# Patient Record
Sex: Male | Born: 1970
Health system: Southern US, Community
[De-identification: ages and names within clinical notes are randomized; demographics above are authoritative.]

## PROBLEM LIST (undated history)

## (undated) DIAGNOSIS — I10 Essential (primary) hypertension: Secondary | ICD-10-CM

## (undated) DIAGNOSIS — M25511 Pain in right shoulder: Secondary | ICD-10-CM

## (undated) DIAGNOSIS — E785 Hyperlipidemia, unspecified: Secondary | ICD-10-CM

## (undated) DIAGNOSIS — S134XXA Sprain of ligaments of cervical spine, initial encounter: Secondary | ICD-10-CM

## (undated) DIAGNOSIS — E669 Obesity, unspecified: Secondary | ICD-10-CM

## (undated) DIAGNOSIS — M25512 Pain in left shoulder: Secondary | ICD-10-CM

## (undated) DIAGNOSIS — E119 Type 2 diabetes mellitus without complications: Secondary | ICD-10-CM

## (undated) DIAGNOSIS — M549 Dorsalgia, unspecified: Secondary | ICD-10-CM

## (undated) DIAGNOSIS — M79671 Pain in right foot: Secondary | ICD-10-CM

## (undated) HISTORY — DX: Essential (primary) hypertension: I10

## (undated) HISTORY — DX: Type 2 diabetes mellitus without complications: E11.9

## (undated) HISTORY — DX: Pain in right foot: M79.671

## (undated) HISTORY — PX: OTHER SURGICAL HISTORY: SHX169

## (undated) HISTORY — DX: Dorsalgia, unspecified: M54.9

## (undated) HISTORY — DX: Hyperlipidemia, unspecified: E78.5

## (undated) HISTORY — DX: Obesity, unspecified: E66.9

## (undated) HISTORY — DX: Pain in left shoulder: M25.512

## (undated) HISTORY — PX: MOUTH SURGERY: SHX715

## (undated) HISTORY — DX: Sprain of ligaments of cervical spine, initial encounter: S13.4XXA

## (undated) HISTORY — DX: Pain in right shoulder: M25.511

---

## 2009-02-16 ENCOUNTER — Ambulatory Visit (HOSPITAL_COMMUNITY): Admission: RE | Admit: 2009-02-16 | Discharge: 2009-02-16 | Payer: Self-pay | Admitting: Family Medicine

## 2009-02-16 ENCOUNTER — Ambulatory Visit: Payer: Self-pay | Admitting: Family Medicine

## 2009-02-16 ENCOUNTER — Encounter: Payer: Self-pay | Admitting: Orthopedic Surgery

## 2009-02-16 DIAGNOSIS — M79609 Pain in unspecified limb: Secondary | ICD-10-CM | POA: Insufficient documentation

## 2009-02-16 DIAGNOSIS — E669 Obesity, unspecified: Secondary | ICD-10-CM | POA: Insufficient documentation

## 2009-02-16 DIAGNOSIS — N529 Male erectile dysfunction, unspecified: Secondary | ICD-10-CM

## 2009-02-16 DIAGNOSIS — M25519 Pain in unspecified shoulder: Secondary | ICD-10-CM

## 2009-02-16 DIAGNOSIS — M25511 Pain in right shoulder: Secondary | ICD-10-CM | POA: Insufficient documentation

## 2009-02-16 HISTORY — DX: Male erectile dysfunction, unspecified: N52.9

## 2009-02-16 LAB — CONVERTED CEMR LAB: Glucose, Bld: 79 mg/dL

## 2009-02-16 IMAGING — CR DG FOOT COMPLETE 3+V*R*
3 series · 3 of 3 positions shown · non-contrast
Comparison: None

CLINICAL DATA: Right foot and bilateral shoulder pain

RIGHT FOOT COMPLETE - 3+ VIEW

[view not recorded (1 of 3)]
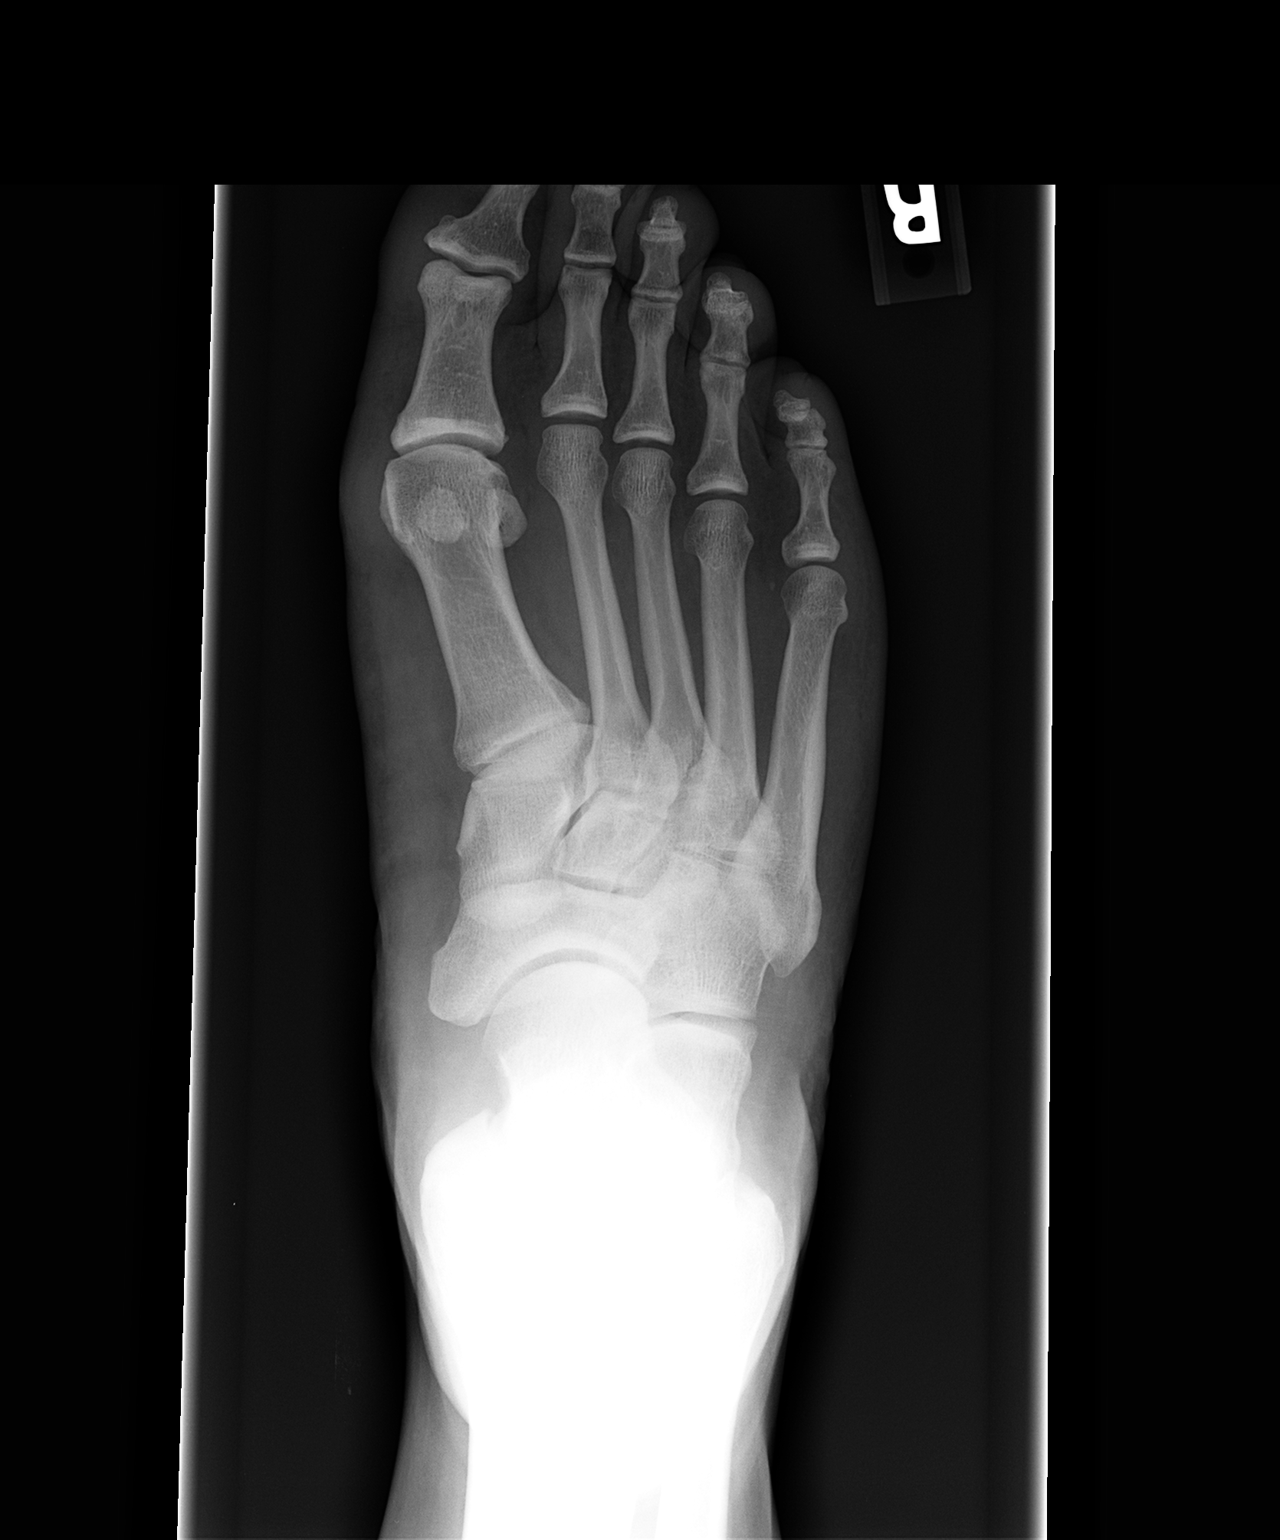

[view not recorded (2 of 3)]
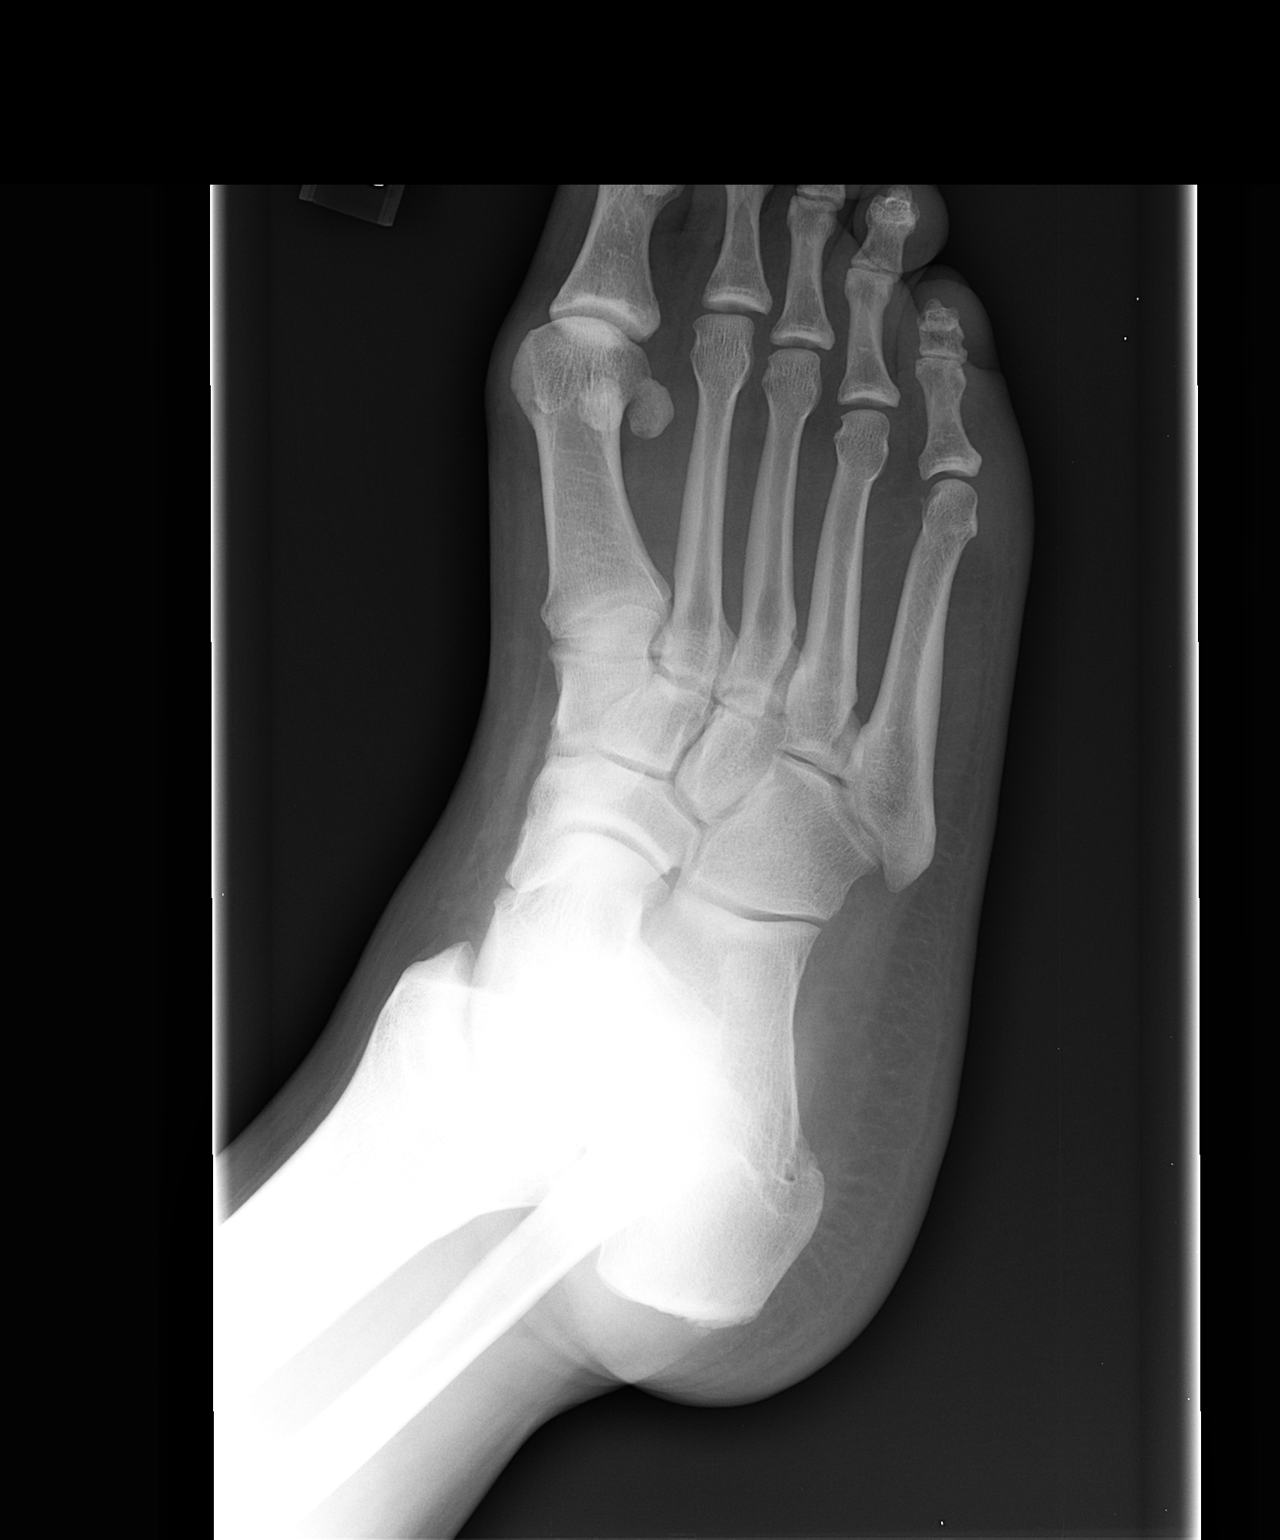

[view not recorded (3 of 3)]
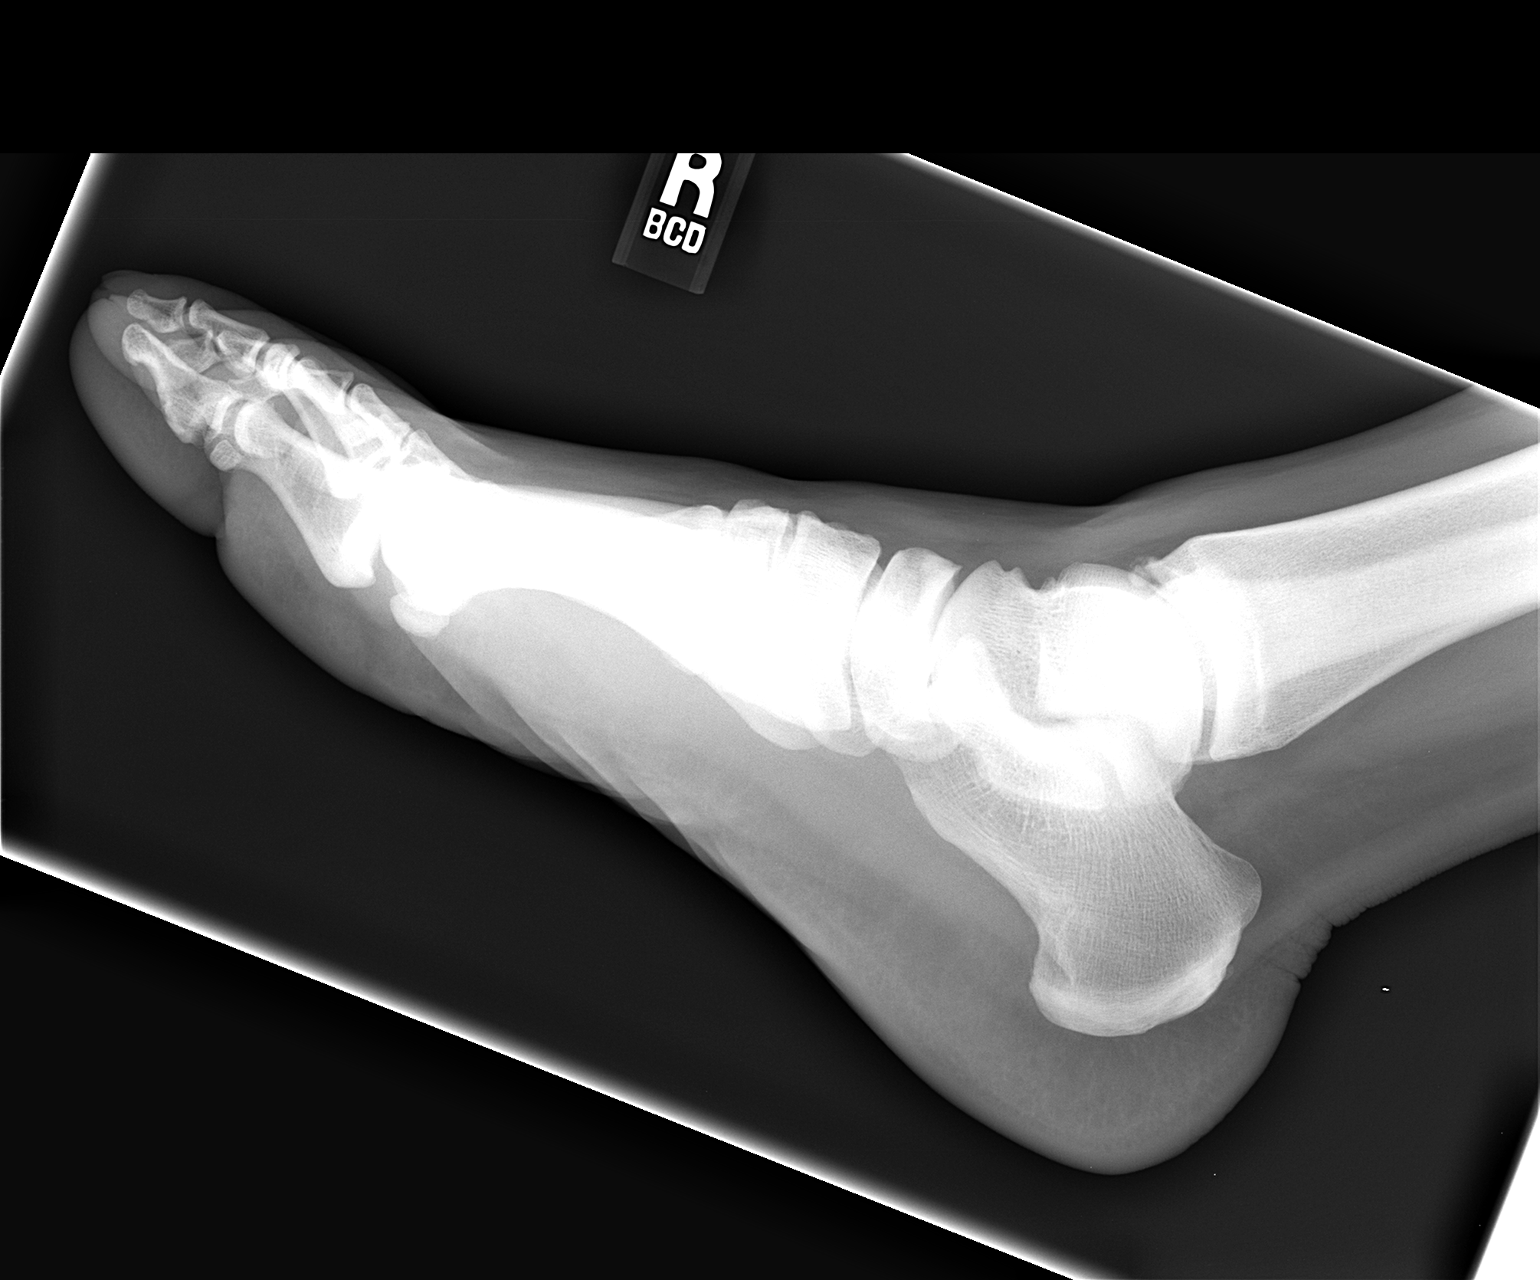

[3 of 3 positions shown; findings below may reference images not displayed]

FINDINGS: Bone mineralization normal.
Joint spaces preserved.
No acute fracture, dislocation, or bone destruction.
Minimal hallux valgus.
IMPRESSION: No acute abnormalities.

## 2009-02-16 IMAGING — CR DG SHOULDER 2+V BILAT
6 series · 6 of 6 positions shown · non-contrast
Comparison: None

CLINICAL DATA: Right foot and bilateral shoulder pain

BILATERAL SHOULDER - 2+ VIEW

[view not recorded (1 of 6)]
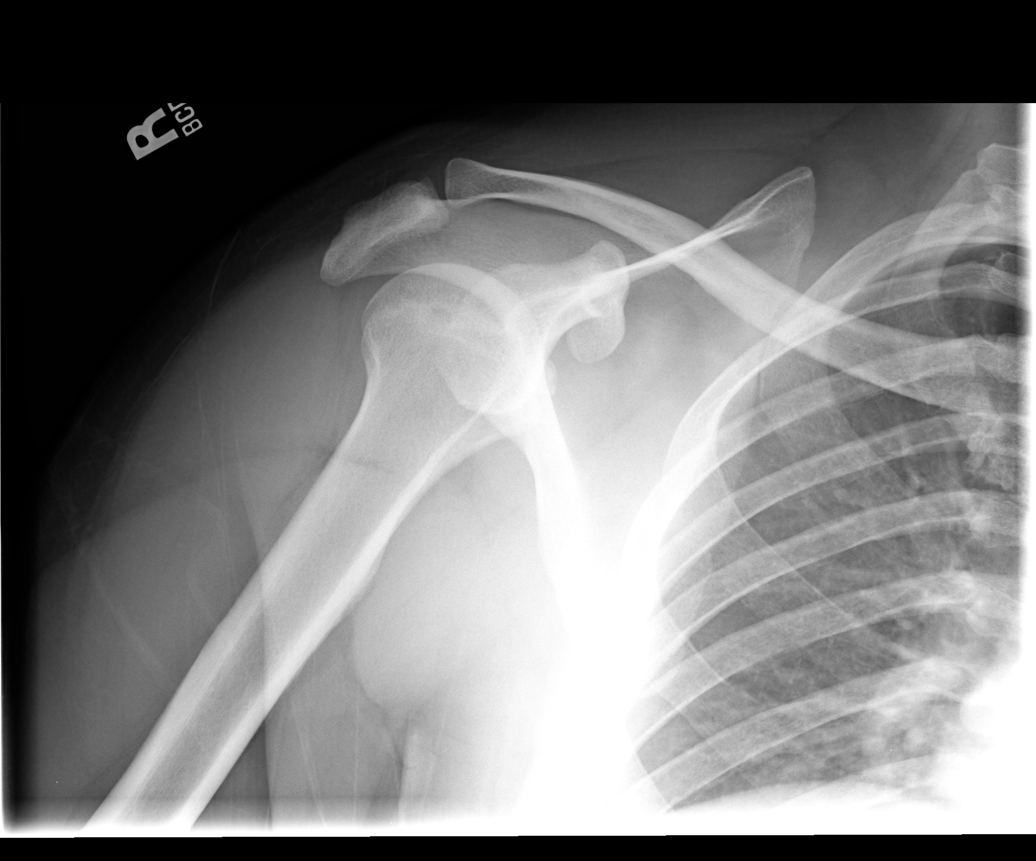

[view not recorded (2 of 6)]
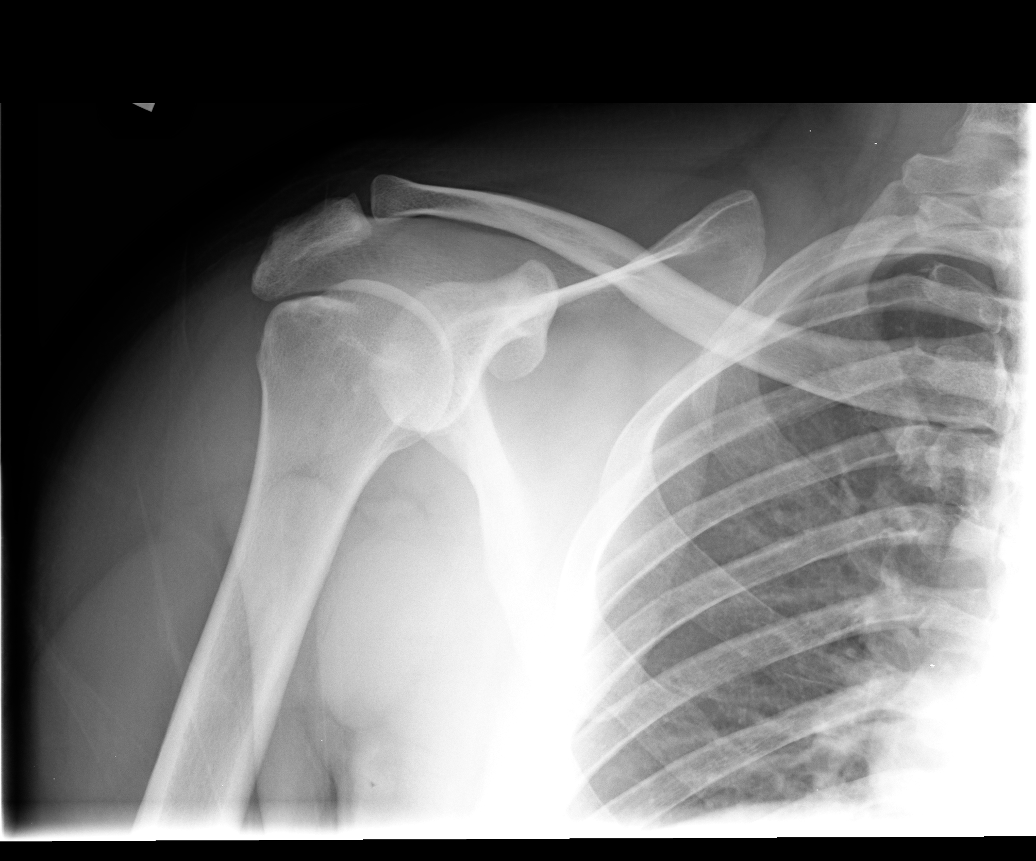

[view not recorded (3 of 6)]
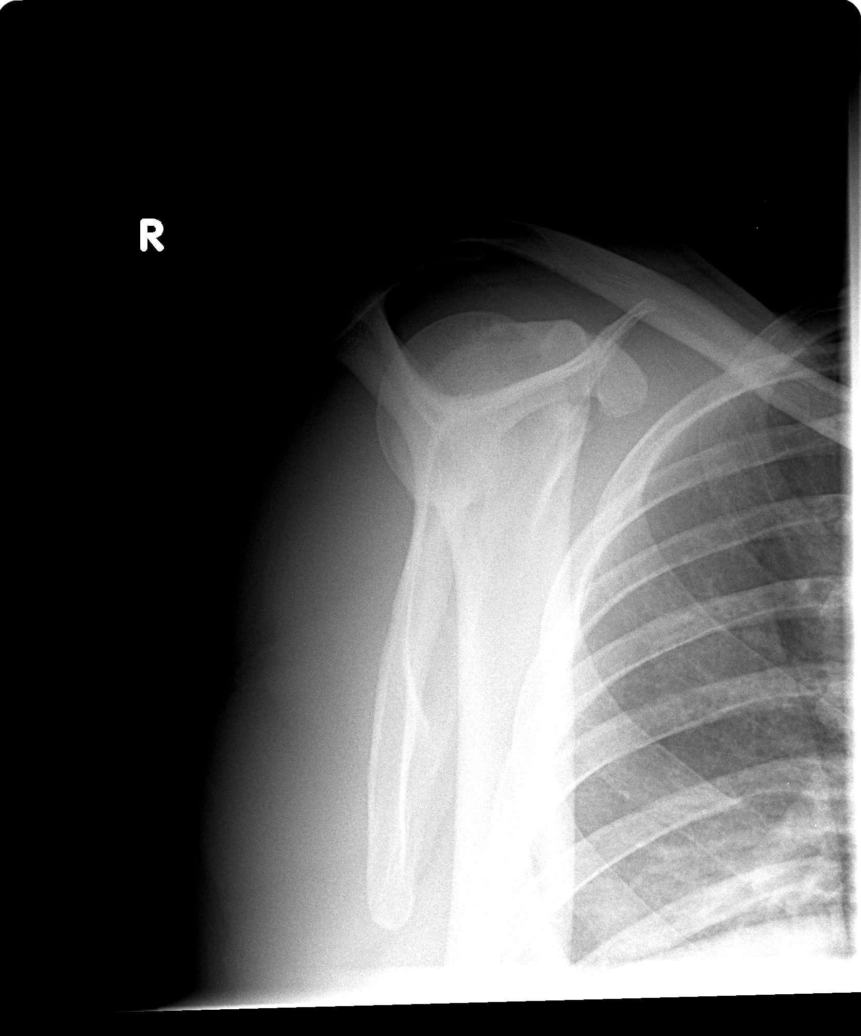

[view not recorded (4 of 6)]
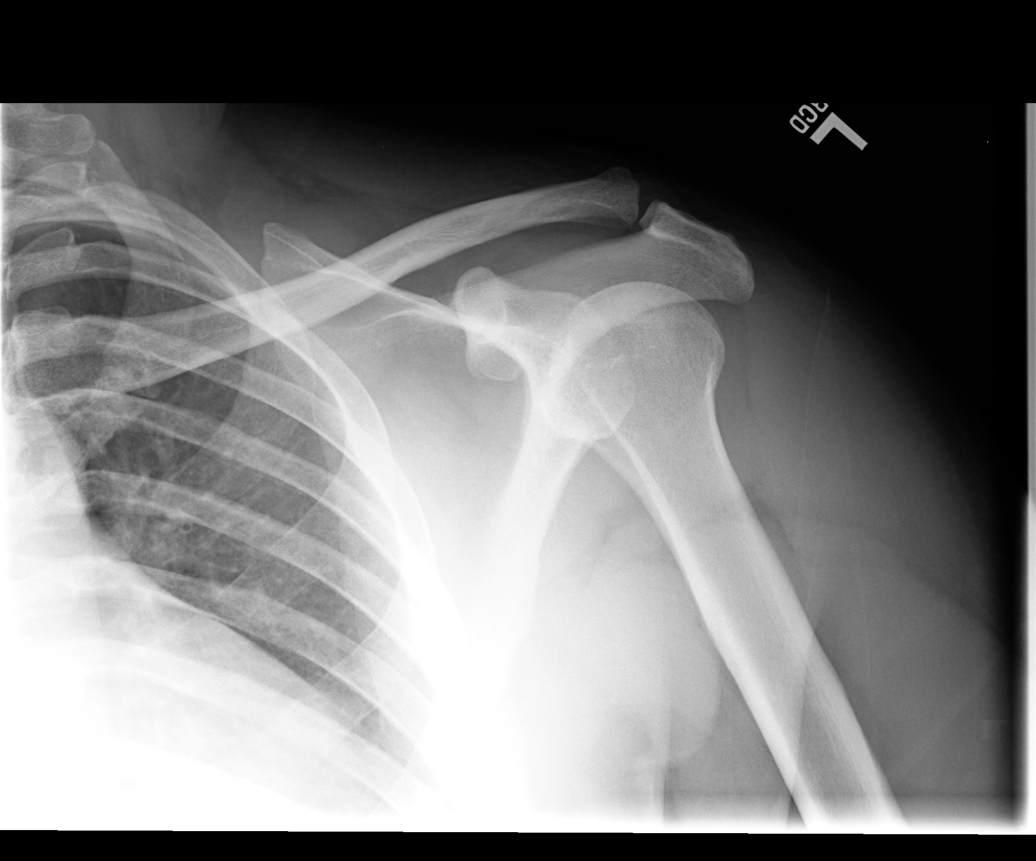

[view not recorded (5 of 6)]
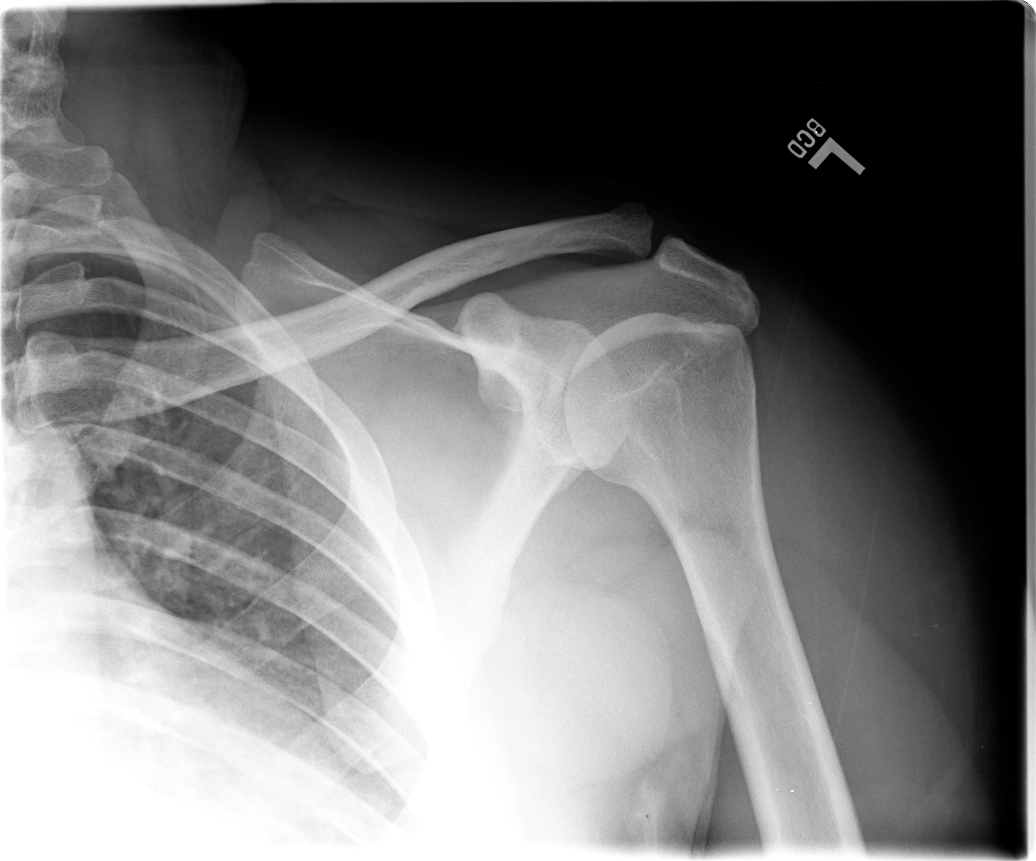

[view not recorded (6 of 6)]
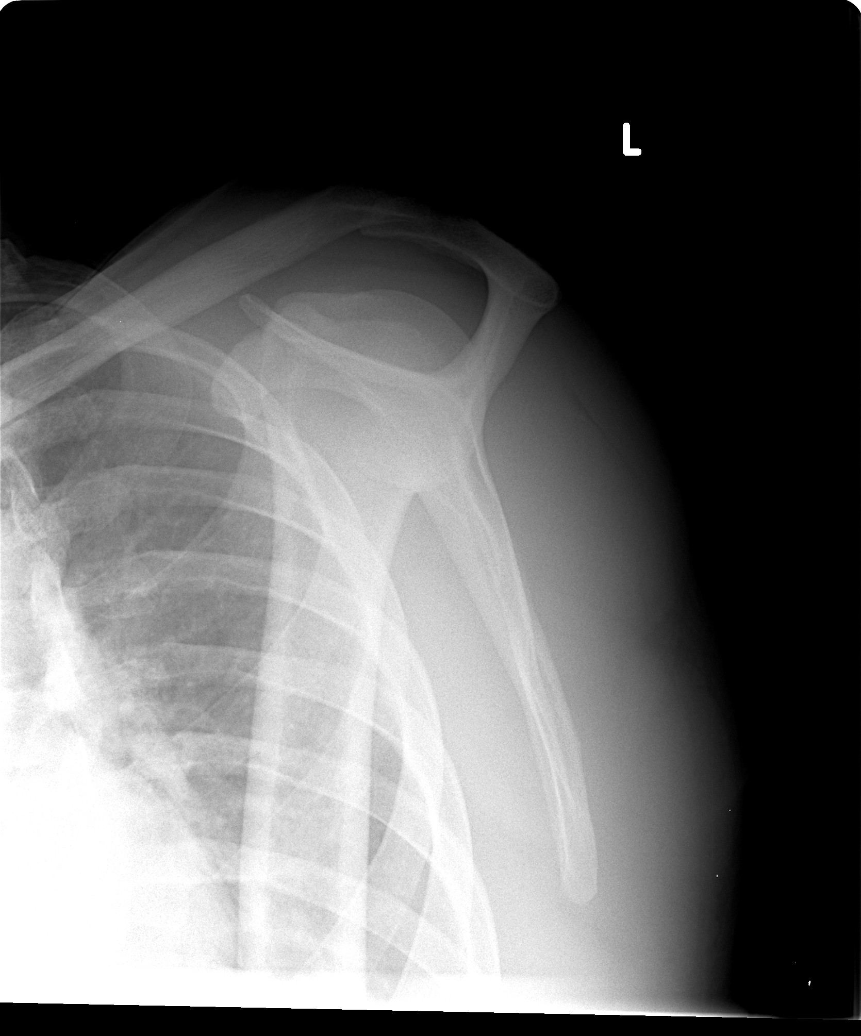

[6 of 6 positions shown; findings below may reference images not displayed]

FINDINGS: Right shoulder three views:
Lateral downsloping acromion.
Bone mineralization normal.
AC joint alignment normal.
No acute fracture, dislocation, or bone destruction.

Left shoulder three views:
Lateral downsloping of acromion.
Bone mineralization normal.
No acute fracture, dislocation, or bone destruction.
IMPRESSION: No acute bony abnormalities.

## 2009-02-18 ENCOUNTER — Encounter: Payer: Self-pay | Admitting: Family Medicine

## 2009-02-19 ENCOUNTER — Encounter: Payer: Self-pay | Admitting: Family Medicine

## 2009-02-23 ENCOUNTER — Encounter: Payer: Self-pay | Admitting: Family Medicine

## 2009-02-23 LAB — CONVERTED CEMR LAB
Basophils Absolute: 0 10*3/uL (ref 0.0–0.1)
Calcium: 9.8 mg/dL (ref 8.4–10.5)
Chloride: 103 meq/L (ref 96–112)
Cholesterol: 260 mg/dL — ABNORMAL HIGH (ref 0–200)
Creatinine, Ser: 1.03 mg/dL (ref 0.40–1.50)
Eosinophils Absolute: 0.1 10*3/uL (ref 0.0–0.7)
HDL: 39 mg/dL — ABNORMAL LOW (ref >39)
LDL Cholesterol: 187 mg/dL — ABNORMAL HIGH (ref 0–99)
Lymphs Abs: 1.5 10*3/uL (ref 0.7–4.0)
MCV: 82.2 fL (ref 78.0–100.0)
Neutrophils Relative %: 63 % (ref 43–77)
Platelets: 233 10*3/uL (ref 150–400)
RDW: 14.8 % (ref 11.5–15.5)
Triglycerides: 169 mg/dL — ABNORMAL HIGH (ref <150)
WBC: 6.4 10*3/uL (ref 4.0–10.5)

## 2009-03-02 ENCOUNTER — Telehealth: Payer: Self-pay | Admitting: Family Medicine

## 2009-03-02 LAB — CONVERTED CEMR LAB
ALT: 33 units/L (ref 0–53)
AST: 25 units/L (ref 0–37)
Alkaline Phosphatase: 43 units/L (ref 39–117)
Bilirubin, Direct: 0.1 mg/dL (ref 0.0–0.3)
Indirect Bilirubin: 0.4 mg/dL (ref 0.0–0.9)

## 2009-03-23 ENCOUNTER — Ambulatory Visit: Payer: Self-pay | Admitting: Family Medicine

## 2009-03-23 DIAGNOSIS — E785 Hyperlipidemia, unspecified: Secondary | ICD-10-CM

## 2009-03-23 DIAGNOSIS — E782 Mixed hyperlipidemia: Secondary | ICD-10-CM | POA: Insufficient documentation

## 2009-06-21 ENCOUNTER — Ambulatory Visit: Payer: Self-pay | Admitting: Family Medicine

## 2009-06-22 ENCOUNTER — Telehealth: Payer: Self-pay | Admitting: Family Medicine

## 2009-06-30 ENCOUNTER — Telehealth: Payer: Self-pay | Admitting: Family Medicine

## 2009-07-20 ENCOUNTER — Ambulatory Visit: Payer: Self-pay | Admitting: Orthopedic Surgery

## 2009-07-21 ENCOUNTER — Ambulatory Visit: Payer: Self-pay | Admitting: Family Medicine

## 2009-07-21 ENCOUNTER — Ambulatory Visit: Payer: Self-pay | Admitting: Orthopedic Surgery

## 2009-07-21 DIAGNOSIS — M25819 Other specified joint disorders, unspecified shoulder: Secondary | ICD-10-CM | POA: Insufficient documentation

## 2009-07-21 DIAGNOSIS — M7580 Other shoulder lesions, unspecified shoulder: Secondary | ICD-10-CM

## 2009-07-21 HISTORY — DX: Other specified joint disorders, unspecified shoulder: M25.819

## 2009-08-25 ENCOUNTER — Ambulatory Visit: Payer: Self-pay | Admitting: Family Medicine

## 2009-08-25 ENCOUNTER — Encounter: Payer: Self-pay | Admitting: Physician Assistant

## 2009-09-29 ENCOUNTER — Ambulatory Visit: Payer: Self-pay | Admitting: Family Medicine

## 2009-10-06 LAB — CONVERTED CEMR LAB
BUN: 12 mg/dL (ref 6–23)
CO2: 21 meq/L (ref 19–32)
Calcium: 9.6 mg/dL (ref 8.4–10.5)
Chloride: 105 meq/L (ref 96–112)
Cholesterol: 248 mg/dL — ABNORMAL HIGH (ref 0–200)
Creatinine, Ser: 1.01 mg/dL (ref 0.40–1.50)
Glucose, Bld: 91 mg/dL (ref 70–99)
Total Bilirubin: 0.6 mg/dL (ref 0.3–1.2)
Total CHOL/HDL Ratio: 6.7
Triglycerides: 94 mg/dL (ref <150)
VLDL: 19 mg/dL (ref 0–40)

## 2010-07-08 ENCOUNTER — Telehealth: Payer: Self-pay | Admitting: Family Medicine

## 2010-07-18 ENCOUNTER — Ambulatory Visit
Admission: RE | Admit: 2010-07-18 | Discharge: 2010-07-18 | Payer: Self-pay | Source: Home / Self Care | Attending: Family Medicine | Admitting: Family Medicine

## 2010-07-18 ENCOUNTER — Encounter: Payer: Self-pay | Admitting: Family Medicine

## 2010-07-18 DIAGNOSIS — E1165 Type 2 diabetes mellitus with hyperglycemia: Secondary | ICD-10-CM | POA: Insufficient documentation

## 2010-07-20 ENCOUNTER — Encounter: Payer: Self-pay | Admitting: Family Medicine

## 2010-07-27 LAB — CONVERTED CEMR LAB
BUN: 14 mg/dL (ref 6–23)
Chloride: 103 meq/L (ref 96–112)
Glucose, Bld: 122 mg/dL — ABNORMAL HIGH (ref 70–99)
Hgb A1c MFr Bld: 10.5 % — ABNORMAL HIGH (ref <5.7)
Potassium: 4.3 meq/L (ref 3.5–5.3)
Sodium: 137 meq/L (ref 135–145)
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
## 2010-08-09 NOTE — Assessment & Plan Note (Signed)
Summary: PHY   Allergies: No Known Drug Allergies   Complete Medication List: 1)  Lovastatin 40 Mg Tabs (Lovastatin) .... Take 1 tab by mouth at bedtime 2)  Vicodin 5-500 Mg Tabs (Hydrocodone-acetaminophen) .... Take 1 tablet by mouth two times a day as needed pt was not evaluated in the office on this day
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
## 2010-08-09 NOTE — Letter (Signed)
Summary: Out of Work  [facility]  [address]   [place], [place] [address]   Phone: [phone]  Fax: [phone]    August 25, 2009   Employee:  JORDI LACKO    To Whom It May Concern:   For Medical reasons, please excuse the above named employee from work for the following dates:  Start:   08/23/09  End:   08/27/09 may return to work without restrictions.  If you need additional information, please feel free to contact our office.         Sincerely,    Esperanza Sheets PA

## 2010-08-09 NOTE — Letter (Signed)
Summary: History form  History form   Imported By: Jacklynn Ganong 07/26/2009 11:40:01  _____________________________________________________________________  External Attachment:    Type:   Image     Comment:   External Document

## 2010-08-09 NOTE — Assessment & Plan Note (Signed)
Summary: BILAT SHOULDER PAIN LT>RT/?NEW XRAYS/REF M.SIMPSON/[employer]   Vital Signs:  Patient profile:   40 year old male Weight:      234 pounds  Visit Type:  Initial     Primary Provider:  simpson, margaret, md  CC:  left shoulder pain.  History of Present Illness: Dr. Syliva Overman has referred the patient to [place] with LEFT and RIGHT shoulder pain for 6 months with no injury.  The LEFT is worse than the RIGHT.  There is painful range of motion and pain at night.  There's a lot of stiffness and popping but no catching or locking he describes a severe pain with burning but no numbness.  The pain radiates from the shoulders into the deltoid.  Note neck pain at this time.  Review of systems is negative for weight loss chest pain shortness of breath nausea hematuria dizziness weakness polydipsia polyphagia anxiety eczema or vision seasonal ALLERGIES or lymph node disease.  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  He smokes once in a while K. she will use of alcohol drinks a lot of coffee.  He is currently enrolled to get his GED.    Allergies: No Known Drug Allergies  Physical Exam  Msk:  Vital signs are stable weight was 234 pulse is 80 respiratory rate was 16  He is awake alert and ointment x3 his appearance was of a large male muscular build.  His mood and affect are normal.  Normal gait and station.  He had normal pulses in his upper extremities with no lymphedema or lymph nodes.  Upper extremity sensation was normal reflexes were 2+ and equal at the elbows and wrist.  Shoulder examination revealed bilateral tenderness over the posterior subacromial space anterolateral acromion and deltoid.  Full range of motion passively and actively with painful range of motion in the arc of 120 up to 180.  A positive  impingement sign.  Shoulders were stable his rotator cuff was normal in strength     Impression & Recommendations:  Problem # 1:  IMPINGEMENT SYNDROME (ICD-726.2) Assessment New  bilateral shoulder injections first on the RIGHT Verbal consent obtained/The shoulder was injected with depomedrol 40mg /cc and sensorcaine .25% . There were no complications  Then on the LEFT  Verbal consent obtained/The shoulder was injected with depomedrol 40mg /cc and sensorcaine .25% . There were no complications   X-rays of the more symptomatic LEFT shoulder  Normal glenohumeral joint normal acromion  Impression normal shoulder  Orders: Est. Patient Level IV (91478) Joint Aspirate / Injection, Large (20610) Depo- Medrol 40mg  (J1030)  Patient Instructions: 1)  You have received an injection of cortisone today. You may experience increased pain at the injection site. Apply ice pack to the area for 20 minutes every 2 hours and take 2 xtra strength tylenol every 8 hours. This increased pain will usually resolve in 24 hours. The injection will take effect in 3-10 days.  2)  rest 3)  Limit activity to comfort and avoid activities that increase discomfort.  Apply moist heat and/or ice to shoulder as needed

## 2010-08-11 NOTE — Letter (Signed)
Summary: DOSE INCREASE  DOSE INCREASE   Imported By: Lind Guest 07/18/2010 16:37:26  _____________________________________________________________________  External Attachment:    Type:   Image     Comment:   External Document

## 2010-08-11 NOTE — Letter (Signed)
Summary: [facility] patient instructions  [facility] patient instructions   Imported By: Lind Guest 07/20/2010 09:11:35  _____________________________________________________________________  External Attachment:    Type:   Image     Comment:   External Document

## 2010-08-11 NOTE — Assessment & Plan Note (Signed)
Summary: f up from ed found out he has diab.   Vital Signs:  Patient profile:   40 year old male Height:      68 inches Weight:      243.25 pounds BMI:     37.12 O2 Sat:      97 % Pulse rate:   87 / minute Pulse rhythm:   regular Resp:     16 per minute BP sitting:   104 / 74  (left arm) Cuff size:   large  Vitals Entered By: Everitt Amber LPN (July 18, 2010 11:02 AM)  Nutrition Counseling: Patient's BMI is greater than 25 and therefore counseled on weight management options. CC: wentt o [facility] ER on 12/30 and found out he was a diabetic   Primary Care Provider:  simpson, margaret, md  CC:  wentt o [facility] ER on 12/30 and found out he was a diabetic.  History of Present Illness: Pt recently dx as new diabetic in the Ed, he had polyuria, polydypsia, blurred vision and dry mouth. he has been a known prediabetic for some time, but has not f/u as recommended.  Current Medications (verified): 1)  Metformin Hcl 500 Mg Tabs (Metformin Hcl) .... Take 1 Tablet By Mouth Two Times A Day  Allergies (verified): No Known Drug Allergies  Review of Systems      See HPI General:  Complains of fatigue; denies chills and fever. Eyes:  Complains of blurring. ENT:  Denies hoarseness, nasal congestion, postnasal drainage, and sinus pressure. CV:  Denies chest pain or discomfort, palpitations, and swelling of feet. Resp:  Denies cough and sputum productive. GI:  Denies abdominal pain, constipation, diarrhea, nausea, and vomiting. GU:  Complains of nocturia and urinary frequency; denies dysuria. MS:  Denies joint pain, low back pain, mid back pain, and stiffness. Neuro:  Denies difficulty with concentration, disturbances in coordination, headaches, and memory loss. Psych:  Denies anxiety and depression. Endo:  Complains of excessive thirst and excessive urination. Heme:  Denies abnormal bruising and bleeding.  Physical Exam  General:  Well-developed,obese,in no  acute distress; alert,appropriate and cooperative throughout examination HEENT: No facial asymmetry,  EOMI, No sinus tenderness, TM's Clear, oropharynx  pink and moist.   Chest: Clear to auscultation bilaterally.  CVS: S1, S2, No murmurs, No S3.   Abd: Soft, Nontender.  MS: Adequate ROM spine, hips, shoulders and knees.  Ext: No edema.   CNS: CN 2-12 intact, power tone and sensation normal throughout.   Skin: Intact, no visible lesions or rashes.  Psych: Good eye contact, normal affect.  Memory intact, not anxious or depressed appearing.    Impression & Recommendations:  Problem # 1:  DIABETES MELLITUS, TYPE II (ICD-250.00) Assessment Comment Only  His updated medication list for this problem includes:    Metformin Hcl 1000 Mg Tabs (Metformin hcl) .Marland Kitchen... Take 1 tablet by mouth two times a day    Glipizide 5 Mg Xr24h-tab (Glipizide) .Marland Kitchen... Take 1 tablet by mouth once a day new diabetic, needs HBA1C and educated in the office re testing and diet  Problem # 2:  HYPERLIPIDEMIA (ICD-272.4) Assessment: Comment Only  The following medications were removed from the medication list:    Crestor 20 Mg Tabs (Rosuvastatin calcium) .Marland Kitchen... Take 1 daily for cholesterol  Labs Reviewed: SGOT: 25 (09/29/2009)   SGPT: 27 (09/29/2009)   HDL:37 (09/29/2009), 39 (02/18/2009)  LDL:192 (09/29/2009), 187 (02/18/2009)  Chol:248 (09/29/2009), 260 (02/18/2009)  Trig:94 (09/29/2009), 169 (02/18/2009) Low fat dietdiscussed and  encouraged Needs rept labs  Complete Medication List: 1)  Metformin Hcl 1000 Mg Tabs (Metformin hcl) .... Take 1 tablet by mouth two times a day 2)  Glipizide 5 Mg Xr24h-tab (Glipizide) .... Take 1 tablet by mouth once a day  Other Orders: T-Basic Metabolic Panel [id number]) T- Hemoglobin A1C ([id number]) Glucose, (CBG) (95284)  Patient Instructions: 1)  F/U in 6 weeks 2)  Check your blood sugars regularly. If your readings are usually above 250 or below 70 you should contact  our office. 3)  It is important that you exercise regularly at least 20 minutes 5 times a week. If you develop chest pain, have severe difficulty breathing, or feel very tired , stop exercising immediately and seek medical attention. 4)  You need to lose weight. Consider a lower calorie diet and regular exercise.  Prescriptions: GLIPIZIDE 5 MG XR24H-TAB (GLIPIZIDE) Take 1 tablet by mouth once a day  #30 x 0   Entered and Authorized by:   Syliva Overman MD   Signed by:   Syliva Overman MD on 07/18/2010   Method used:   Electronically to        [facility] (retail)       [address]       [place], [place]  [address]       Ph: [phone]       Fax: [phone]   RxID:   [id number] METFORMIN HCL 1000 MG TABS (METFORMIN HCL) Take 1 tablet by mouth two times a day  #60 x 3   Entered and Authorized by:   Syliva Overman MD   Signed by:   Syliva Overman MD on 07/18/2010   Method used:   Printed then faxed to ...       [facility] (retail)       [address]       [place], [place]  [address]       Ph: [phone]       Fax: [phone]   RxID:   [id number]    Orders Added: 1)  Est. Patient Level III [76160] 2)  T-Basic Metabolic Panel [phone] 3)  T- Hemoglobin A1C [83036-23375] 4)  Glucose, (CBG) [82962]    Laboratory Results   Blood Tests   Date/Time Received: July 18, 2010 12:04 PM   Date/Time Reported: July 18, 2010 12:04 PM   Glucose (random): 186 mg/dL   (Normal Range: 85-462)

## 2010-08-11 NOTE — Progress Notes (Signed)
  Phone Note Call from Patient   Summary of Call: Patients wife called in and states patient has been using the bathroom a lot here lately.  She states his fingers feel knumb as well.  She also states heart problems, and diabeties run in his family.  I advised patient to take husband to urgent care or ED. Initial call taken by: Curtis Sites,  July 08, 2010 8:10 AM  Follow-up for Phone Call        noted and i agree Follow-up by: Adella Hare LPN,  July 08, 2010 2:21 PM

## 2010-09-01 ENCOUNTER — Encounter: Payer: Self-pay | Admitting: Family Medicine

## 2010-09-01 ENCOUNTER — Ambulatory Visit (INDEPENDENT_AMBULATORY_CARE_PROVIDER_SITE_OTHER): Payer: Self-pay | Admitting: Family Medicine

## 2010-09-01 DIAGNOSIS — E119 Type 2 diabetes mellitus without complications: Secondary | ICD-10-CM

## 2010-09-01 DIAGNOSIS — E669 Obesity, unspecified: Secondary | ICD-10-CM

## 2010-09-01 LAB — CONVERTED CEMR LAB: Glucose, Bld: 123 mg/dL

## 2010-09-15 NOTE — Assessment & Plan Note (Signed)
Summary: F UP 6 WEEKS   Vital Signs:  Patient profile:   40 year old male Height:      68 inches Weight:      240 pounds BMI:     36.62 O2 Sat:      98 % Pulse rate:   74 / minute Pulse rhythm:   regular Resp:     16 per minute BP sitting:   110 / 78  (left arm) Cuff size:   large  Vitals Entered By: Everitt Amber LPN (September 01, 2010 9:27 AM)  Nutrition Counseling: Patient's BMI is greater than 25 and therefore counseled on weight management options. CC: Follow up chronic problems   Primary Care Provider:  simpson, margaret, md  CC:  Follow up chronic problems.  History of Present Illness: Reports  that the is doing much bette. He is following a reduced carb diet,taking meds as prescribed , and his fasting blood suagars are being tesgted daily and are sel;dom over 120 Denies recent fever or chills. Denies sinus pressure, nasal congestion , ear pain or sore throat. Denies chest congestion, or cough productive of sputum. Denies chest pain, palpitations, PND, orthopnea or leg swelling. Denies abdominal pain, nausea, vomitting, diarrhea or constipation. 'Denies change in bowel movements or bloody stool. Denies dysuria , frequency, incontinence or hesitancy.  Denies headaches, vertigo, seizures. Denies depression, anxiety or insomnia. Denies  rash, lesions, or itch.     Current Medications (verified): 1)  Metformin Hcl 1000 Mg Tabs (Metformin Hcl) .... Take 1 Tablet By Mouth Two Times A Day 2)  Glipizide 5 Mg Xr24h-Tab (Glipizide) .... Take 1 Tablet By Mouth Once A Day  Allergies (verified): No Known Drug Allergies  Review of Systems      See HPI General:  Complains of fatigue. Eyes:  Complains of blurring; improved with improved blood sugar control. GU:  Complains of erectile dysfunction. MS:  Complains of joint pain and stiffness; intermittent shoulder pain. Endo:  Denies excessive hunger, excessive thirst, and excessive urination; tests daily fastings, states  they range from the 80's to 90's, out of glipizide since saturday, no sugar lows .  Physical Exam  General:  Well-developed,well-nourished,in no acute distress; alert,appropriate and cooperative throughout examination HEENT: No facial asymmetry,  EOMI, No sinus tenderness, TM's Clear, oropharynx  pink and moist.   Chest: Clear to auscultation bilaterally.  CVS: S1, S2, No murmurs, No S3.   Abd: Soft, Nontender.  MS: Adequate ROM spine, hips, shoulders and knees.  Ext: No edema.   CNS: CN 2-12 intact, power tone and sensation normal throughout.   Skin: Intact, no visible lesions or rashes.  Psych: Good eye contact, normal affect.  Memory intact, not anxious or depressed appearing.    Impression & Recommendations:  Problem # 1:  DIABETES MELLITUS, TYPE II (ICD-250.00) Assessment Improved  The following medications were removed from the medication list:    Metformin Hcl 1000 Mg Tabs (Metformin hcl) .Marland Kitchen... Take 1 tablet by mouth two times a day    Glipizide 5 Mg Xr24h-tab (Glipizide) .Marland Kitchen... Take 1 tablet by mouth once a day His updated medication list for this problem includes:    Metformin Hcl 1000 Mg Tabs (Metformin hcl) .Marland Kitchen... Take 1 tablet by mouth two times a day    Glipizide 5 Mg Xr24h-tab (Glipizide) .Marland Kitchen... Take 1 tablet by mouth once a day Patient advised to reduce carbs and sweets, commit to regular physical activity, take meds as prescribed, test blood sugars as directed, and attempt  to lose weight , to improve blood sugar control.  Orders: Glucose, (CBG) (82962) T- Hemoglobin A1C [phone]) T- Hemoglobin A1C (09811-91478)  Problem # 2:  HYPERLIPIDEMIA (ICD-272.4) Assessment: Comment Only  Labs Reviewed: SGOT: 25 (09/29/2009)   SGPT: 27 (09/29/2009)   HDL:37 (09/29/2009), 39 (02/18/2009)  LDL:192 (09/29/2009), 187 (02/18/2009)  Chol:248 (09/29/2009), 260 (02/18/2009)  Trig:94 (09/29/2009), 169 (02/18/2009) Low fat dietdiscussed and encouraged pt needs meds,but will  need to wait on a more recentpanel to prescribe  Problem # 3:  OBESITY, UNSPECIFIED (ICD-278.00) Assessment: Unchanged  Ht: 68 (09/01/2010)   Wt: 240 (09/01/2010)   BMI: 36.62 (09/01/2010) therapeutic lifestyle change discussed and encouraged  Complete Medication List: 1)  Metformin Hcl 1000 Mg Tabs (Metformin hcl) .... Take 1 tablet by mouth two times a day 2)  Glipizide 5 Mg Xr24h-tab (Glipizide) .... Take 1 tablet by mouth once a day  Other Orders: T-Basic Metabolic Panel [id number]) T-Basic Metabolic Panel [id number])  Patient Instructions: 1)  f/u early august 2)  your blood sugars seem to be doing well. 3)  continue the same meds. 4)  Pls commit daily physical activity for 30 minutes for your health 5)  hBA1C and chem 7 non fasting april 20 or after. 6)  hbaic and chem 7 non fasting , early August , just before follow up Prescriptions: GLIPIZIDE 5 MG XR24H-TAB (GLIPIZIDE) Take 1 tablet by mouth once a day  #30 x 6   Entered and Authorized by:   Syliva Overman MD   Signed by:   Syliva Overman MD on 09/01/2010   Method used:   Electronically to        [facility] [address] (retail)       [address]       [place], [place]  [address]       Ph: [phone]       Fax: [phone]   RxID:   [id number] METFORMIN HCL 1000 MG TABS (METFORMIN HCL) Take 1 tablet by mouth two times a day  #60 x 6   Entered and Authorized by:   Syliva Overman MD   Signed by:   Syliva Overman MD on 09/01/2010   Method used:   Electronically to        [facility] (retail)       [address]       [place], [place]  [address]       Ph: [phone]       Fax: [phone]   RxID:   [id number]    Orders Added: 1)  Est. Patient Level III [15176] 2)  Glucose, (CBG) [82962] 3)  T-Basic Metabolic Panel [[id number]] 4)  T- Hemoglobin A1C [83036-23375] 5)  T-Basic Metabolic Panel [[id number]] 6)  T- Hemoglobin A1C  [83036-23375]    Laboratory Results   Blood Tests     Glucose (random): 123 mg/dL   (Normal Range: 16-073)

## 2011-02-09 ENCOUNTER — Ambulatory Visit: Payer: Self-pay | Admitting: Family Medicine

## 2012-05-13 ENCOUNTER — Emergency Department (HOSPITAL_COMMUNITY): Payer: Self-pay

## 2012-05-13 ENCOUNTER — Emergency Department (HOSPITAL_COMMUNITY)
Admission: EM | Admit: 2012-05-13 | Discharge: 2012-05-13 | Disposition: A | Discharge status: Home / Self Care | Payer: Self-pay | Attending: Emergency Medicine | Admitting: Emergency Medicine

## 2012-05-13 ENCOUNTER — Encounter (HOSPITAL_COMMUNITY): Payer: Self-pay

## 2012-05-13 DIAGNOSIS — R079 Chest pain, unspecified: Secondary | ICD-10-CM | POA: Insufficient documentation

## 2012-05-13 DIAGNOSIS — F17200 Nicotine dependence, unspecified, uncomplicated: Secondary | ICD-10-CM | POA: Insufficient documentation

## 2012-05-13 DIAGNOSIS — S0003XA Contusion of scalp, initial encounter: Secondary | ICD-10-CM | POA: Insufficient documentation

## 2012-05-13 DIAGNOSIS — S1093XA Contusion of unspecified part of neck, initial encounter: Secondary | ICD-10-CM | POA: Insufficient documentation

## 2012-05-13 DIAGNOSIS — S0093XA Contusion of unspecified part of head, initial encounter: Secondary | ICD-10-CM

## 2012-05-13 DIAGNOSIS — Z87828 Personal history of other (healed) physical injury and trauma: Secondary | ICD-10-CM | POA: Insufficient documentation

## 2012-05-13 DIAGNOSIS — E119 Type 2 diabetes mellitus without complications: Secondary | ICD-10-CM | POA: Insufficient documentation

## 2012-05-13 DIAGNOSIS — Y9389 Activity, other specified: Secondary | ICD-10-CM | POA: Insufficient documentation

## 2012-05-13 DIAGNOSIS — Y929 Unspecified place or not applicable: Secondary | ICD-10-CM | POA: Insufficient documentation

## 2012-05-13 DIAGNOSIS — W208XXA Other cause of strike by thrown, projected or falling object, initial encounter: Secondary | ICD-10-CM | POA: Insufficient documentation

## 2012-05-13 LAB — TROPONIN I: Troponin I: <0.3 ng/mL (ref <0.30)

## 2012-05-13 LAB — COMPREHENSIVE METABOLIC PANEL
Alkaline Phosphatase: 62 U/L (ref 39–117)
BUN: 12 mg/dL (ref 6–23)
CO2: 24 mEq/L (ref 19–32)
Chloride: 97 mEq/L (ref 96–112)
Creatinine, Ser: 0.81 mg/dL (ref 0.50–1.35)
GFR calc Af Amer: >90 mL/min (ref >90)
GFR calc non Af Amer: >90 mL/min (ref >90)
Glucose, Bld: 366 mg/dL — ABNORMAL HIGH (ref 70–99)
Potassium: 4 mEq/L (ref 3.5–5.1)
Total Bilirubin: 0.4 mg/dL (ref 0.3–1.2)

## 2012-05-13 LAB — GLUCOSE, CAPILLARY: Glucose-Capillary: 275 mg/dL — ABNORMAL HIGH (ref 70–99)

## 2012-05-13 LAB — CBC WITH DIFFERENTIAL/PLATELET
Basophils Relative: 0 % (ref 0–1)
HCT: 41.6 % (ref 39.0–52.0)
Hemoglobin: 14.4 g/dL (ref 13.0–17.0)
Lymphs Abs: 2.8 10*3/uL (ref 0.7–4.0)
MCH: 27.7 pg (ref 26.0–34.0)
MCHC: 34.6 g/dL (ref 30.0–36.0)
Monocytes Absolute: 0.3 10*3/uL (ref 0.1–1.0)
Monocytes Relative: 6 % (ref 3–12)
Neutro Abs: 2.5 10*3/uL (ref 1.7–7.7)
Neutrophils Relative %: 43 % (ref 43–77)
RBC: 5.19 MIL/uL (ref 4.22–5.81)

## 2012-05-13 IMAGING — CR DG CHEST 2V
2 series · 2 of 2 positions shown · non-contrast
Comparison: [DATE]

CLINICAL DATA: Left-sided chest pain, shortness of breath.

CHEST - 2 VIEW

[view not recorded (1 of 2)]
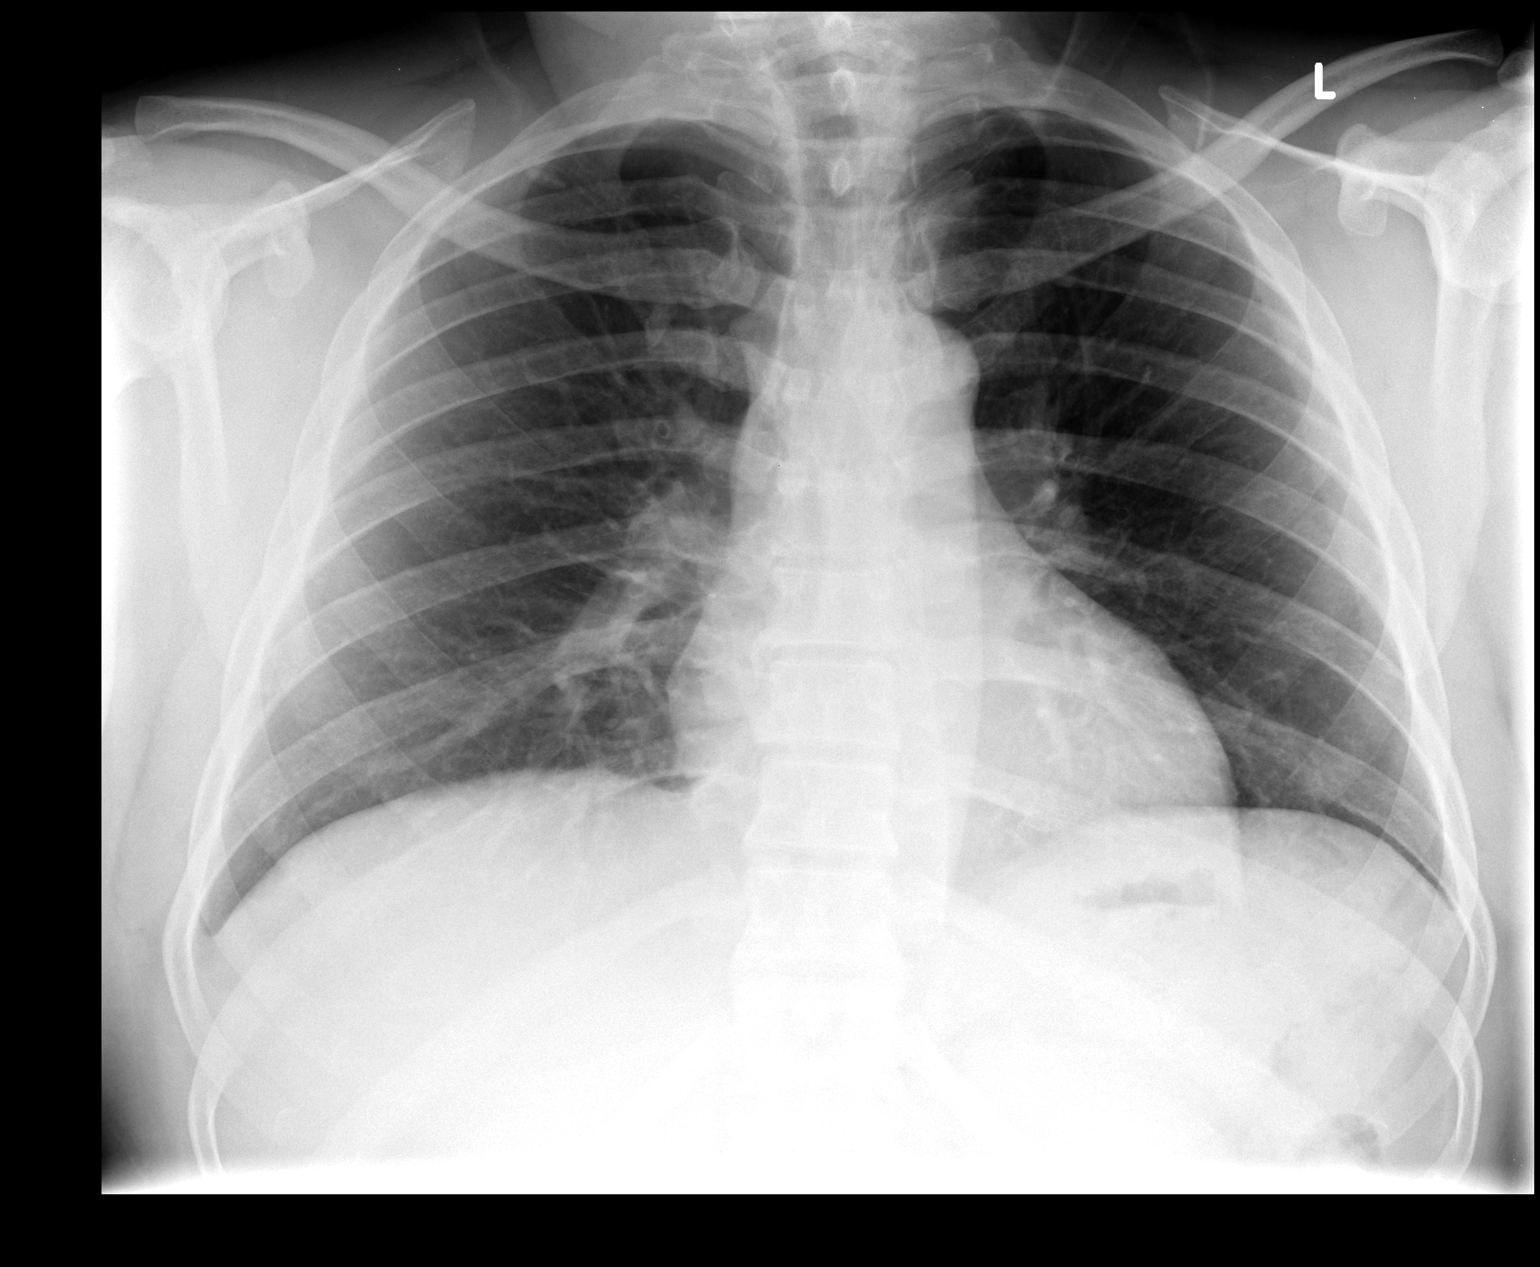

[view not recorded (2 of 2)]
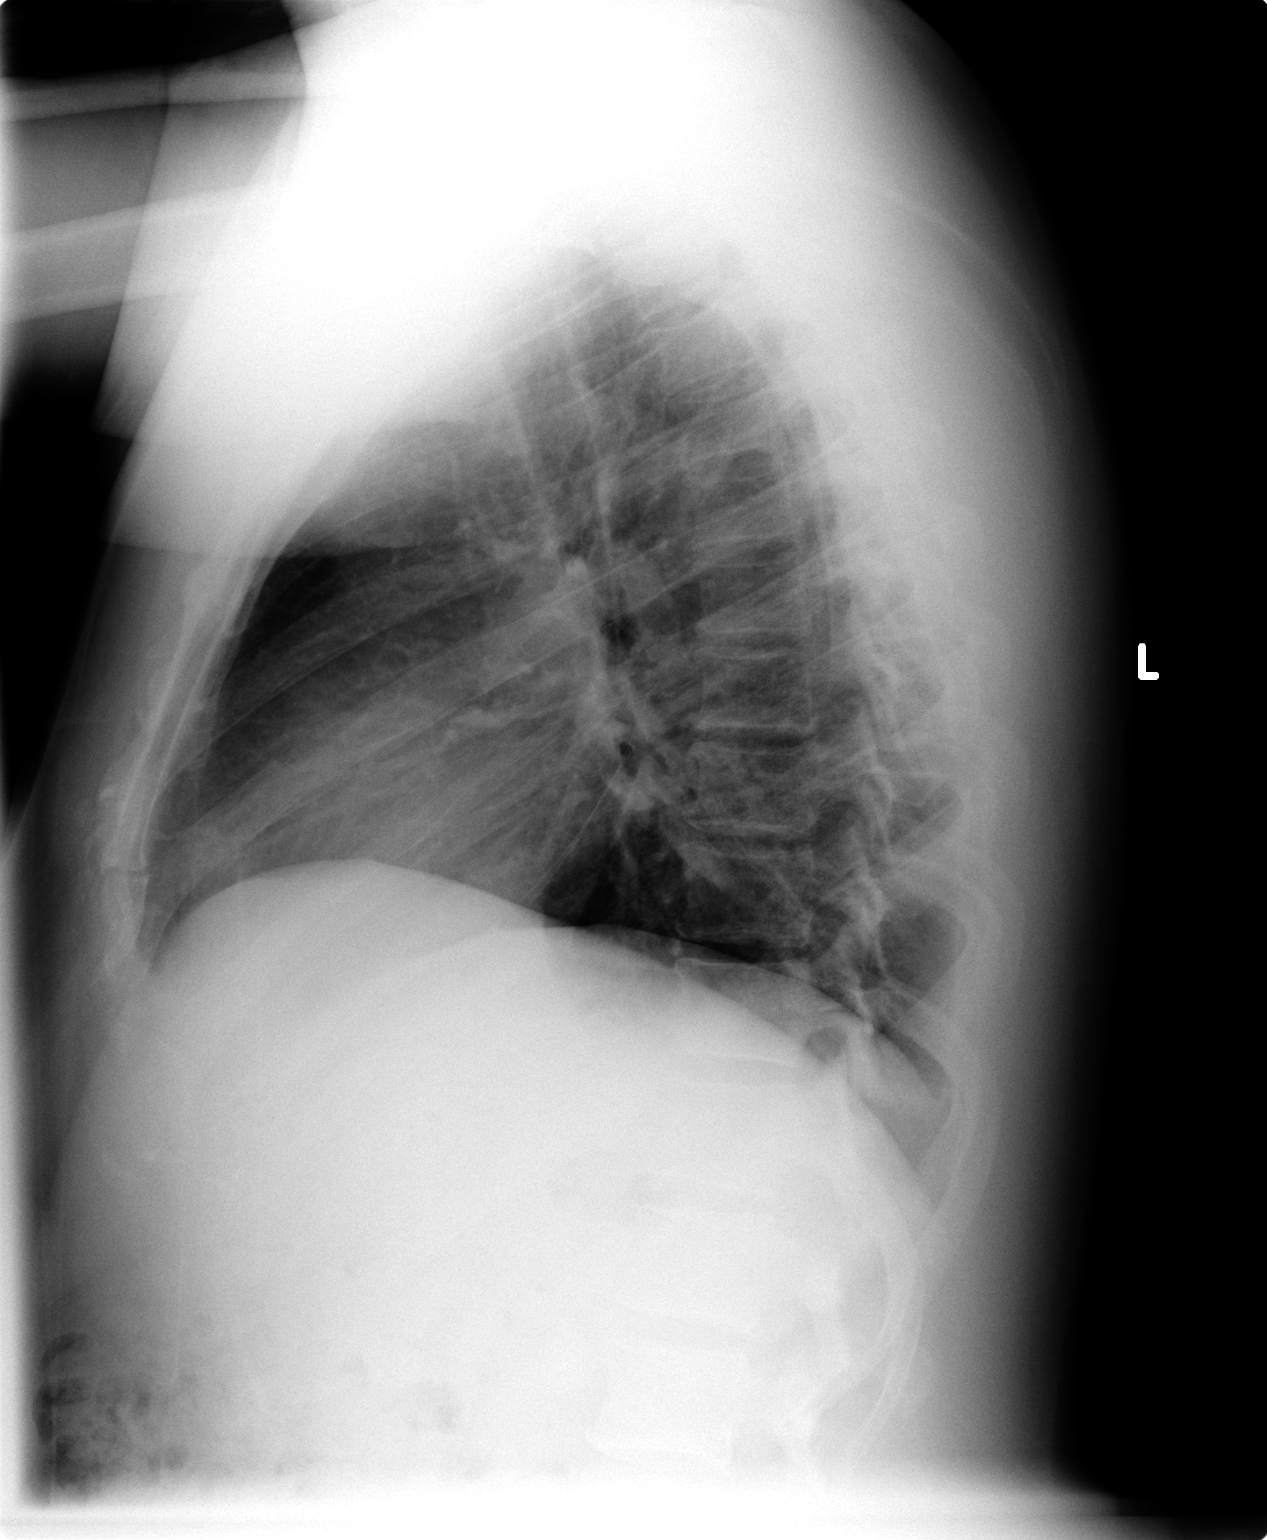

[2 of 2 positions shown; findings below may reference images not displayed]

FINDINGS: Lungs are clear. No pleural effusion or pneumothorax. The
cardiomediastinal contours are within normal limits. The visualized
bones and soft tissues are without significant appreciable
abnormality.
IMPRESSION: No radiographic evidence of acute cardiopulmonary process.
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
## 2012-05-13 MED ORDER — METFORMIN HCL 500 MG PO TABS: 500.0000 mg | ORAL_TABLET | Freq: Every day | ORAL | Status: DC

## 2012-05-13 MED ORDER — SODIUM CHLORIDE 0.9 % IV BOLUS (SEPSIS)
1000.0000 mL | Freq: Once | INTRAVENOUS | Status: AC
  Administered 2012-05-13: 1000 mL via INTRAVENOUS
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
## 2012-05-13 NOTE — ED Notes (Signed)
MD at bedside. 
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
## 2012-10-17 ENCOUNTER — Ambulatory Visit (INDEPENDENT_AMBULATORY_CARE_PROVIDER_SITE_OTHER): Payer: Self-pay | Admitting: Family Medicine

## 2012-10-17 VITALS — BP 110/70 | HR 73 | Resp 16 | Wt 203.1 lb

## 2012-10-17 DIAGNOSIS — E119 Type 2 diabetes mellitus without complications: Secondary | ICD-10-CM

## 2012-10-17 DIAGNOSIS — E785 Hyperlipidemia, unspecified: Secondary | ICD-10-CM

## 2012-10-17 LAB — HEMOGLOBIN A1C: Hgb A1c MFr Bld: 15.9 % — ABNORMAL HIGH (ref <5.7)

## 2012-10-17 LAB — BASIC METABOLIC PANEL
BUN: 11 mg/dL (ref 6–23)
CO2: 28 mEq/L (ref 19–32)
Chloride: 99 mEq/L (ref 96–112)
Creat: 0.98 mg/dL (ref 0.50–1.35)
Glucose, Bld: 272 mg/dL — ABNORMAL HIGH (ref 70–99)
Potassium: 4.3 mEq/L (ref 3.5–5.3)

## 2012-10-17 NOTE — Patient Instructions (Addendum)
F/u in 3 month.  You are an uncontrolled diabetic, fasting sugar was 295 today, it should be less than100  yOU WILL GET AN ACCUCHECK AVIVA METER    PLease get HBA1C AND CHEM 7  TODAY  YOU NEED MEDICATION AND I CAN ONLY START THIS AFTERI SEE YOUR LAB  PLEASE STOP SUGARY DRINKS,STOP CANDY, CAKE, ICE CREAM, COOKIES   EAT A LOT OF VEGETABLES, COLORED, PORTION CONTROL ON STARTCHY FOOD, AND MEAT  PLEASE GO TO DIABETIC ED CLASS, YOU WILL GET A SHEET WITH SCHEDULE, NO NEED TO CALL  It is important that you exercise regularly at least 30 minutes 7 times a week. If you develop chest pain, have severe difficulty breathing, or feel very tired, stop exercising immediately and seek medical attention   fOR SHOULDER PAIN TYLENOL 1 TABLET A T BEDTIME

## 2012-10-17 NOTE — Progress Notes (Signed)
  Subjective:    Patient ID: Travis Delgado, male    DOB: [birth year], 42 y.o.   MRN: [id number]  HPI Pt in for evaluation of weight loss and fatigue. He has been diagnosed with diabetes several years ago and is non compliant with medication, and is uninsured and unfortunately also unemployed. He c/o dry mouth and visual disturbance also C/o bilateral shoulder pain, which is chronic   Review of Systems See HPI Denies recent fever or chills. Denies sinus pressure, nasal congestion, ear pain or sore throat. Denies chest congestion, productive cough or wheezing. Denies chest pains, palpitations and leg swelling Denies abdominal pain, nausea, vomiting,diarrhea or constipation.   Dies have urinary frequency Denies headaches, seizures, numbness, or tingling. Denies depression, anxiety or insomnia. Denies skin break down or rash.        Objective:   Physical Exam  Patient alert and oriented and in no cardiopulmonary distress.Chronically ill appeariing wioth significant weight loss  HEENT: No facial asymmetry, EOMI, no sinus tenderness,  oropharynx pink and moist.  Neck supple no adenopathy.  Chest: Clear to auscultation bilaterally.  CVS: S1, S2 no murmurs, no S3.  ABD: Soft non tender. Bowel sounds normal.  Ext: No edema  MS: Adequate ROM spine, shoulders, hips and knees.         Assessment & Plan:

## 2012-10-19 ENCOUNTER — Encounter: Payer: Self-pay | Admitting: Family Medicine

## 2012-10-19 MED ORDER — METFORMIN HCL 1000 MG PO TABS: 1000.0000 mg | ORAL_TABLET | Freq: Two times a day (BID) | ORAL | Status: AC

## 2012-10-19 MED ORDER — GLIPIZIDE ER 10 MG PO TB24: ORAL_TABLET | ORAL | Status: DC

## 2012-10-19 NOTE — Assessment & Plan Note (Signed)
Uncontrolled, excessive weight loss, max dose glipizide and metformin as well as needs long acting insulin , will send for samples

## 2012-10-21 ENCOUNTER — Other Ambulatory Visit: Payer: Self-pay | Admitting: Family Medicine

## 2012-10-23 ENCOUNTER — Other Ambulatory Visit: Payer: Self-pay

## 2012-10-23 MED ORDER — DAPAGLIFLOZIN PROPANEDIOL 5 MG PO TABS: 1.0000 | ORAL_TABLET | Freq: Every day | ORAL | Status: DC

## 2012-10-24 ENCOUNTER — Telehealth: Payer: Self-pay | Admitting: Family Medicine

## 2012-10-24 NOTE — Telephone Encounter (Signed)
Wife aware that patient should be on 3 different diabetic meds.

## 2012-11-25 ENCOUNTER — Other Ambulatory Visit: Payer: Self-pay | Admitting: Family Medicine

## 2013-01-16 ENCOUNTER — Ambulatory Visit (INDEPENDENT_AMBULATORY_CARE_PROVIDER_SITE_OTHER): Payer: Self-pay | Admitting: Family Medicine

## 2013-01-16 ENCOUNTER — Encounter: Payer: Self-pay | Admitting: Family Medicine

## 2013-01-16 VITALS — BP 108/70 | HR 67 | Resp 16 | Ht 68.0 in | Wt 217.0 lb

## 2013-01-16 DIAGNOSIS — E669 Obesity, unspecified: Secondary | ICD-10-CM

## 2013-01-16 DIAGNOSIS — E119 Type 2 diabetes mellitus without complications: Secondary | ICD-10-CM

## 2013-01-16 DIAGNOSIS — E785 Hyperlipidemia, unspecified: Secondary | ICD-10-CM

## 2013-01-16 LAB — BASIC METABOLIC PANEL
CO2: 24 mEq/L (ref 19–32)
Chloride: 107 mEq/L (ref 96–112)
Creat: 1.06 mg/dL (ref 0.50–1.35)
Glucose, Bld: 112 mg/dL — ABNORMAL HIGH (ref 70–99)

## 2013-01-16 LAB — HEMOGLOBIN A1C: Mean Plasma Glucose: 166 mg/dL — ABNORMAL HIGH (ref <117)

## 2013-01-16 NOTE — Patient Instructions (Addendum)
F/u in 4 month, call if you need me before  Blood sugar this morning is excellent  HBA1C and chem 7 today  Commit to walking for health for 30 minutes every day please

## 2013-01-17 MED ORDER — DAPAGLIFLOZIN PROPANEDIOL 10 MG PO TABS: 1.0000 | ORAL_TABLET | Freq: Every day | ORAL | Status: DC

## 2013-01-19 NOTE — Assessment & Plan Note (Signed)
Hyperlipidemia:Low fat diet discussed and encouraged.  Will wait on getting labs as pt has no insurance at this time

## 2013-01-19 NOTE — Progress Notes (Signed)
  Subjective:    Patient ID: Travis Delgado, male    DOB: 1971/03/14, 42 y.o.   MRN: [id number]  HPI The PT is here for follow up and re-evaluation of chronic medical conditions, medication management and review of any available recent lab and radiology data.  Feels much better on medication, for blood sugar and fasting sugar when checked is seldom over 120 Reports improved vision. Still trying to get insured       Review of Systems See HPI Denies recent fever or chills. Denies sinus pressure, nasal congestion, ear pain or sore throat. Denies chest congestion, productive cough or wheezing. Denies chest pains, palpitations and leg swelling Denies abdominal pain, nausea, vomiting,diarrhea or constipation.   Denies dysuria, frequency, hesitancy or incontinence. Denies joint pain, swelling and limitation in mobility. Denies headaches, seizures, numbness, or tingling. Denies depression, anxiety or insomnia. Denies skin break down or rash.        Objective:   Physical Exam Patient alert and oriented and in no cardiopulmonary distress.Looks healthier  HEENT: No facial asymmetry, EOMI, no sinus tenderness,  oropharynx pink and moist.  Neck supple no adenopathy.  Chest: Clear to auscultation bilaterally.  CVS: S1, S2 no murmurs, no S3.  ABD: Soft non tender. Bowel sounds normal.  Ext: No edema  MS: Adequate ROM spine, shoulders, hips and knees.  Skin: Intact, no ulcerations or rash noted.  Psych: Good eye contact, normal affect. Memory intact not anxious or depressed appearing.  CNS: CN 2-12 intact, power, tone and sensation normal throughout.        Assessment & Plan:

## 2013-01-19 NOTE — Assessment & Plan Note (Signed)
Weight gain due to improved blood sugars.  Counselled re healthy diet and daily exercise to improve health and reduce weight

## 2013-01-19 NOTE — Assessment & Plan Note (Signed)
Marked improvement, still not at goal based on ae, inc dose of farxiga Patient advised to reduce carb and sweets, commit to regular physical activity, take meds as prescribed, test blood as directed, and attempt to lose weight, to improve blood sugar control.

## 2013-01-20 NOTE — Addendum Note (Signed)
Addended by: Kandis Fantasia B on: 01/20/2013 03:38 PM   Modules accepted: Orders

## 2013-05-23 ENCOUNTER — Ambulatory Visit: Payer: Self-pay | Admitting: Family Medicine

## 2013-10-26 ENCOUNTER — Other Ambulatory Visit: Payer: Self-pay | Admitting: Family Medicine

## 2014-01-22 ENCOUNTER — Other Ambulatory Visit: Payer: Self-pay | Admitting: Family Medicine

## 2014-01-22 ENCOUNTER — Telehealth: Payer: Self-pay | Admitting: Family Medicine

## 2014-01-22 DIAGNOSIS — E785 Hyperlipidemia, unspecified: Secondary | ICD-10-CM

## 2014-01-22 DIAGNOSIS — E119 Type 2 diabetes mellitus without complications: Secondary | ICD-10-CM

## 2014-01-22 NOTE — Telephone Encounter (Signed)
Patient scheduled appt for Monday and will have labs done tomorrow

## 2014-01-23 LAB — HEMOGLOBIN A1C
HEMOGLOBIN A1C: 13 % — AB (ref <5.7)
MEAN PLASMA GLUCOSE: 326 mg/dL — AB (ref <117)

## 2014-01-23 LAB — LIPID PANEL
Cholesterol: 197 mg/dL (ref 0–200)
HDL: 27 mg/dL — ABNORMAL LOW (ref >39)
LDL CALC: 109 mg/dL — AB (ref 0–99)
TRIGLYCERIDES: 306 mg/dL — AB (ref <150)
Total CHOL/HDL Ratio: 7.3 Ratio
VLDL: 61 mg/dL — AB (ref 0–40)

## 2014-01-26 ENCOUNTER — Ambulatory Visit (INDEPENDENT_AMBULATORY_CARE_PROVIDER_SITE_OTHER): Payer: Self-pay | Admitting: Family Medicine

## 2014-01-26 ENCOUNTER — Encounter: Payer: Self-pay | Admitting: Family Medicine

## 2014-01-26 VITALS — BP 110/70 | HR 61 | Resp 16 | Ht 68.0 in | Wt 217.1 lb

## 2014-01-26 DIAGNOSIS — F3289 Other specified depressive episodes: Secondary | ICD-10-CM

## 2014-01-26 DIAGNOSIS — E1165 Type 2 diabetes mellitus with hyperglycemia: Secondary | ICD-10-CM

## 2014-01-26 DIAGNOSIS — F32A Depression, unspecified: Secondary | ICD-10-CM

## 2014-01-26 DIAGNOSIS — IMO0001 Reserved for inherently not codable concepts without codable children: Secondary | ICD-10-CM

## 2014-01-26 DIAGNOSIS — F329 Major depressive disorder, single episode, unspecified: Secondary | ICD-10-CM

## 2014-01-26 DIAGNOSIS — IMO0002 Reserved for concepts with insufficient information to code with codable children: Secondary | ICD-10-CM

## 2014-01-26 DIAGNOSIS — E785 Hyperlipidemia, unspecified: Secondary | ICD-10-CM

## 2014-01-26 LAB — COMPLETE METABOLIC PANEL WITH GFR
ALT: 17 U/L (ref 0–53)
AST: 16 U/L (ref 0–37)
Albumin: 4.3 g/dL (ref 3.5–5.2)
Alkaline Phosphatase: 43 U/L (ref 39–117)
BUN: 12 mg/dL (ref 6–23)
CALCIUM: 9.1 mg/dL (ref 8.4–10.5)
CHLORIDE: 104 meq/L (ref 96–112)
CO2: 20 meq/L (ref 19–32)
CREATININE: 0.94 mg/dL (ref 0.50–1.35)
GFR, Est African American: >89 mL/min
Glucose, Bld: 237 mg/dL — ABNORMAL HIGH (ref 70–99)
Potassium: 4.3 mEq/L (ref 3.5–5.3)
Sodium: 137 mEq/L (ref 135–145)
Total Bilirubin: 0.4 mg/dL (ref 0.2–1.2)
Total Protein: 7.2 g/dL (ref 6.0–8.3)

## 2014-01-26 LAB — GLUCOSE, POCT (MANUAL RESULT ENTRY): POC GLUCOSE: 272 mg/dL — AB (ref 70–99)

## 2014-01-26 MED ORDER — FLUOXETINE HCL (PMDD) 10 MG PO TABS: 1.0000 | ORAL_TABLET | Freq: Every day | ORAL | Status: DC

## 2014-01-26 MED ORDER — DAPAGLIFLOZIN PROPANEDIOL 10 MG PO TABS: 1.0000 | ORAL_TABLET | Freq: Every day | ORAL | Status: DC

## 2014-01-26 MED ORDER — GLIPIZIDE ER 10 MG PO TB24: ORAL_TABLET | ORAL | Status: DC

## 2014-01-26 NOTE — Patient Instructions (Signed)
F/u  In 3 month, call if you need me before  New med for depression is prozac daily, and continue to try to help family and yourself  Resume glipizide 2 daily, and farxiga one daily, you will get coupon for free   Reduce cheese red meat , egg yolk , fried food, also potatoes, rice and pasta Call if  Unable to fill, then we will work with metformin  HBA1C and chem7  Non fast in 3 month

## 2014-01-26 NOTE — Progress Notes (Signed)
   Subjective:    Patient ID: Travis Delgado, male    DOB: Nov 07, 1970, 42 y.o.   MRN: [id number]  HPI Pt in aftewr a long absence with symptoms of classic uncontrolled blood sugars, with reports of blood sugar often over 400. His Mom recently had an amputation due to uncontrolled diabetes, he has been stressed, and intends to take better care of himself   Review of Systems See HPI Denies recent fever or chills.c/o fatigue, excessive thirst and urination also buirred vision in past 3 to 5 monhts Denies sinus pressure, nasal congestion, ear pain or sore throat. Denies chest congestion, productive cough or wheezing. Denies chest pains, palpitations and leg swelling Denies abdominal pain, nausea, vomiting,diarrhea or constipation.   Denies dysuria, frequency, hesitancy or incontinence. Denies joint pain, swelling and limitation in mobility. Denies headaches, seizures, numbness. Denies skin break down or rash.        Objective:   Physical Exam  BP 110/70 mmHg  Pulse 61  Resp 16  Ht 5\' 8"  (1.727 m)  Wt 217 lb 1.9 oz (98.485 kg)  BMI 33.02 kg/m2  SpO2 96%  Patient alert and oriented and in no cardiopulmonary distress.  HEENT: No facial asymmetry, EOMI,   oropharynx pink and moist.  Neck supple no JVD, no mass.  Chest: Clear to auscultation bilaterally.  CVS: S1, S2 no murmurs, no S3.Regular rate.  ABD: Soft non tender.   Ext: No edema  MS: Adequate ROM spine, shoulders, hips and knees.  Skin: Intact, no ulcerations or rash noted.  Psych: Good eye contact, normal affect. Memory intact not anxious or depressed appearing.  CNS: CN 2-12 intact, power,  normal throughout.no focal deficits noted.       Assessment & Plan:  Diabetes type 2, uncontrolled Uncontrolled and deteriorated due to medical non compliance 10 minutes spent in pt ed Labs today and medication to be taken, pt to follow diet and test blood sugars Lack of ins is a barrier  Hyperlipidemia LDL goal  <100 Uncontrolled Hyperlipidemia:Low fat diet discussed and encouraged.  Pt to start medication  Obesity, Class II, BMI 35-39.9, with comorbidity Unchanged Patient re-educated about  the importance of commitment to a  minimum of 150 minutes of exercise per week. The importance of healthy food choices with portion control discussed. Encouraged to start a food diary, count calories and to consider  joining a support group. Sample diet sheets offered. Goals set by the patient for the next several months.

## 2014-01-27 ENCOUNTER — Other Ambulatory Visit: Payer: Self-pay

## 2014-01-27 MED ORDER — LOVASTATIN 20 MG PO TABS: 20.0000 mg | ORAL_TABLET | Freq: Every day | ORAL | Status: DC

## 2014-04-22 ENCOUNTER — Encounter: Payer: Self-pay | Admitting: Family Medicine

## 2014-04-22 ENCOUNTER — Ambulatory Visit (INDEPENDENT_AMBULATORY_CARE_PROVIDER_SITE_OTHER): Payer: Self-pay | Admitting: Family Medicine

## 2014-04-22 VITALS — BP 114/74 | HR 71 | Resp 16 | Ht 68.0 in | Wt 219.0 lb

## 2014-04-22 DIAGNOSIS — E1165 Type 2 diabetes mellitus with hyperglycemia: Secondary | ICD-10-CM

## 2014-04-22 DIAGNOSIS — E785 Hyperlipidemia, unspecified: Secondary | ICD-10-CM

## 2014-04-22 DIAGNOSIS — E669 Obesity, unspecified: Secondary | ICD-10-CM

## 2014-04-22 DIAGNOSIS — IMO0002 Reserved for concepts with insufficient information to code with codable children: Secondary | ICD-10-CM

## 2014-04-22 LAB — COMPLETE METABOLIC PANEL WITH GFR
ALK PHOS: 38 U/L — AB (ref 39–117)
ALT: 25 U/L (ref 0–53)
AST: 25 U/L (ref 0–37)
Albumin: 4.4 g/dL (ref 3.5–5.2)
BUN: 12 mg/dL (ref 6–23)
CO2: 22 mEq/L (ref 19–32)
Calcium: 9.4 mg/dL (ref 8.4–10.5)
Chloride: 106 mEq/L (ref 96–112)
Creat: 0.93 mg/dL (ref 0.50–1.35)
GFR, Est African American: >89 mL/min
GFR, Est Non African American: >89 mL/min
Glucose, Bld: 147 mg/dL — ABNORMAL HIGH (ref 70–99)
Potassium: 4.2 mEq/L (ref 3.5–5.3)
SODIUM: 139 meq/L (ref 135–145)
TOTAL PROTEIN: 7.4 g/dL (ref 6.0–8.3)
Total Bilirubin: 0.5 mg/dL (ref 0.2–1.2)

## 2014-04-22 LAB — HEMOGLOBIN A1C
Hgb A1c MFr Bld: 10.7 % — ABNORMAL HIGH (ref <5.7)
Mean Plasma Glucose: 260 mg/dL — ABNORMAL HIGH (ref <117)

## 2014-04-22 LAB — GLUCOSE, POCT (MANUAL RESULT ENTRY): POC Glucose: 168 mg/dl — AB (ref 70–99)

## 2014-04-22 NOTE — Assessment & Plan Note (Signed)
Hyperlipidemia:Low fat diet discussed and encouraged.  Will re check lipids in 3 months

## 2014-04-22 NOTE — Assessment & Plan Note (Signed)
unchnaged Patient re-educated about  the importance of commitment to a  minimum of 150 minutes of exercise per week. The importance of healthy food choices with portion control discussed. Encouraged to start a food diary, count calories and to consider  joining a support group. Sample diet sheets offered. Goals set by the patient for the next several months.    

## 2014-04-22 NOTE — Assessment & Plan Note (Signed)
Patient advised to reduce carb and sweets, commit to regular physical activity, take meds as prescribed, test blood as directed, and attempt to lose weight, to improve blood sugar control. Updated lab today and may need to add metformin .

## 2014-04-22 NOTE — Patient Instructions (Addendum)
F/u in 4 months, call if you need me before  HBA1C , chem 7 and EGFR today  Call tomorrow afternoon if you do not get a call in the morning about you blood test result  Glad sugar seems better, goal is 7.0 or less  If HBa1C is 7.5 or more expect to add metformin  Pls get flu vaccine where able

## 2014-04-22 NOTE — Progress Notes (Signed)
   Subjective:    Patient ID: Travis Delgado, male    DOB: Jul 09, 1971, 43 y.o.   MRN: [id number]  HPI Pt in for re evaluation of uncontrolled blood sugar He states often his fasting sugar is about 150 to 160. Denies hypoglycemic episodes, polyuria or polydipsia. Has been more careful with diet reportedly, except that in past 2 weeks has been drink ing and eating more sweet than he head been Never filled prozac, denies depression , and states his Mom is doing better, which helps   Review of Systems See HPI Denies recent fever or chills. Denies sinus pressure, nasal congestion, ear pain or sore throat. Denies chest congestion, productive cough or wheezing. Denies chest pains, palpitations and leg swelling Denies abdominal pain, nausea, vomiting,diarrhea or constipation.   Denies dysuria, frequency, hesitancy or incontinence. Denies joint pain, swelling and limitation in mobility. Denies headaches, seizures, numbness, or tingling. . Denies skin break down or rash.        Objective:   Physical Exam  BP 114/74  Pulse 71  Resp 16  Ht 5\' 8"  (1.727 m)  Wt 219 lb (99.338 kg)  BMI 33.31 kg/m2  SpO2 99% Patient alert and oriented and in no cardiopulmonary distress.  HEENT: No facial asymmetry, EOMI,   oropharynx pink and moist.  Neck supple no JVD, no mass.  Chest: Clear to auscultation bilaterally.  CVS: S1, S2 no murmurs, no S3.Regular rate.  ABD: Soft non tender.   Ext: No edema  MS: Adequate ROM spine, shoulders, hips and knees.  Skin: Intact, no ulcerations or rash noted.  Psych: Good eye contact, normal affect. Memory intact not anxious or depressed appearing.  CNS: CN 2-12 intact, power,  normal throughout.no focal deficits noted.       Assessment & Plan:  Diabetes type 2, uncontrolled Patient advised to reduce carb and sweets, commit to regular physical activity, take meds as prescribed, test blood as directed, and attempt to lose weight, to improve  blood sugar control. Updated lab today and may need to add metformin .   Hyperlipidemia LDL goal <100 Hyperlipidemia:Low fat diet discussed and encouraged.  Will re check lipids in 3 months  Obesity unchnaged Patient re-educated about  the importance of commitment to a  minimum of 150 minutes of exercise per week. The importance of healthy food choices with portion control discussed. Encouraged to start a food diary, count calories and to consider  joining a support group. Sample diet sheets offered. Goals set by the patient for the next several months.

## 2014-04-27 ENCOUNTER — Other Ambulatory Visit: Payer: Self-pay

## 2014-04-27 MED ORDER — METFORMIN HCL 1000 MG PO TABS: 1000.0000 mg | ORAL_TABLET | Freq: Three times a day (TID) | ORAL | Status: DC

## 2014-04-27 NOTE — Addendum Note (Signed)
Addended by: Eual Fines on: 04/27/2014 02:49 PM   Modules accepted: Orders

## 2014-05-12 NOTE — Assessment & Plan Note (Signed)
Unchanged. Patient re-educated about  the importance of commitment to a  minimum of 150 minutes of exercise per week. The importance of healthy food choices with portion control discussed. Encouraged to start a food diary, count calories and to consider  joining a support group. Sample diet sheets offered. Goals set by the patient for the next several months.    

## 2014-05-12 NOTE — Assessment & Plan Note (Signed)
Uncontrolled and deteriorated due to medical non compliance 10 minutes spent in pt ed Labs today and medication to be taken, pt to follow diet and test blood sugars Lack of ins is a barrier

## 2014-05-12 NOTE — Assessment & Plan Note (Signed)
Uncontrolled Hyperlipidemia:Low fat diet discussed and encouraged.  Pt to start medication

## 2014-08-27 ENCOUNTER — Encounter: Payer: Self-pay | Admitting: Family Medicine

## 2014-08-27 ENCOUNTER — Ambulatory Visit (INDEPENDENT_AMBULATORY_CARE_PROVIDER_SITE_OTHER): Payer: Self-pay | Admitting: Family Medicine

## 2014-08-27 VITALS — BP 112/72 | HR 80 | Resp 16 | Ht 68.0 in | Wt 229.0 lb

## 2014-08-27 DIAGNOSIS — E1165 Type 2 diabetes mellitus with hyperglycemia: Secondary | ICD-10-CM

## 2014-08-27 DIAGNOSIS — E785 Hyperlipidemia, unspecified: Secondary | ICD-10-CM

## 2014-08-27 DIAGNOSIS — IMO0002 Reserved for concepts with insufficient information to code with codable children: Secondary | ICD-10-CM

## 2014-08-27 LAB — GLUCOSE, POCT (MANUAL RESULT ENTRY): POC GLUCOSE: 213 mg/dL — AB (ref 70–99)

## 2014-08-27 NOTE — Progress Notes (Signed)
   Subjective:    Patient ID: Travis Delgado, male    DOB: 05-30-1971, 44 y.o.   MRN: [id number]  HPI The PT is here for follow up and re-evaluation of chronic medical conditions, medication management and review of any available recent lab and radiology data.  Preventive health is updated, specifically  Cancer screening and Immunization.     There are no new concerns. Reports good blood sugars Denies polyuria, polydipsia, blurred vision , or hypoglycemic episodes.  Still stressed , no job, no insurance for health and non compliant with medication and testing most of the time      Review of Systems See HPI Denies recent fever or chills. Denies sinus pressure, nasal congestion, ear pain or sore throat. Denies chest congestion, productive cough or wheezing. Denies chest pains, palpitations and leg swelling Denies abdominal pain, nausea, vomiting,diarrhea or constipation.   Denies dysuria, frequency, hesitancy or incontinence. . Denies headaches, seizures, numbness, or tingling. Denies depression, anxiety or insomnia. Denies skin break down or rash.        Objective:   Physical Exam  BP 112/72 mmHg  Pulse 80  Resp 16  Ht 5\' 8"  (1.727 m)  Wt 229 lb (103.874 kg)  BMI 34.83 kg/m2  SpO2 97% Patient alert and oriented and in no cardiopulmonary distress.  HEENT: No facial asymmetry, EOMI,   oropharynx pink and moist.  Neck supple no JVD, no mass.  Chest: Clear to auscultation bilaterally.  CVS: S1, S2 no murmurs, no S3.Regular rate.  ABD: Soft non tender.   Ext: No edema  MS: Adequate ROM spine, shoulders, hips and knees.  Skin: Intact, no ulcerations or rash noted.  Psych: Good eye contact, normal affect. Memory intact not anxious or depressed appearing.  CNS: CN 2-12 intact, power,  normal throughout.no focal deficits noted.       Assessment & Plan:  Diabetes type 2, uncontrolled Deteriorated and uncontrolled, lack of insurance are barriers to  appropriate care. Will strongly encourage pt to seek heath care through the health dsept  Until this changes as he is at high risk for adverse outcomes form markedly uncontrolled blood sugar Info to apply for help through the [facility] system is also to be provided  Morbid obesity Deteriorated. Patient re-educated about  the importance of commitment to a  minimum of 150 minutes of exercise per week.  The importance of healthy food choices with portion control discussed. Encouraged to start a food diary, count calories and to consider  joining a support group. Sample diet sheets offered. Goals set by the patient for the next several months.   Weight /BMI 08/27/2014 04/22/2014 01/26/2014  WEIGHT 229 lb 219 lb 217 lb 1.9 oz  HEIGHT 5\' 8"  5\' 8"  5\' 8"   BMI 34.83 kg/m2 33.31 kg/m2 33.02 kg/m2    Current exercise per week 120 minutes.   Hyperlipidemia LDL goal <100 Uncontrolled Hyperlipidemia:Low fat diet discussed and encouraged.   Lipid Panel  Lab Results  Component Value Date   CHOL 269* 08/27/2014   HDL 43 08/27/2014   LDLCALC 205* 08/27/2014   TRIG 106 08/27/2014   CHOLHDL 6.3 08/27/2014

## 2014-08-27 NOTE — Patient Instructions (Addendum)
F/u in 3.5  month, call if you need me beforw  TSH, CBC, hBA1C, fasting lipid, cmp and EGFR, today   Pls increazse green vegetable, reduce potato, rice and bread , and past and gatorade so blood sugar improves and you get more healthy

## 2014-08-28 LAB — CBC WITH DIFFERENTIAL/PLATELET
Basophils Absolute: 0 10*3/uL (ref 0.0–0.1)
Basophils Relative: 0 % (ref 0–1)
EOS ABS: 0.1 10*3/uL (ref 0.0–0.7)
EOS PCT: 1 % (ref 0–5)
HCT: 44.4 % (ref 39.0–52.0)
Hemoglobin: 14.6 g/dL (ref 13.0–17.0)
LYMPHS ABS: 2.5 10*3/uL (ref 0.7–4.0)
Lymphocytes Relative: 45 % (ref 12–46)
MCH: 26.6 pg (ref 26.0–34.0)
MCHC: 32.9 g/dL (ref 30.0–36.0)
MCV: 81 fL (ref 78.0–100.0)
MONO ABS: 0.5 10*3/uL (ref 0.1–1.0)
MONOS PCT: 9 % (ref 3–12)
MPV: 10.7 fL (ref 8.6–12.4)
Neutro Abs: 2.5 10*3/uL (ref 1.7–7.7)
Neutrophils Relative %: 45 % (ref 43–77)
Platelets: 241 10*3/uL (ref 150–400)
RBC: 5.48 MIL/uL (ref 4.22–5.81)
RDW: 15.1 % (ref 11.5–15.5)
WBC: 5.6 10*3/uL (ref 4.0–10.5)

## 2014-08-29 LAB — COMPLETE METABOLIC PANEL WITH GFR
ALK PHOS: 41 U/L (ref 39–117)
ALT: 25 U/L (ref 0–53)
AST: 21 U/L (ref 0–37)
Albumin: 4.7 g/dL (ref 3.5–5.2)
BUN: 16 mg/dL (ref 6–23)
CO2: 24 mEq/L (ref 19–32)
Calcium: 9.9 mg/dL (ref 8.4–10.5)
Chloride: 102 mEq/L (ref 96–112)
Creat: 1.04 mg/dL (ref 0.50–1.35)
GFR, EST NON AFRICAN AMERICAN: 88 mL/min
GFR, Est African American: >89 mL/min
Glucose, Bld: 174 mg/dL — ABNORMAL HIGH (ref 70–99)
POTASSIUM: 4.1 meq/L (ref 3.5–5.3)
SODIUM: 136 meq/L (ref 135–145)
TOTAL PROTEIN: 7.9 g/dL (ref 6.0–8.3)
Total Bilirubin: 0.8 mg/dL (ref 0.2–1.2)

## 2014-08-29 LAB — LIPID PANEL
CHOLESTEROL: 269 mg/dL — AB (ref 0–200)
HDL: 43 mg/dL (ref >39)
LDL CALC: 205 mg/dL — AB (ref 0–99)
Total CHOL/HDL Ratio: 6.3 Ratio
Triglycerides: 106 mg/dL (ref <150)
VLDL: 21 mg/dL (ref 0–40)

## 2014-08-29 LAB — HEMOGLOBIN A1C
Hgb A1c MFr Bld: 11.9 % — ABNORMAL HIGH (ref <5.7)
Mean Plasma Glucose: 295 mg/dL — ABNORMAL HIGH (ref <117)

## 2014-08-29 LAB — TSH: TSH: 2.785 u[IU]/mL (ref 0.350–4.500)

## 2014-08-31 ENCOUNTER — Other Ambulatory Visit: Payer: Self-pay | Admitting: Family Medicine

## 2014-08-31 MED ORDER — METFORMIN HCL 1000 MG PO TABS: 1000.0000 mg | ORAL_TABLET | Freq: Two times a day (BID) | ORAL | Status: DC

## 2014-08-31 MED ORDER — LOVASTATIN 20 MG PO TABS: 20.0000 mg | ORAL_TABLET | Freq: Every day | ORAL | Status: DC

## 2014-09-14 ENCOUNTER — Other Ambulatory Visit: Payer: Self-pay | Admitting: Family Medicine

## 2014-09-14 ENCOUNTER — Telehealth (INDEPENDENT_AMBULATORY_CARE_PROVIDER_SITE_OTHER): Payer: Self-pay

## 2014-09-14 NOTE — Telephone Encounter (Signed)
Left message that coupon was available and they could come collect

## 2014-10-16 ENCOUNTER — Telehealth: Payer: Self-pay | Admitting: Family Medicine

## 2014-10-16 NOTE — Telephone Encounter (Signed)
Pls call pt's wife , I was just in touch with hospital support system for uninsured patients. His DM is severe as you are aware, really needs endo. I spoke recently with wife [name] explaining this esp the fact that needs access top regular lab work etc. She is to have discussed with him and started the process of getting him to health dept, as she calls hospital patient accounting and starts the process of applying for and hopefully getting the 100% or some %discount so he can get labs etc  In the interim,thru a current program , he is able to get free nutrition counseling (diabetic ed) with Jearld Fenton, but when we refer need to state ion the form pt uninsured and request her services f through current grant for such persons. Since this is new, pls try to follow thru entirely on this. If it goes through, this is a current option up to this time. NOTE this does NOT involve Dr Dorris Fetch at this time only for diabetic ed I gather there may be even more help ahead, but waiting to hear from the source when completed  Please verify with him that he is interested before doing this , also best to spk with wife [name] initially as I have not spoken directly to [facility]  ?? pls ask

## 2014-10-19 NOTE — Telephone Encounter (Signed)
Called patient and left message for them to return call at the office   

## 2014-10-19 NOTE — Telephone Encounter (Signed)
Will talk with patient and have him come by here to sign the referral form so I can fax it over. Requires a client sig. She will call me back and let me know

## 2014-11-23 ENCOUNTER — Ambulatory Visit: Payer: Self-pay | Admitting: Family Medicine

## 2014-12-21 NOTE — Assessment & Plan Note (Signed)
Deteriorated and uncontrolled, lack of insurance are barriers to appropriate care. Will strongly encourage pt to seek heath care through the health dsept  Until this changes as he is at high risk for adverse outcomes form markedly uncontrolled blood sugar Info to apply for help through the [facility] system is also to be provided

## 2014-12-21 NOTE — Assessment & Plan Note (Signed)
Deteriorated. Patient re-educated about  the importance of commitment to a  minimum of 150 minutes of exercise per week.  The importance of healthy food choices with portion control discussed. Encouraged to start a food diary, count calories and to consider  joining a support group. Sample diet sheets offered. Goals set by the patient for the next several months.   Weight /BMI 08/27/2014 04/22/2014 01/26/2014  WEIGHT 229 lb 219 lb 217 lb 1.9 oz  HEIGHT 5\' 8"  5\' 8"  5\' 8"   BMI 34.83 kg/m2 33.31 kg/m2 33.02 kg/m2    Current exercise per week 120 minutes.

## 2014-12-21 NOTE — Assessment & Plan Note (Signed)
Uncontrolled Hyperlipidemia:Low fat diet discussed and encouraged.   Lipid Panel  Lab Results  Component Value Date   CHOL 269* 08/27/2014   HDL 43 08/27/2014   LDLCALC 205* 08/27/2014   TRIG 106 08/27/2014   CHOLHDL 6.3 08/27/2014

## 2015-01-04 ENCOUNTER — Ambulatory Visit: Payer: Self-pay | Admitting: Family Medicine

## 2015-02-19 ENCOUNTER — Other Ambulatory Visit: Payer: Self-pay | Admitting: Family Medicine

## 2015-02-28 ENCOUNTER — Emergency Department (HOSPITAL_COMMUNITY)
Admission: EM | Admit: 2015-02-28 | Discharge: 2015-02-28 | Disposition: A | Discharge status: Home / Self Care | Payer: Self-pay | Attending: Emergency Medicine | Admitting: Emergency Medicine

## 2015-02-28 ENCOUNTER — Encounter (HOSPITAL_COMMUNITY): Payer: Self-pay | Admitting: Emergency Medicine

## 2015-02-28 DIAGNOSIS — Z87828 Personal history of other (healed) physical injury and trauma: Secondary | ICD-10-CM | POA: Insufficient documentation

## 2015-02-28 DIAGNOSIS — Z79899 Other long term (current) drug therapy: Secondary | ICD-10-CM | POA: Insufficient documentation

## 2015-02-28 DIAGNOSIS — Z87891 Personal history of nicotine dependence: Secondary | ICD-10-CM | POA: Insufficient documentation

## 2015-02-28 DIAGNOSIS — E669 Obesity, unspecified: Secondary | ICD-10-CM | POA: Insufficient documentation

## 2015-02-28 DIAGNOSIS — R0981 Nasal congestion: Secondary | ICD-10-CM | POA: Insufficient documentation

## 2015-02-28 DIAGNOSIS — E119 Type 2 diabetes mellitus without complications: Secondary | ICD-10-CM | POA: Insufficient documentation

## 2015-02-28 DIAGNOSIS — J029 Acute pharyngitis, unspecified: Secondary | ICD-10-CM | POA: Insufficient documentation

## 2015-02-28 MED ORDER — MAGIC MOUTHWASH W/LIDOCAINE: 5.0000 mL | Freq: Three times a day (TID) | ORAL | Status: DC | PRN

## 2015-02-28 MED ORDER — GUAIFENESIN-CODEINE 100-10 MG/5ML PO SYRP: 10.0000 mL | ORAL_SOLUTION | Freq: Three times a day (TID) | ORAL | Status: DC | PRN

## 2015-02-28 NOTE — ED Notes (Signed)
Pt states has had sore throat for past 4 days, along with congestion and drainage.

## 2015-02-28 NOTE — Discharge Instructions (Signed)
Pharyngitis Pharyngitis is a sore throat (pharynx). There is redness, pain, and swelling of your throat. HOME CARE   Drink enough fluids to keep your pee (urine) clear or pale yellow.  Only take medicine as told by your doctor.  You may get sick again if you do not take medicine as told. Finish your medicines, even if you start to feel better.  Do not take aspirin.  Rest.  Rinse your mouth (gargle) with salt water ( tsp of salt per 1 qt of water) every 1-2 hours. This will help the pain.  If you are not at risk for choking, you can suck on hard candy or sore throat lozenges. GET HELP IF:  You have large, tender lumps on your neck.  You have a rash.  You cough up green, yellow-brown, or bloody spit. GET HELP RIGHT AWAY IF:   You have a stiff neck.  You drool or cannot swallow liquids.  You throw up (vomit) or are not able to keep medicine or liquids down.  You have very bad pain that does not go away with medicine.  You have problems breathing (not from a stuffy nose). MAKE SURE YOU:   Understand these instructions.  Will watch your condition.  Will get help right away if you are not doing well or get worse. Document Released: 12/13/2007 Document Revised: 04/16/2013 Document Reviewed: 03/03/2013 [facility] Patient Information 2015 [place], [place]. This information is not intended to replace advice given to you by your health care provider. Make sure you discuss any questions you have with your health care provider.  Viral Infections A virus is a type of germ. Viruses can cause:  Minor sore throats.  Aches and pains.  Headaches.  Runny nose.  Rashes.  Watery eyes.  Tiredness.  Coughs.  Loss of appetite.  Feeling sick to your stomach (nausea).  Throwing up (vomiting).  Watery poop (diarrhea). HOME CARE   Only take medicines as told by your doctor.  Drink enough water and fluids to keep your pee (urine) clear or pale yellow. Sports drinks are a  good choice.  Get plenty of rest and eat healthy. Soups and broths with crackers or rice are fine. GET HELP RIGHT AWAY IF:   You have a very bad headache.  You have shortness of breath.  You have chest pain or neck pain.  You have an unusual rash.  You cannot stop throwing up.  You have watery poop that does not stop.  You cannot keep fluids down.  You or your child has a temperature by mouth above 102 F (38.9 C), not controlled by medicine.  Your baby is older than 3 months with a rectal temperature of 102 F (38.9 C) or higher.  Your baby is 35 months old or younger with a rectal temperature of 100.4 F (38 C) or higher. MAKE SURE YOU:   Understand these instructions.  Will watch this condition.  Will get help right away if you are not doing well or get worse. Document Released: 06/08/2008 Document Revised: 09/18/2011 Document Reviewed: 11/01/2010 [facility] Patient Information 2015 [place], [place]. This information is not intended to replace advice given to you by your health care provider. Make sure you discuss any questions you have with your health care provider.
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
## 2015-03-02 ENCOUNTER — Other Ambulatory Visit: Payer: Self-pay | Admitting: Family Medicine

## 2015-03-02 ENCOUNTER — Encounter (HOSPITAL_COMMUNITY): Payer: Self-pay | Admitting: *Deleted

## 2015-03-02 ENCOUNTER — Emergency Department (HOSPITAL_COMMUNITY)
Admission: EM | Admit: 2015-03-02 | Discharge: 2015-03-02 | Disposition: A | Discharge status: Home / Self Care | Payer: Self-pay | Attending: Emergency Medicine | Admitting: Emergency Medicine

## 2015-03-02 ENCOUNTER — Emergency Department (HOSPITAL_COMMUNITY): Payer: Self-pay

## 2015-03-02 DIAGNOSIS — Z79899 Other long term (current) drug therapy: Secondary | ICD-10-CM | POA: Insufficient documentation

## 2015-03-02 DIAGNOSIS — J029 Acute pharyngitis, unspecified: Secondary | ICD-10-CM | POA: Insufficient documentation

## 2015-03-02 DIAGNOSIS — Z87891 Personal history of nicotine dependence: Secondary | ICD-10-CM | POA: Insufficient documentation

## 2015-03-02 DIAGNOSIS — J40 Bronchitis, not specified as acute or chronic: Secondary | ICD-10-CM

## 2015-03-02 DIAGNOSIS — E669 Obesity, unspecified: Secondary | ICD-10-CM | POA: Insufficient documentation

## 2015-03-02 DIAGNOSIS — E119 Type 2 diabetes mellitus without complications: Secondary | ICD-10-CM | POA: Insufficient documentation

## 2015-03-02 DIAGNOSIS — J209 Acute bronchitis, unspecified: Secondary | ICD-10-CM | POA: Insufficient documentation

## 2015-03-02 DIAGNOSIS — Z87828 Personal history of other (healed) physical injury and trauma: Secondary | ICD-10-CM | POA: Insufficient documentation

## 2015-03-02 LAB — CBC WITH DIFFERENTIAL/PLATELET
BASOS ABS: 0 10*3/uL (ref 0.0–0.1)
BASOS PCT: 0 % (ref 0–1)
EOS ABS: 0.1 10*3/uL (ref 0.0–0.7)
EOS PCT: 2 % (ref 0–5)
HCT: 39.1 % (ref 39.0–52.0)
Hemoglobin: 12.8 g/dL — ABNORMAL LOW (ref 13.0–17.0)
Lymphocytes Relative: 31 % (ref 12–46)
Lymphs Abs: 2.1 10*3/uL (ref 0.7–4.0)
MCH: 26.8 pg (ref 26.0–34.0)
MCHC: 32.7 g/dL (ref 30.0–36.0)
MCV: 81.8 fL (ref 78.0–100.0)
MONO ABS: 0.7 10*3/uL (ref 0.1–1.0)
Monocytes Relative: 11 % (ref 3–12)
Neutro Abs: 3.9 10*3/uL (ref 1.7–7.7)
Neutrophils Relative %: 56 % (ref 43–77)
PLATELETS: 204 10*3/uL (ref 150–400)
RBC: 4.78 MIL/uL (ref 4.22–5.81)
RDW: 14.2 % (ref 11.5–15.5)
WBC: 6.9 10*3/uL (ref 4.0–10.5)

## 2015-03-02 LAB — BASIC METABOLIC PANEL
ANION GAP: 8 (ref 5–15)
BUN: 11 mg/dL (ref 6–20)
CALCIUM: 8.7 mg/dL — AB (ref 8.9–10.3)
CO2: 26 mmol/L (ref 22–32)
Chloride: 103 mmol/L (ref 101–111)
Creatinine, Ser: 0.87 mg/dL (ref 0.61–1.24)
GFR calc Af Amer: >60 mL/min (ref >60)
GLUCOSE: 175 mg/dL — AB (ref 65–99)
POTASSIUM: 4 mmol/L (ref 3.5–5.1)
SODIUM: 137 mmol/L (ref 135–145)

## 2015-03-02 LAB — RAPID STREP SCREEN (MED CTR MEBANE ONLY): STREPTOCOCCUS, GROUP A SCREEN (DIRECT): NEGATIVE

## 2015-03-02 IMAGING — DX DG CHEST 2V
2 series · 2 of 2 positions shown · non-contrast
Comparison: [DATE]

CLINICAL DATA: Sore throat with swelling/redness of the eyes for 2
days, history type II diabetes mellitus

EXAM:
CHEST  2 VIEW

[chest pa]
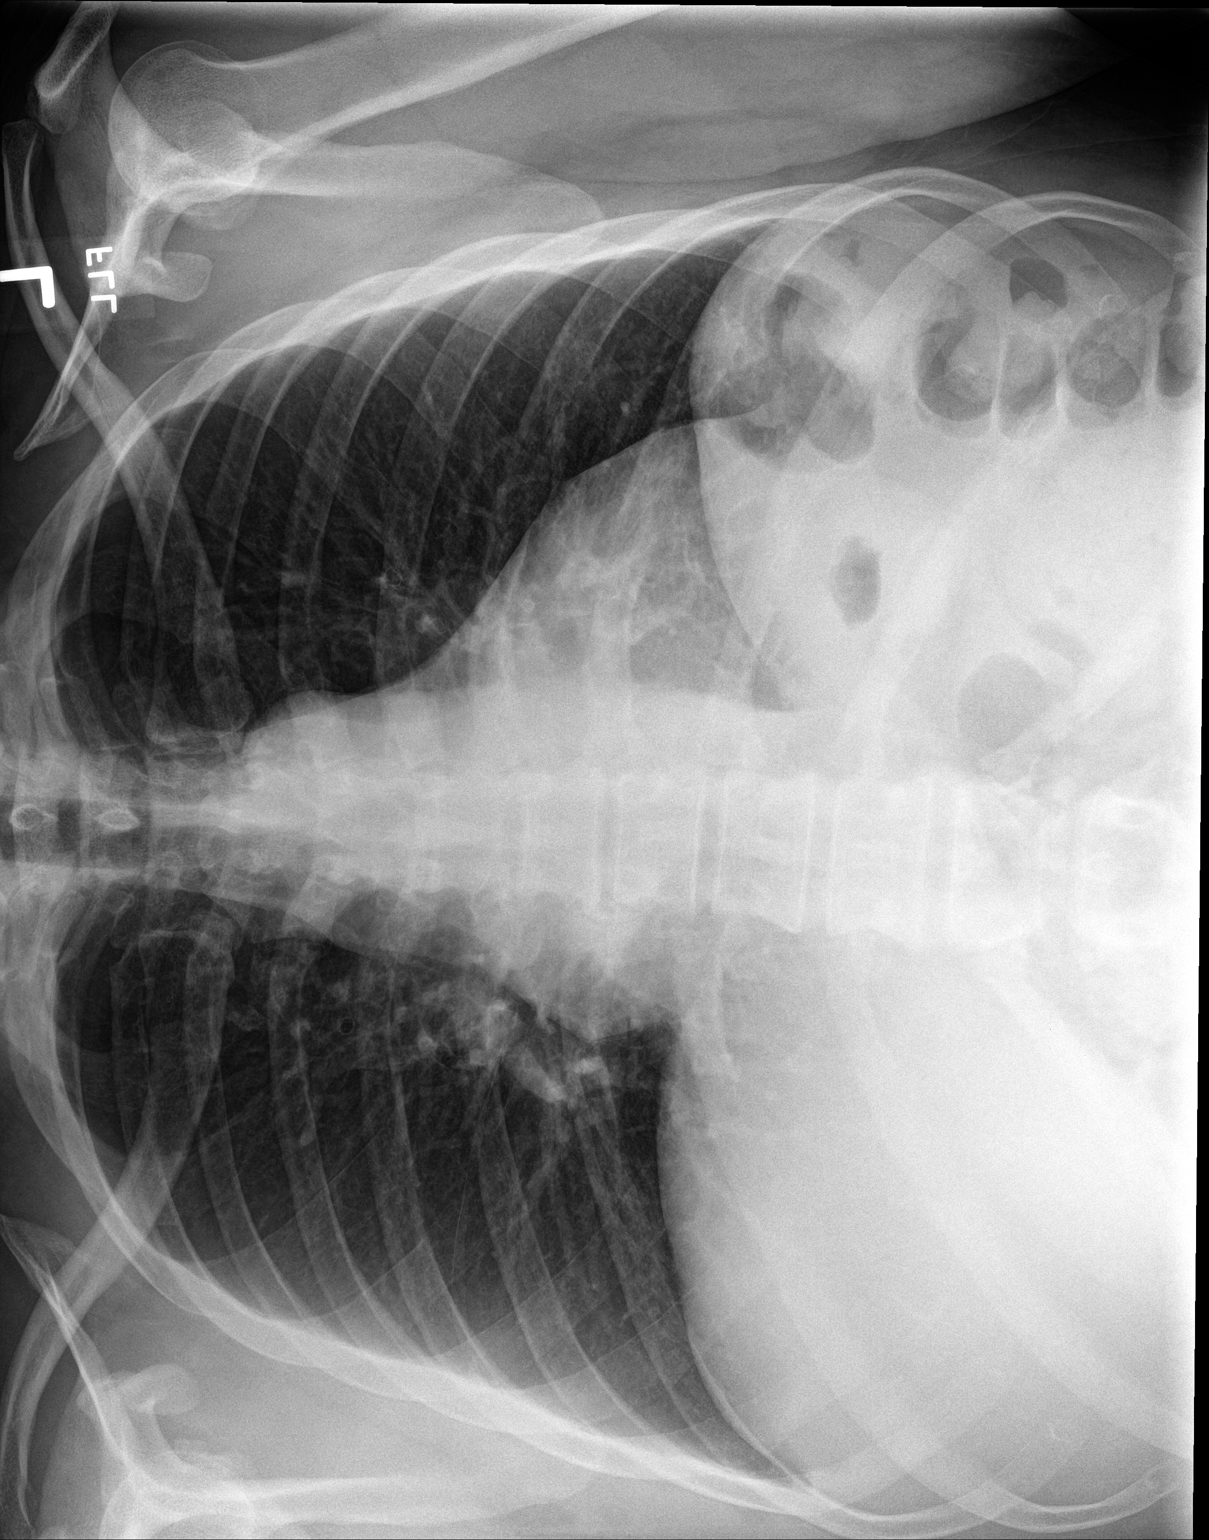

[chest lat]
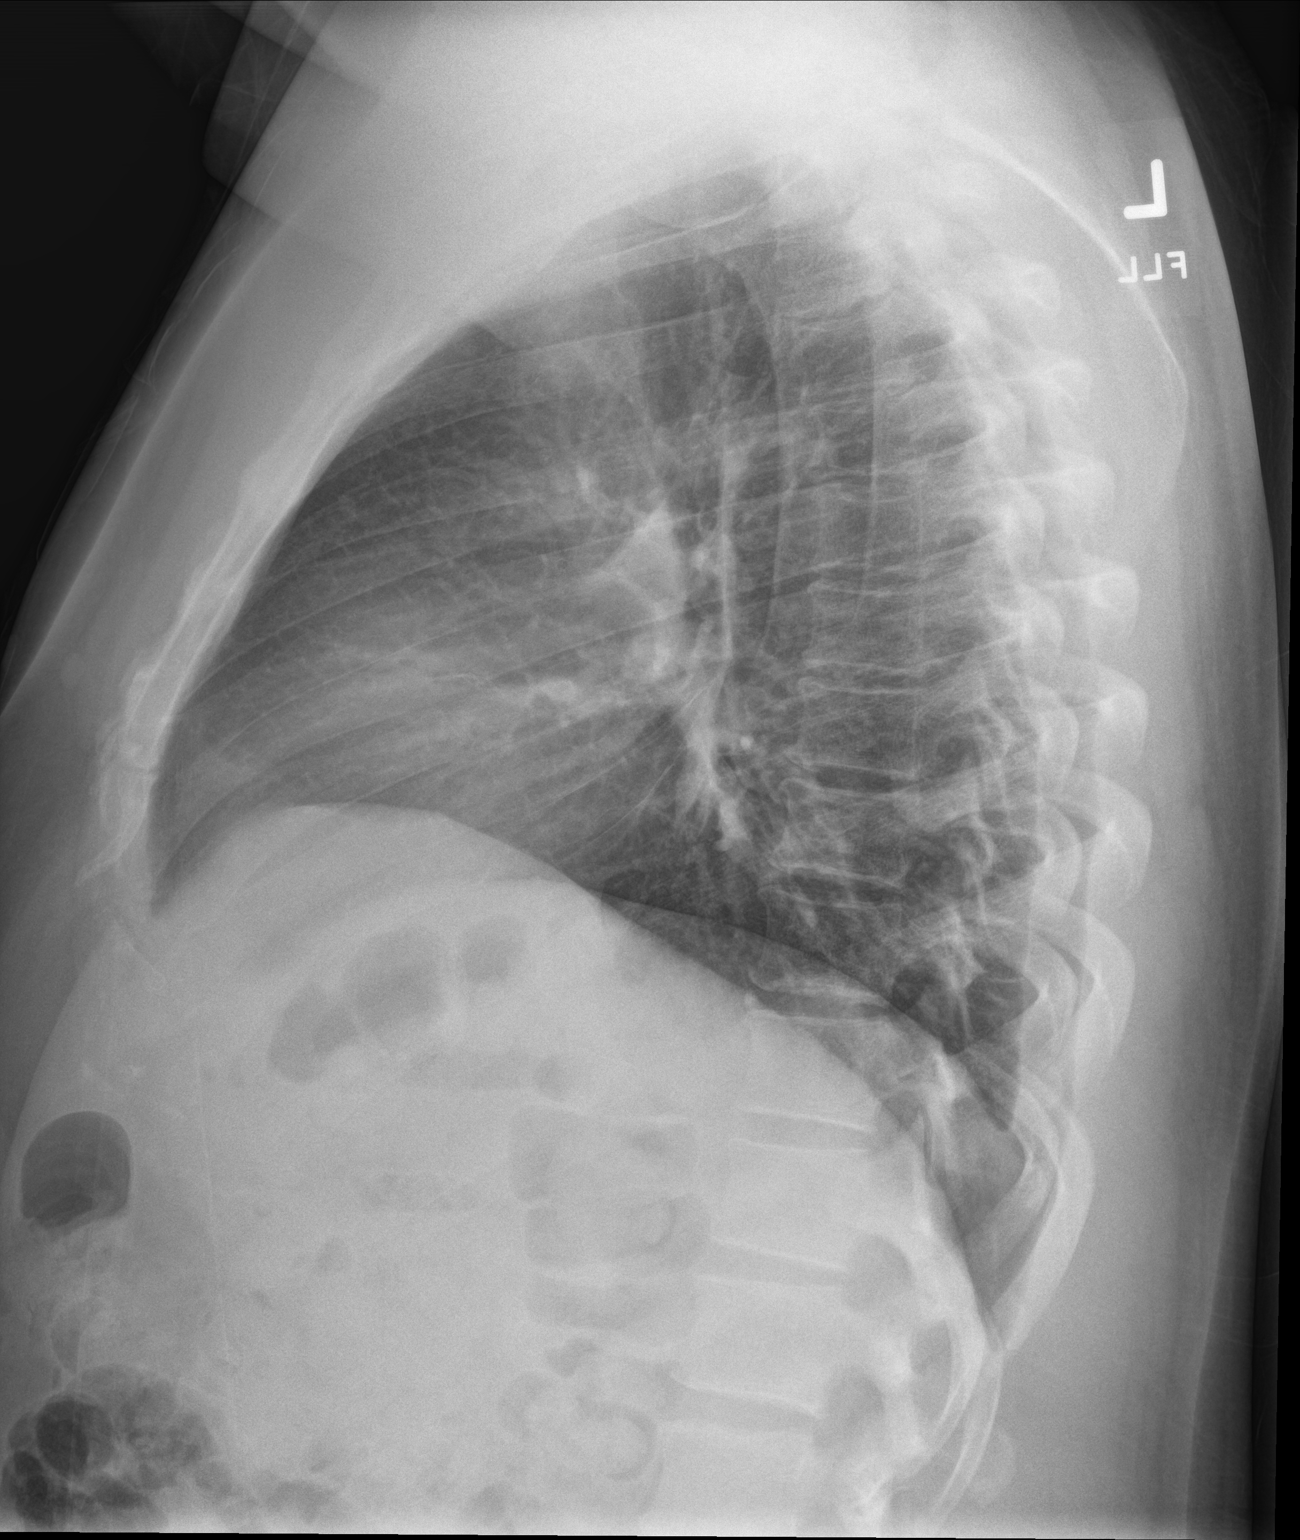

[2 of 2 positions shown; findings below may reference images not displayed]

FINDINGS: Borderline enlargement of cardiac silhouette.

Mediastinal contours and pulmonary vascularity normal.

Peribronchial thickening without infiltrate, pleural effusion or
pneumothorax.

Bones unremarkable.
IMPRESSION: Bronchitic changes without infiltrate.

## 2015-03-02 MED ORDER — IBUPROFEN 800 MG PO TABS: 800.0000 mg | ORAL_TABLET | Freq: Three times a day (TID) | ORAL | Status: DC

## 2015-03-02 MED ORDER — HYDROCODONE-ACETAMINOPHEN 5-325 MG PO TABS
2.0000 | ORAL_TABLET | Freq: Once | ORAL | Status: AC
  Administered 2015-03-02: 2 via ORAL
  Filled 2015-03-02: qty 2

## 2015-03-02 MED ORDER — ONDANSETRON 4 MG PO TBDP: 4.0000 mg | ORAL_TABLET | Freq: Three times a day (TID) | ORAL | Status: DC | PRN

## 2015-03-02 MED ORDER — SODIUM CHLORIDE 0.9 % IV SOLN
Freq: Once | INTRAVENOUS | Status: AC
  Administered 2015-03-02: 1000 mL/h via INTRAVENOUS

## 2015-03-02 MED ORDER — AMOXICILLIN 500 MG PO CAPS: 500.0000 mg | ORAL_CAPSULE | Freq: Three times a day (TID) | ORAL | Status: DC

## 2015-03-02 NOTE — ED Notes (Signed)
Pt comes in for sore throat and eye swelling. Pt was recently seen for sore throat on 8/21 and given mouthwash and cough medication. Pt states he is feeling no better. Pt has new symptom of left eye swelling and redness starting Sunday night. NAD noted. Airway patent at this time.
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
## 2015-03-02 NOTE — ED Notes (Signed)
Just received report from nurse

## 2015-03-02 NOTE — Discharge Instructions (Signed)
Pharyngitis Pharyngitis is redness, pain, and swelling (inflammation) of your pharynx.  CAUSES  Pharyngitis is usually caused by infection. Most of the time, these infections are from viruses (viral) and are part of a cold. However, sometimes pharyngitis is caused by bacteria (bacterial). Pharyngitis can also be caused by allergies. Viral pharyngitis may be spread from person to person by coughing, sneezing, and personal items or utensils (cups, forks, spoons, toothbrushes). Bacterial pharyngitis may be spread from person to person by more intimate contact, such as kissing.  SIGNS AND SYMPTOMS  Symptoms of pharyngitis include:   Sore throat.   Tiredness (fatigue).   Low-grade fever.   Headache.  Joint pain and muscle aches.  Skin rashes.  Swollen lymph nodes.  Plaque-like film on throat or tonsils (often seen with bacterial pharyngitis). DIAGNOSIS  Your health care provider will ask you questions about your illness and your symptoms. Your medical history, along with a physical exam, is often all that is needed to diagnose pharyngitis. Sometimes, a rapid strep test is done. Other lab tests may also be done, depending on the suspected cause.  TREATMENT  Viral pharyngitis will usually get better in 3-4 days without the use of medicine. Bacterial pharyngitis is treated with medicines that kill germs (antibiotics).  HOME CARE INSTRUCTIONS   Drink enough water and fluids to keep your urine clear or pale yellow.   Only take over-the-counter or prescription medicines as directed by your health care provider:   If you are prescribed antibiotics, make sure you finish them even if you start to feel better.   Do not take aspirin.   Get lots of rest.   Gargle with 8 oz of salt water ( tsp of salt per 1 qt of water) as often as every 1-2 hours to soothe your throat.   Throat lozenges (if you are not at risk for choking) or sprays may be used to soothe your throat. SEEK MEDICAL  CARE IF:   You have large, tender lumps in your neck.  You have a rash.  You cough up green, yellow-brown, or bloody spit. SEEK IMMEDIATE MEDICAL CARE IF:   Your neck becomes stiff.  You drool or are unable to swallow liquids.  You vomit or are unable to keep medicines or liquids down.  You have severe pain that does not go away with the use of recommended medicines.  You have trouble breathing (not caused by a stuffy nose). MAKE SURE YOU:   Understand these instructions.  Will watch your condition.  Will get help right away if you are not doing well or get worse. Document Released: 06/26/2005 Document Revised: 04/16/2013 Document Reviewed: 03/03/2013 [facility] Patient Information 2015 [place], [place]. This information is not intended to replace advice given to you by your health care provider. Make sure you discuss any questions you have with your health care provider. Acute Bronchitis Bronchitis is inflammation of the airways that extend from the windpipe into the lungs (bronchi). The inflammation often causes mucus to develop. This leads to a cough, which is the most common symptom of bronchitis.  In acute bronchitis, the condition usually develops suddenly and goes away over time, usually in a couple weeks. Smoking, allergies, and asthma can make bronchitis worse. Repeated episodes of bronchitis may cause further lung problems.  CAUSES Acute bronchitis is most often caused by the same virus that causes a cold. The virus can spread from person to person (contagious) through coughing, sneezing, and touching contaminated objects. SIGNS AND SYMPTOMS  Cough.   Fever.   Coughing up mucus.   Body aches.   Chest congestion.   Chills.   Shortness of breath.   Sore throat.  DIAGNOSIS  Acute bronchitis is usually diagnosed through a physical exam. Your health care provider will also ask you questions about your medical history. Tests, such as chest X-rays, are  sometimes done to rule out other conditions.  TREATMENT  Acute bronchitis usually goes away in a couple weeks. Oftentimes, no medical treatment is necessary. Medicines are sometimes given for relief of fever or cough. Antibiotic medicines are usually not needed but may be prescribed in certain situations. In some cases, an inhaler may be recommended to help reduce shortness of breath and control the cough. A cool mist vaporizer may also be used to help thin bronchial secretions and make it easier to clear the chest.  HOME CARE INSTRUCTIONS  Get plenty of rest.   Drink enough fluids to keep your urine clear or pale yellow (unless you have a medical condition that requires fluid restriction). Increasing fluids may help thin your respiratory secretions (sputum) and reduce chest congestion, and it will prevent dehydration.   Take medicines only as directed by your health care provider.  If you were prescribed an antibiotic medicine, finish it all even if you start to feel better.  Avoid smoking and secondhand smoke. Exposure to cigarette smoke or irritating chemicals will make bronchitis worse. If you are a smoker, consider using nicotine gum or skin patches to help control withdrawal symptoms. Quitting smoking will help your lungs heal faster.   Reduce the chances of another bout of acute bronchitis by washing your hands frequently, avoiding people with cold symptoms, and trying not to touch your hands to your mouth, nose, or eyes.   Keep all follow-up visits as directed by your health care provider.  SEEK MEDICAL CARE IF: Your symptoms do not improve after 1 week of treatment.  SEEK IMMEDIATE MEDICAL CARE IF:  You develop an increased fever or chills.   You have chest pain.   You have severe shortness of breath.  You have bloody sputum.   You develop dehydration.  You faint or repeatedly feel like you are going to pass out.  You develop repeated vomiting.  You develop a  severe headache. MAKE SURE YOU:   Understand these instructions.  Will watch your condition.  Will get help right away if you are not doing well or get worse. Document Released: 08/03/2004 Document Revised: 11/10/2013 Document Reviewed: 12/17/2012 [facility] Patient Information 2015 [place], [place]. This information is not intended to replace advice given to you by your health care provider. Make sure you discuss any questions you have with your health care provider.

## 2015-03-04 LAB — CULTURE, GROUP A STREP: STREP A CULTURE: NEGATIVE

## 2015-04-08 ENCOUNTER — Other Ambulatory Visit: Payer: Self-pay | Admitting: Family Medicine

## 2015-08-04 ENCOUNTER — Other Ambulatory Visit: Payer: Self-pay | Admitting: Family Medicine

## 2015-10-05 ENCOUNTER — Emergency Department (HOSPITAL_COMMUNITY)
Admission: EM | Admit: 2015-10-05 | Discharge: 2015-10-05 | Disposition: A | Discharge status: Home / Self Care | Payer: BLUE CROSS/BLUE SHIELD | Attending: Emergency Medicine | Admitting: Emergency Medicine

## 2015-10-05 ENCOUNTER — Encounter (HOSPITAL_COMMUNITY): Payer: Self-pay | Admitting: Emergency Medicine

## 2015-10-05 ENCOUNTER — Emergency Department (HOSPITAL_COMMUNITY): Payer: BLUE CROSS/BLUE SHIELD

## 2015-10-05 DIAGNOSIS — M722 Plantar fascial fibromatosis: Secondary | ICD-10-CM | POA: Diagnosis not present

## 2015-10-05 DIAGNOSIS — Z7984 Long term (current) use of oral hypoglycemic drugs: Secondary | ICD-10-CM | POA: Diagnosis not present

## 2015-10-05 DIAGNOSIS — E119 Type 2 diabetes mellitus without complications: Secondary | ICD-10-CM | POA: Diagnosis not present

## 2015-10-05 DIAGNOSIS — S93402A Sprain of unspecified ligament of left ankle, initial encounter: Secondary | ICD-10-CM | POA: Diagnosis not present

## 2015-10-05 DIAGNOSIS — Y999 Unspecified external cause status: Secondary | ICD-10-CM | POA: Diagnosis not present

## 2015-10-05 DIAGNOSIS — Z79899 Other long term (current) drug therapy: Secondary | ICD-10-CM | POA: Insufficient documentation

## 2015-10-05 DIAGNOSIS — Z792 Long term (current) use of antibiotics: Secondary | ICD-10-CM | POA: Insufficient documentation

## 2015-10-05 DIAGNOSIS — R079 Chest pain, unspecified: Secondary | ICD-10-CM

## 2015-10-05 DIAGNOSIS — Y9301 Activity, walking, marching and hiking: Secondary | ICD-10-CM | POA: Insufficient documentation

## 2015-10-05 DIAGNOSIS — Y9289 Other specified places as the place of occurrence of the external cause: Secondary | ICD-10-CM | POA: Insufficient documentation

## 2015-10-05 DIAGNOSIS — S99912A Unspecified injury of left ankle, initial encounter: Secondary | ICD-10-CM | POA: Diagnosis present

## 2015-10-05 DIAGNOSIS — Z791 Long term (current) use of non-steroidal anti-inflammatories (NSAID): Secondary | ICD-10-CM | POA: Diagnosis not present

## 2015-10-05 DIAGNOSIS — Z87891 Personal history of nicotine dependence: Secondary | ICD-10-CM | POA: Insufficient documentation

## 2015-10-05 DIAGNOSIS — X58XXXA Exposure to other specified factors, initial encounter: Secondary | ICD-10-CM | POA: Diagnosis not present

## 2015-10-05 DIAGNOSIS — E669 Obesity, unspecified: Secondary | ICD-10-CM | POA: Insufficient documentation

## 2015-10-05 LAB — BASIC METABOLIC PANEL
ANION GAP: 7 (ref 5–15)
BUN: 16 mg/dL (ref 6–20)
CALCIUM: 9.2 mg/dL (ref 8.9–10.3)
CHLORIDE: 106 mmol/L (ref 101–111)
CO2: 25 mmol/L (ref 22–32)
Creatinine, Ser: 0.95 mg/dL (ref 0.61–1.24)
GFR calc non Af Amer: >60 mL/min (ref >60)
Glucose, Bld: 168 mg/dL — ABNORMAL HIGH (ref 65–99)
Potassium: 4.3 mmol/L (ref 3.5–5.1)
Sodium: 138 mmol/L (ref 135–145)

## 2015-10-05 LAB — TROPONIN I: Troponin I: <0.03 ng/mL (ref <0.031)

## 2015-10-05 IMAGING — DX DG ANKLE COMPLETE 3+V*L*
3 series · 3 of 3 positions shown · non-contrast
Comparison: None.

CLINICAL DATA: Generalized pain.  No history of trauma

EXAM:
LEFT ANKLE COMPLETE - 3+ VIEW

[ankle ap]
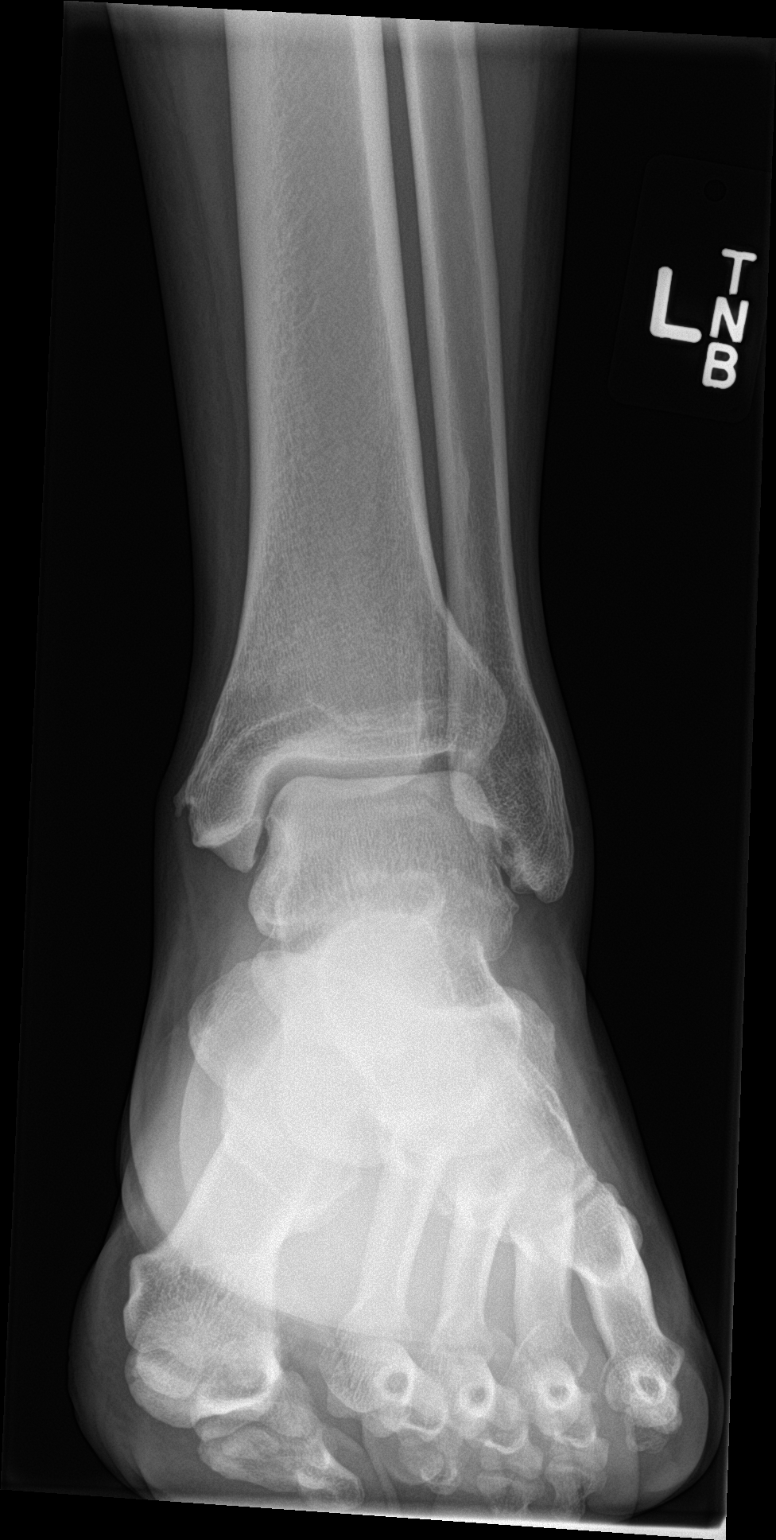

[ankle obl]
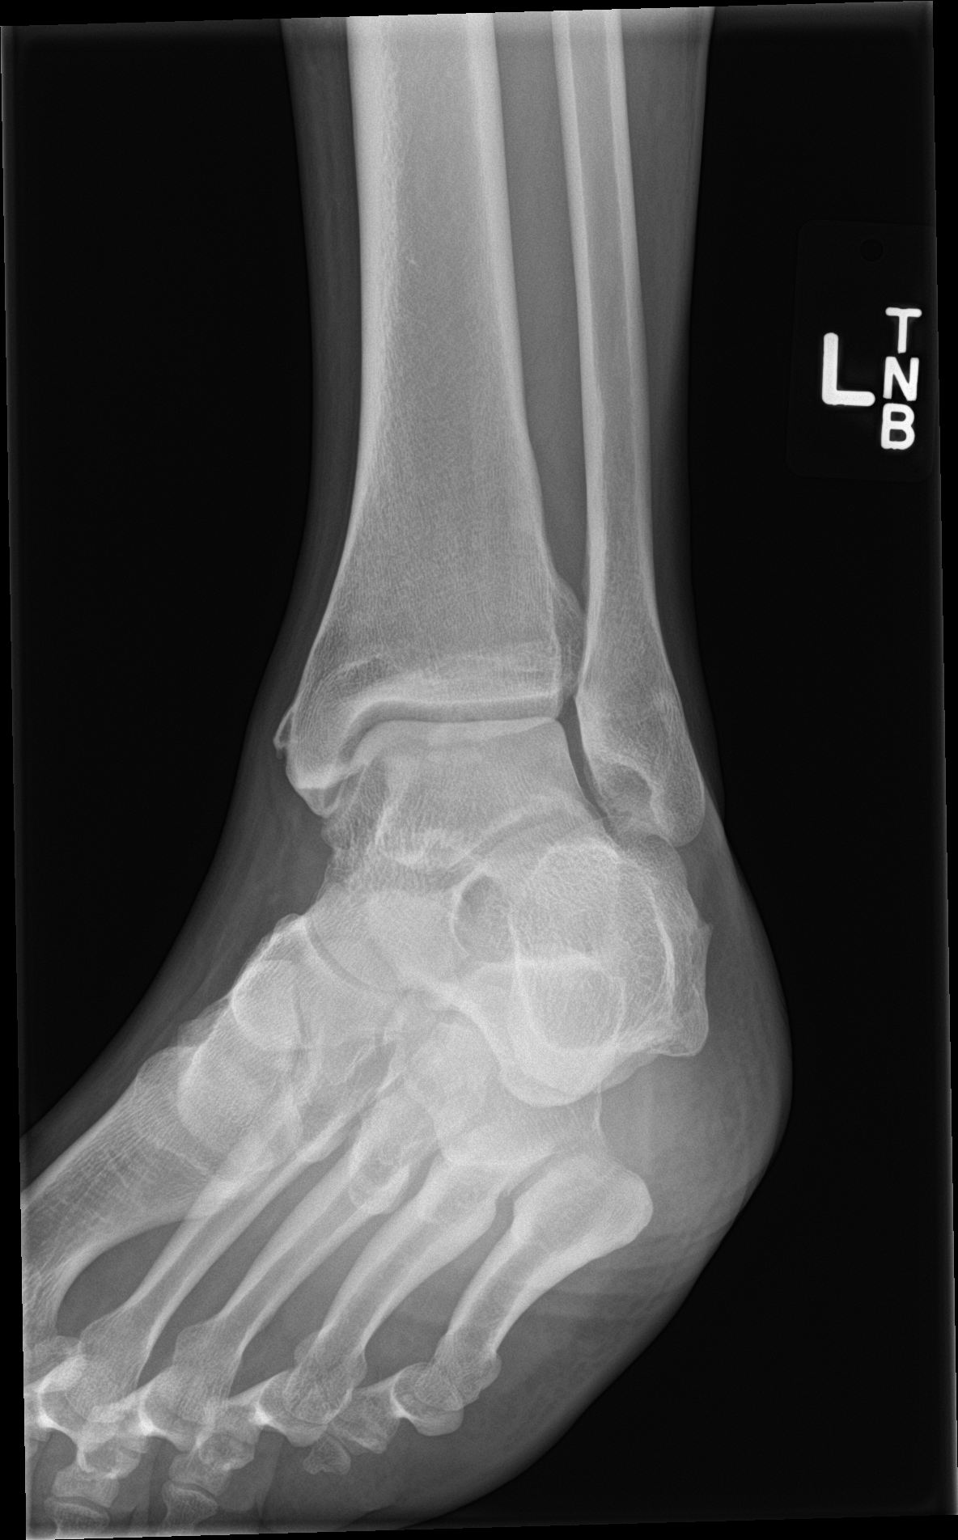

[ankle lat]
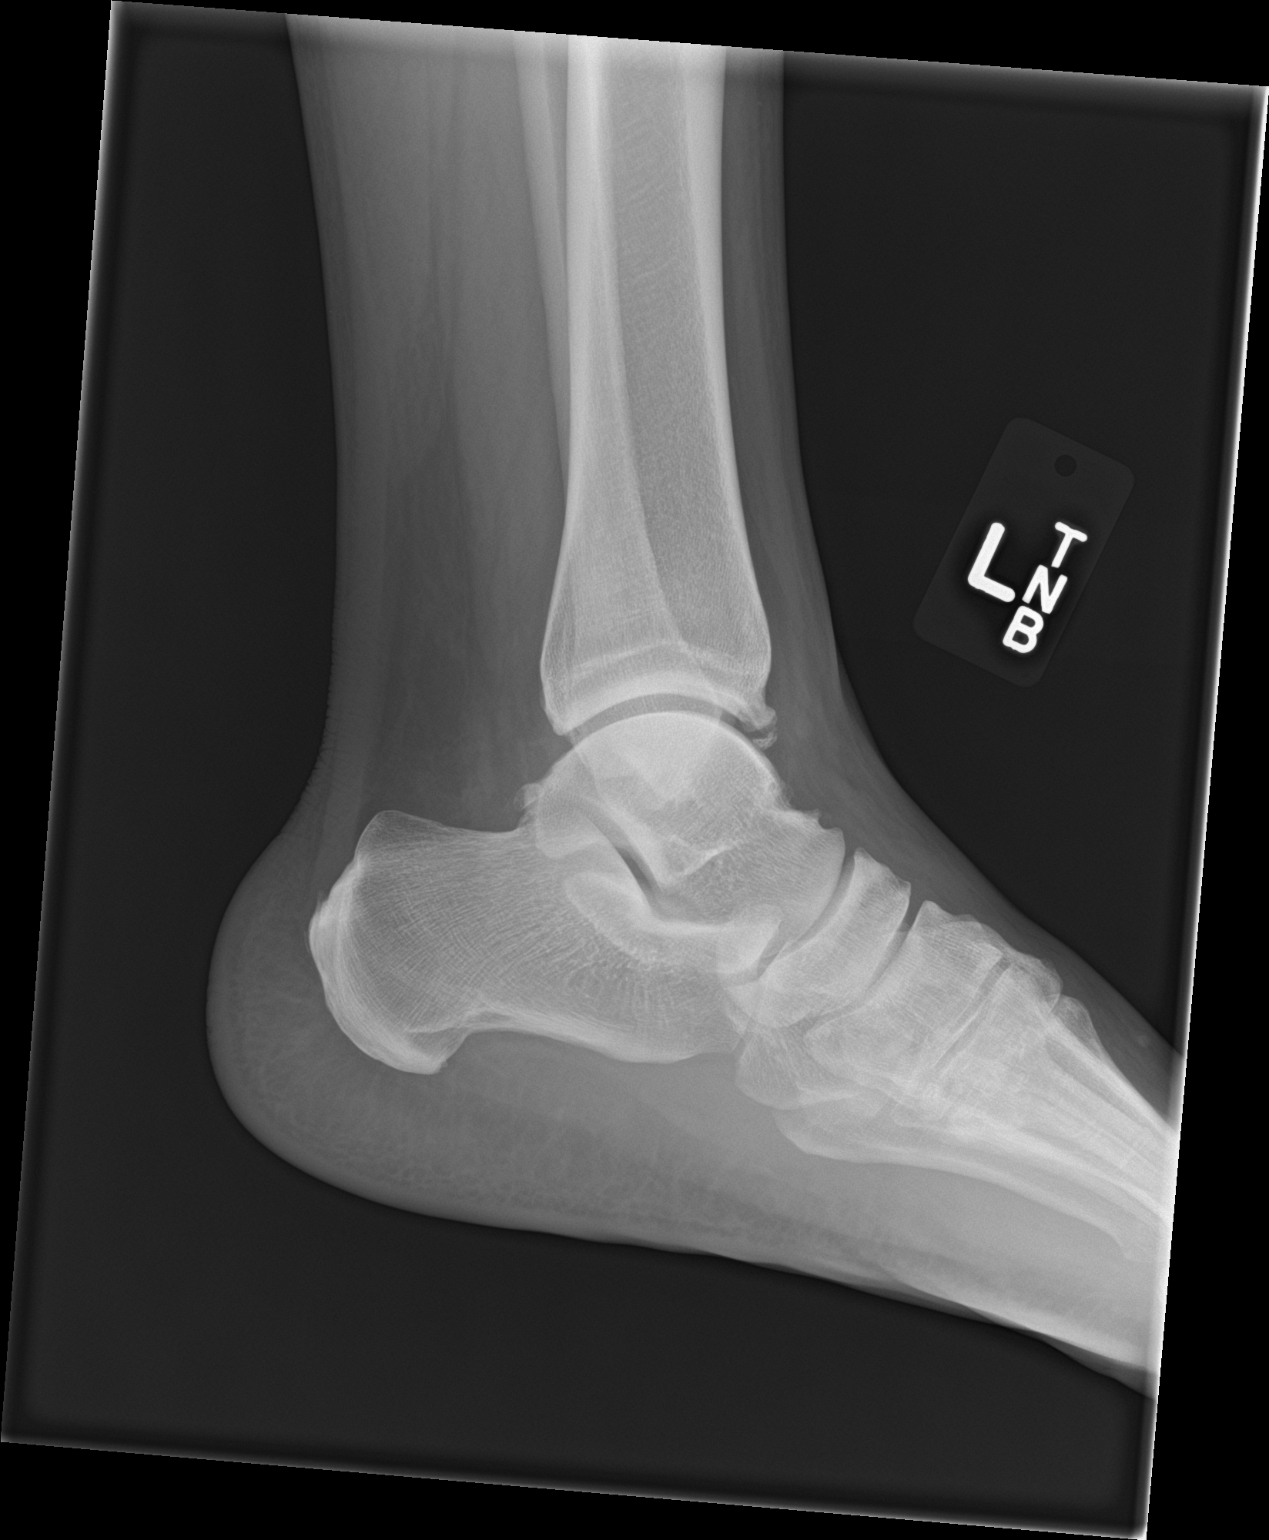

[3 of 3 positions shown; findings below may reference images not displayed]

FINDINGS: Frontal, oblique, and lateral views were obtained. There is no acute
fracture or joint effusion. The ankle mortise appears intact. There
is a small exostosis arising from the anterior medial malleolar
region. No erosive change.
IMPRESSION: Small exostosis along the anterior aspect of the medial malleolus.
No fracture. Mortise intact.

## 2015-10-05 IMAGING — DX DG CHEST 2V
2 series · 2 of 2 positions shown · non-contrast
Comparison: [DATE]

CLINICAL DATA: Chest pain, shortness of breath, left ankle pain

EXAM:
CHEST  2 VIEW

[chest pa]
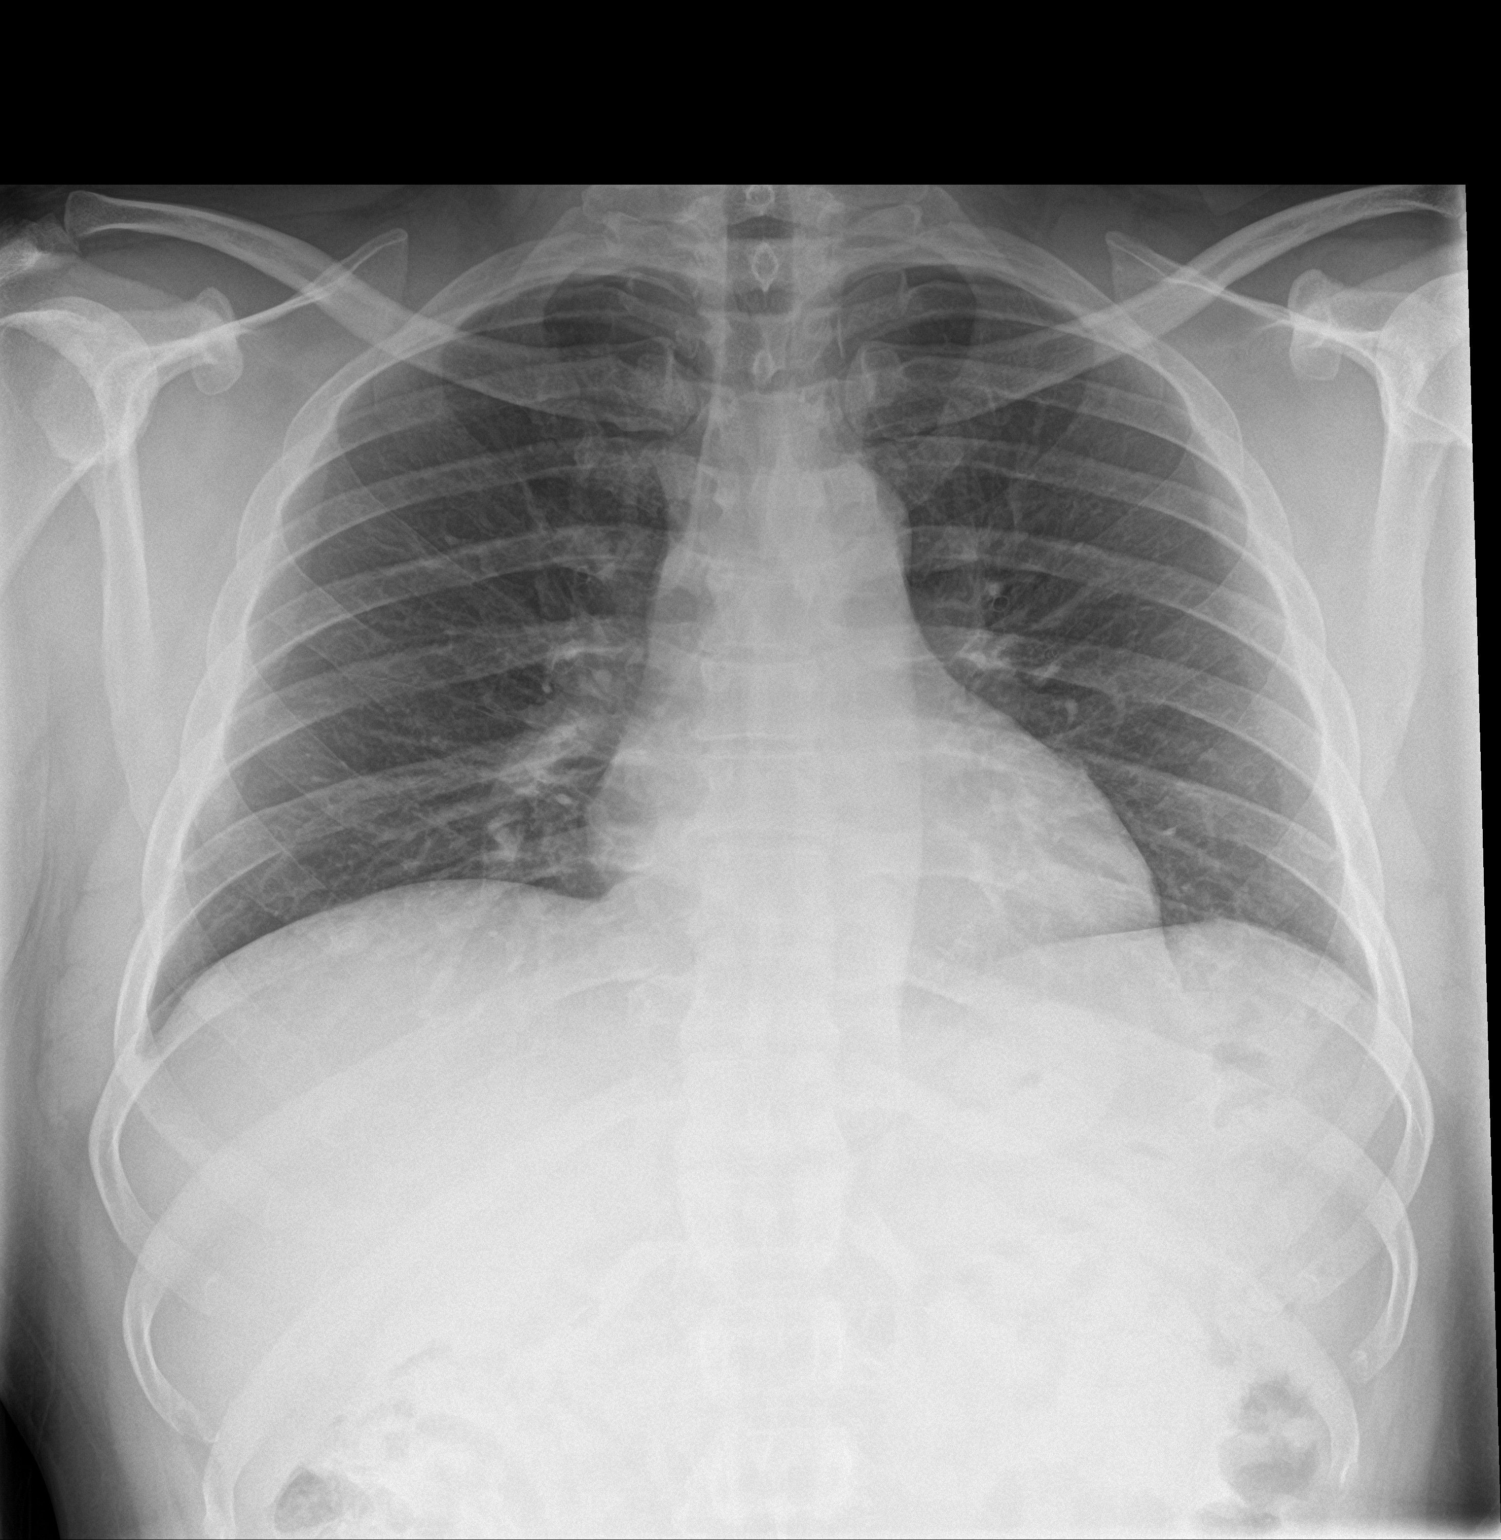

[chest lat]
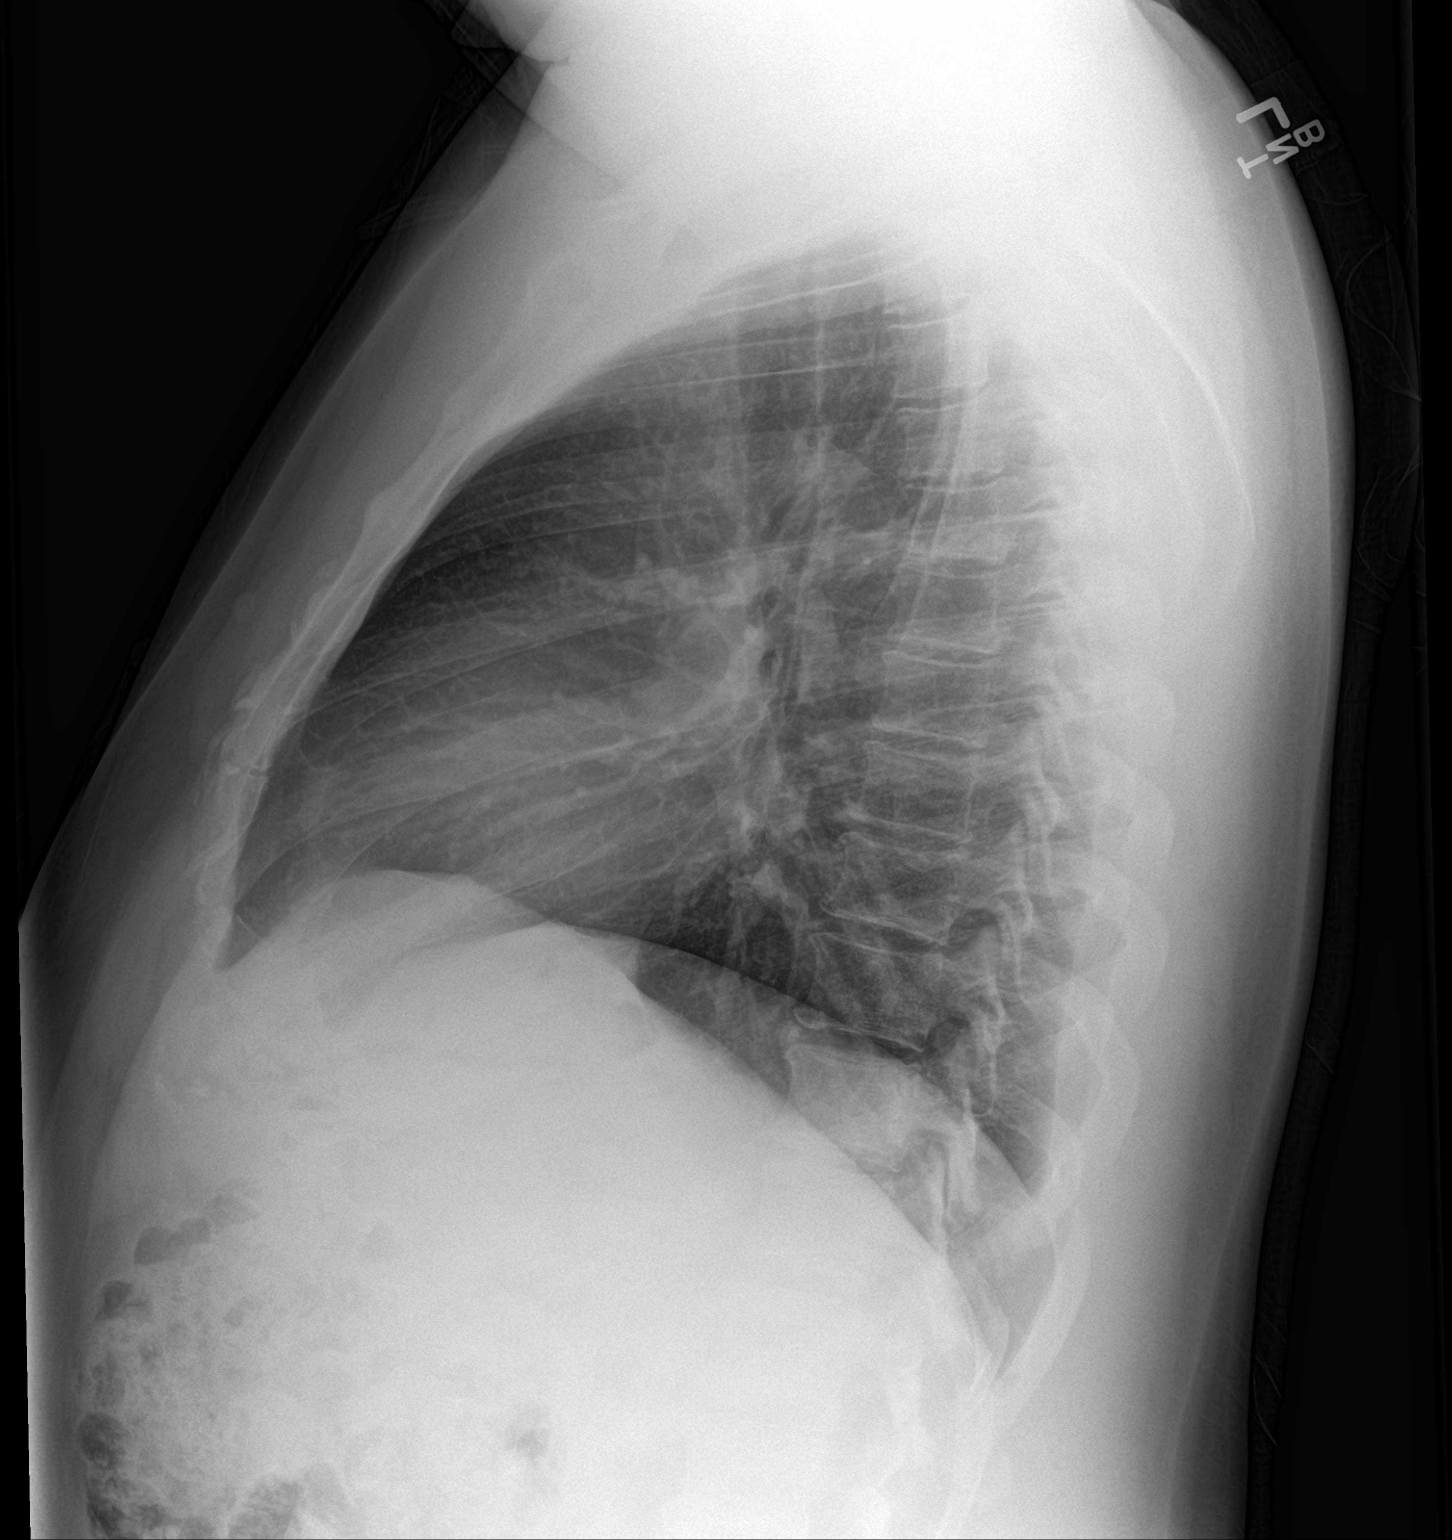

[2 of 2 positions shown; findings below may reference images not displayed]

FINDINGS: Cardiomediastinal silhouette is unremarkable. No acute infiltrate or
pleural effusion. No pulmonary edema. Bony thorax is unremarkable.
IMPRESSION: No active cardiopulmonary disease.

## 2015-10-05 MED ORDER — IBUPROFEN 400 MG PO TABS
400.0000 mg | ORAL_TABLET | Freq: Once | ORAL | Status: AC
  Administered 2015-10-05: 400 mg via ORAL
  Filled 2015-10-05: qty 1

## 2015-10-05 MED ORDER — HYDROCODONE-ACETAMINOPHEN 5-325 MG PO TABS
2.0000 | ORAL_TABLET | Freq: Once | ORAL | Status: AC
  Administered 2015-10-05: 2 via ORAL
  Filled 2015-10-05: qty 2

## 2015-10-05 NOTE — ED Notes (Signed)
Left foot pain, rates pain 9/10.  Denies any injury.

## 2015-10-05 NOTE — Discharge Instructions (Signed)
The x-ray of your ankle is negative for fracture, dislocation, foreign body. Your examination favors ankle sprain, and plantar fasciitis. Please use your ankle splint over the next 7-10 days. Please see Dr. Aline Brochure for additional evaluation if not improving. Please see your primary physician for additional evaluation concerning your chest pain symptoms. Please come to the emergency department immediately if the symptoms return before your office appointment. Your electrocardiogram and your cardiac size were all negative today. Ankle Sprain An ankle sprain is an injury to the strong, fibrous tissues (ligaments) that hold your ankle bones together.  HOME CARE   Put ice on your ankle for 1-2 days or as told by your doctor.  Put ice in a plastic bag.  Place a towel between your skin and the bag.  Leave the ice on for 15-20 minutes at a time, every 2 hours while you are awake.  Only take medicine as told by your doctor.  Raise (elevate) your injured ankle above the level of your heart as much as possible for 2-3 days.  Use crutches if your doctor tells you to. Slowly put your own weight on the affected ankle. Use the crutches until you can walk without pain.  If you have a plaster splint:  Do not rest it on anything harder than a pillow for 24 hours.  Do not put weight on it.  Do not get it wet.  Take it off to shower or bathe.  If given, use an elastic wrap or support stocking for support. Take the wrap off if your toes lose feeling (numb), tingle, or turn cold or blue.  If you have an air splint:  Add or let out air to make it comfortable.  Take it off at night and to shower and bathe.  Wiggle your toes and move your ankle up and down often while you are wearing it. GET HELP IF:  You have rapidly increasing bruising or puffiness (swelling).  Your toes feel very cold.  You lose feeling in your foot.  Your medicine does not help your pain. GET HELP RIGHT AWAY IF:   Your  toes lose feeling (numb) or turn blue.  You have severe pain that is increasing. MAKE SURE YOU:   Understand these instructions.  Will watch your condition.  Will get help right away if you are not doing well or get worse.   This information is not intended to replace advice given to you by your health care provider. Make sure you discuss any questions you have with your health care provider.   Document Released: 12/13/2007 Document Revised: 07/17/2014 Document Reviewed: 01/08/2012 [employer] Interactive Patient Education [employer].
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
## 2015-10-18 ENCOUNTER — Other Ambulatory Visit: Payer: Self-pay

## 2015-10-18 MED ORDER — LOVASTATIN 20 MG PO TABS: 20.0000 mg | ORAL_TABLET | Freq: Every day | ORAL | Status: DC

## 2016-02-06 ENCOUNTER — Other Ambulatory Visit: Payer: Self-pay | Admitting: Family Medicine

## 2016-02-17 ENCOUNTER — Other Ambulatory Visit: Payer: Self-pay | Admitting: Family Medicine

## 2016-02-23 ENCOUNTER — Encounter (HOSPITAL_COMMUNITY): Payer: Self-pay | Admitting: Emergency Medicine

## 2016-02-23 ENCOUNTER — Emergency Department (HOSPITAL_COMMUNITY): Payer: BLUE CROSS/BLUE SHIELD

## 2016-02-23 ENCOUNTER — Emergency Department (HOSPITAL_COMMUNITY)
Admission: EM | Admit: 2016-02-23 | Discharge: 2016-02-24 | Disposition: A | Discharge status: Home / Self Care | Payer: BLUE CROSS/BLUE SHIELD | Attending: Emergency Medicine | Admitting: Emergency Medicine

## 2016-02-23 DIAGNOSIS — E119 Type 2 diabetes mellitus without complications: Secondary | ICD-10-CM | POA: Insufficient documentation

## 2016-02-23 DIAGNOSIS — Z7984 Long term (current) use of oral hypoglycemic drugs: Secondary | ICD-10-CM | POA: Diagnosis not present

## 2016-02-23 DIAGNOSIS — Z87891 Personal history of nicotine dependence: Secondary | ICD-10-CM | POA: Diagnosis not present

## 2016-02-23 DIAGNOSIS — R51 Headache: Secondary | ICD-10-CM | POA: Diagnosis not present

## 2016-02-23 DIAGNOSIS — R079 Chest pain, unspecified: Secondary | ICD-10-CM

## 2016-02-23 LAB — CBC
HEMATOCRIT: 44.8 % (ref 39.0–52.0)
HEMOGLOBIN: 15.1 g/dL (ref 13.0–17.0)
MCH: 27 pg (ref 26.0–34.0)
MCHC: 33.7 g/dL (ref 30.0–36.0)
MCV: 80.1 fL (ref 78.0–100.0)
Platelets: 216 10*3/uL (ref 150–400)
RBC: 5.59 MIL/uL (ref 4.22–5.81)
RDW: 13.9 % (ref 11.5–15.5)
WBC: 7 10*3/uL (ref 4.0–10.5)

## 2016-02-23 LAB — BASIC METABOLIC PANEL
ANION GAP: 7 (ref 5–15)
BUN: 17 mg/dL (ref 6–20)
CALCIUM: 8.9 mg/dL (ref 8.9–10.3)
CHLORIDE: 103 mmol/L (ref 101–111)
CO2: 23 mmol/L (ref 22–32)
Creatinine, Ser: 0.91 mg/dL (ref 0.61–1.24)
GFR calc Af Amer: >60 mL/min (ref >60)
GFR calc non Af Amer: >60 mL/min (ref >60)
GLUCOSE: 201 mg/dL — AB (ref 65–99)
POTASSIUM: 3.5 mmol/L (ref 3.5–5.1)
Sodium: 133 mmol/L — ABNORMAL LOW (ref 135–145)

## 2016-02-23 LAB — I-STAT TROPONIN, ED
Troponin i, poc: 0 ng/mL (ref 0.00–0.08)
Troponin i, poc: 0.01 ng/mL (ref 0.00–0.08)

## 2016-02-23 IMAGING — DX DG CHEST 2V
2 series · 2 of 2 positions shown · non-contrast
Comparison: [DATE]

CLINICAL DATA: Mid to left-sided chest pain for 3 months, worse
today. Right arm numbness. History of diabetes, former smoker.

EXAM:
CHEST  2 VIEW

[chest pa]
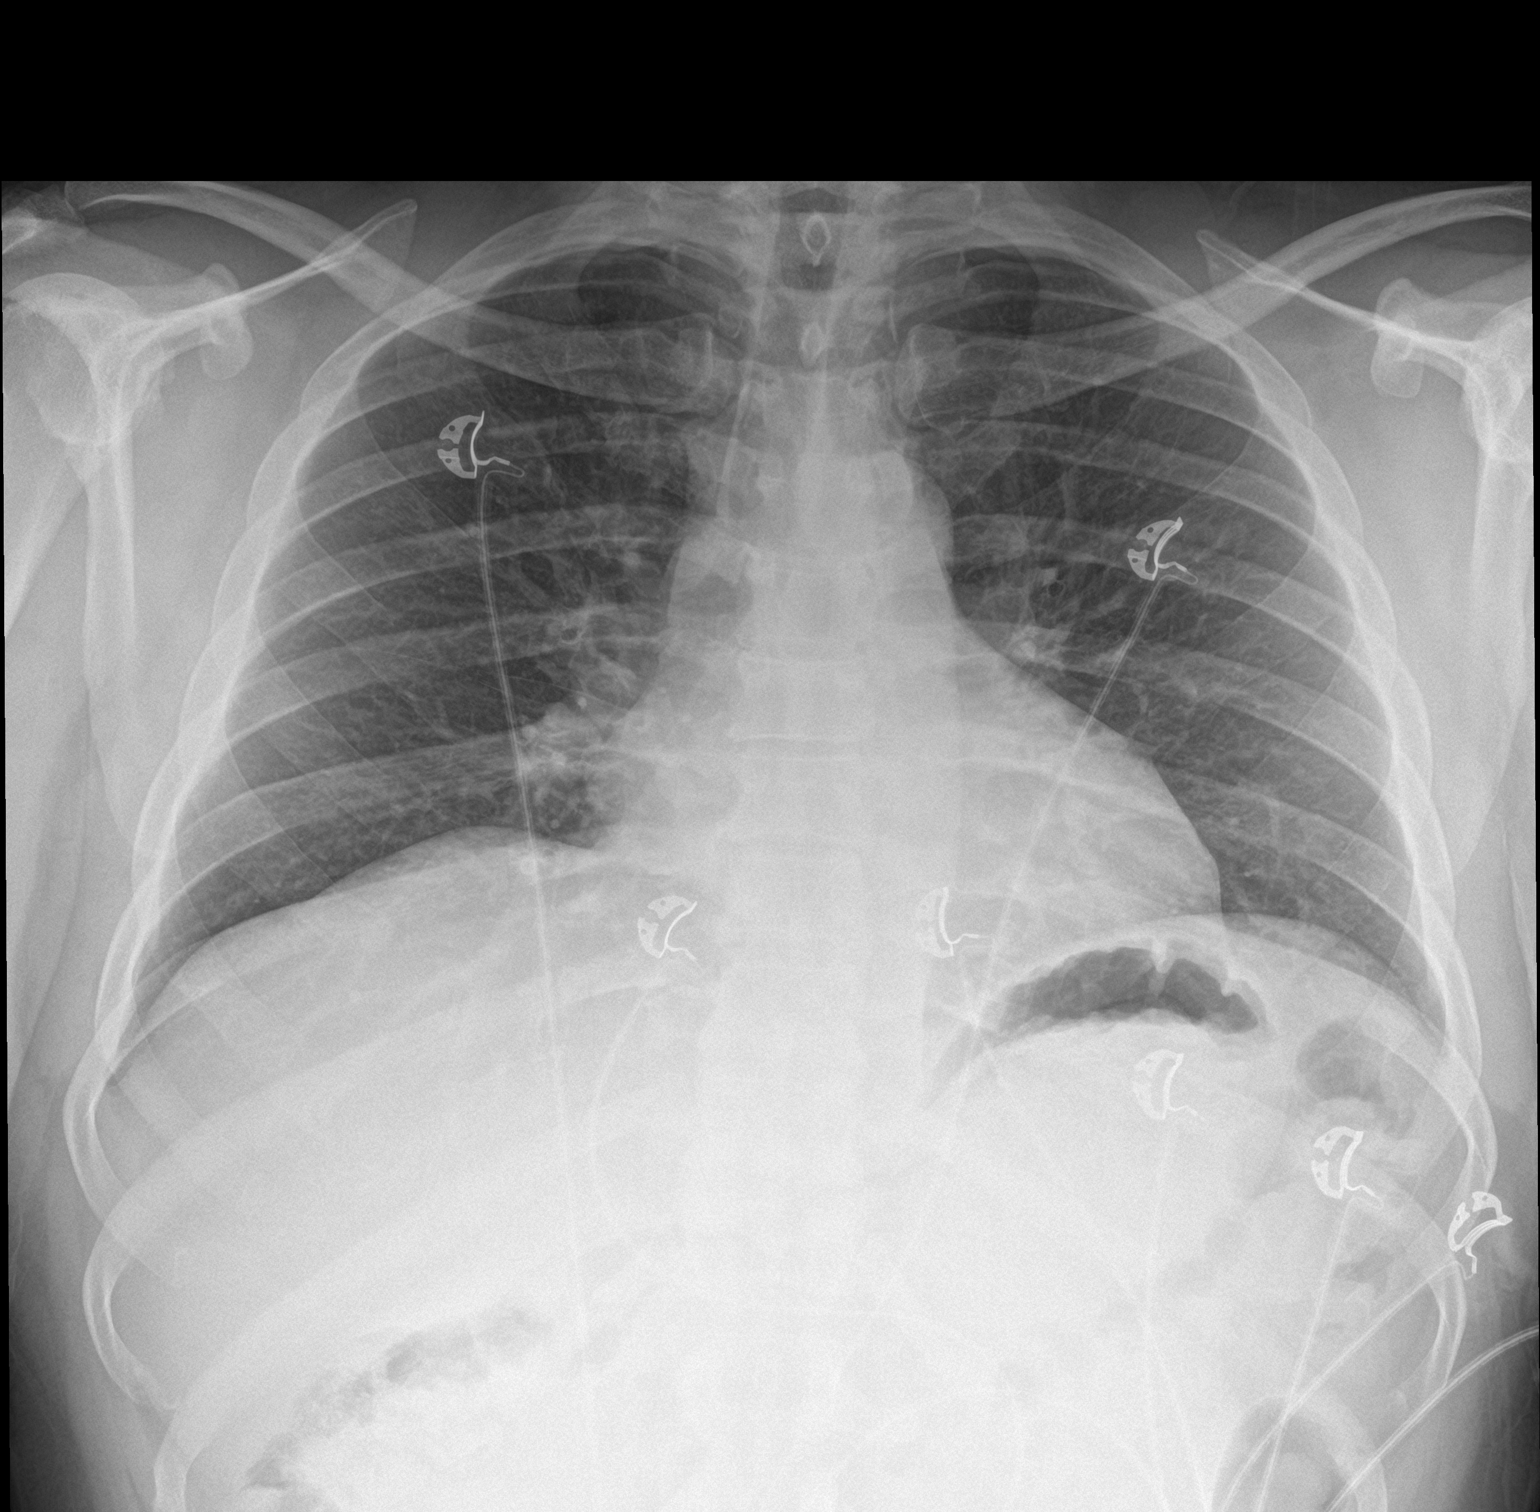

[chest lat]
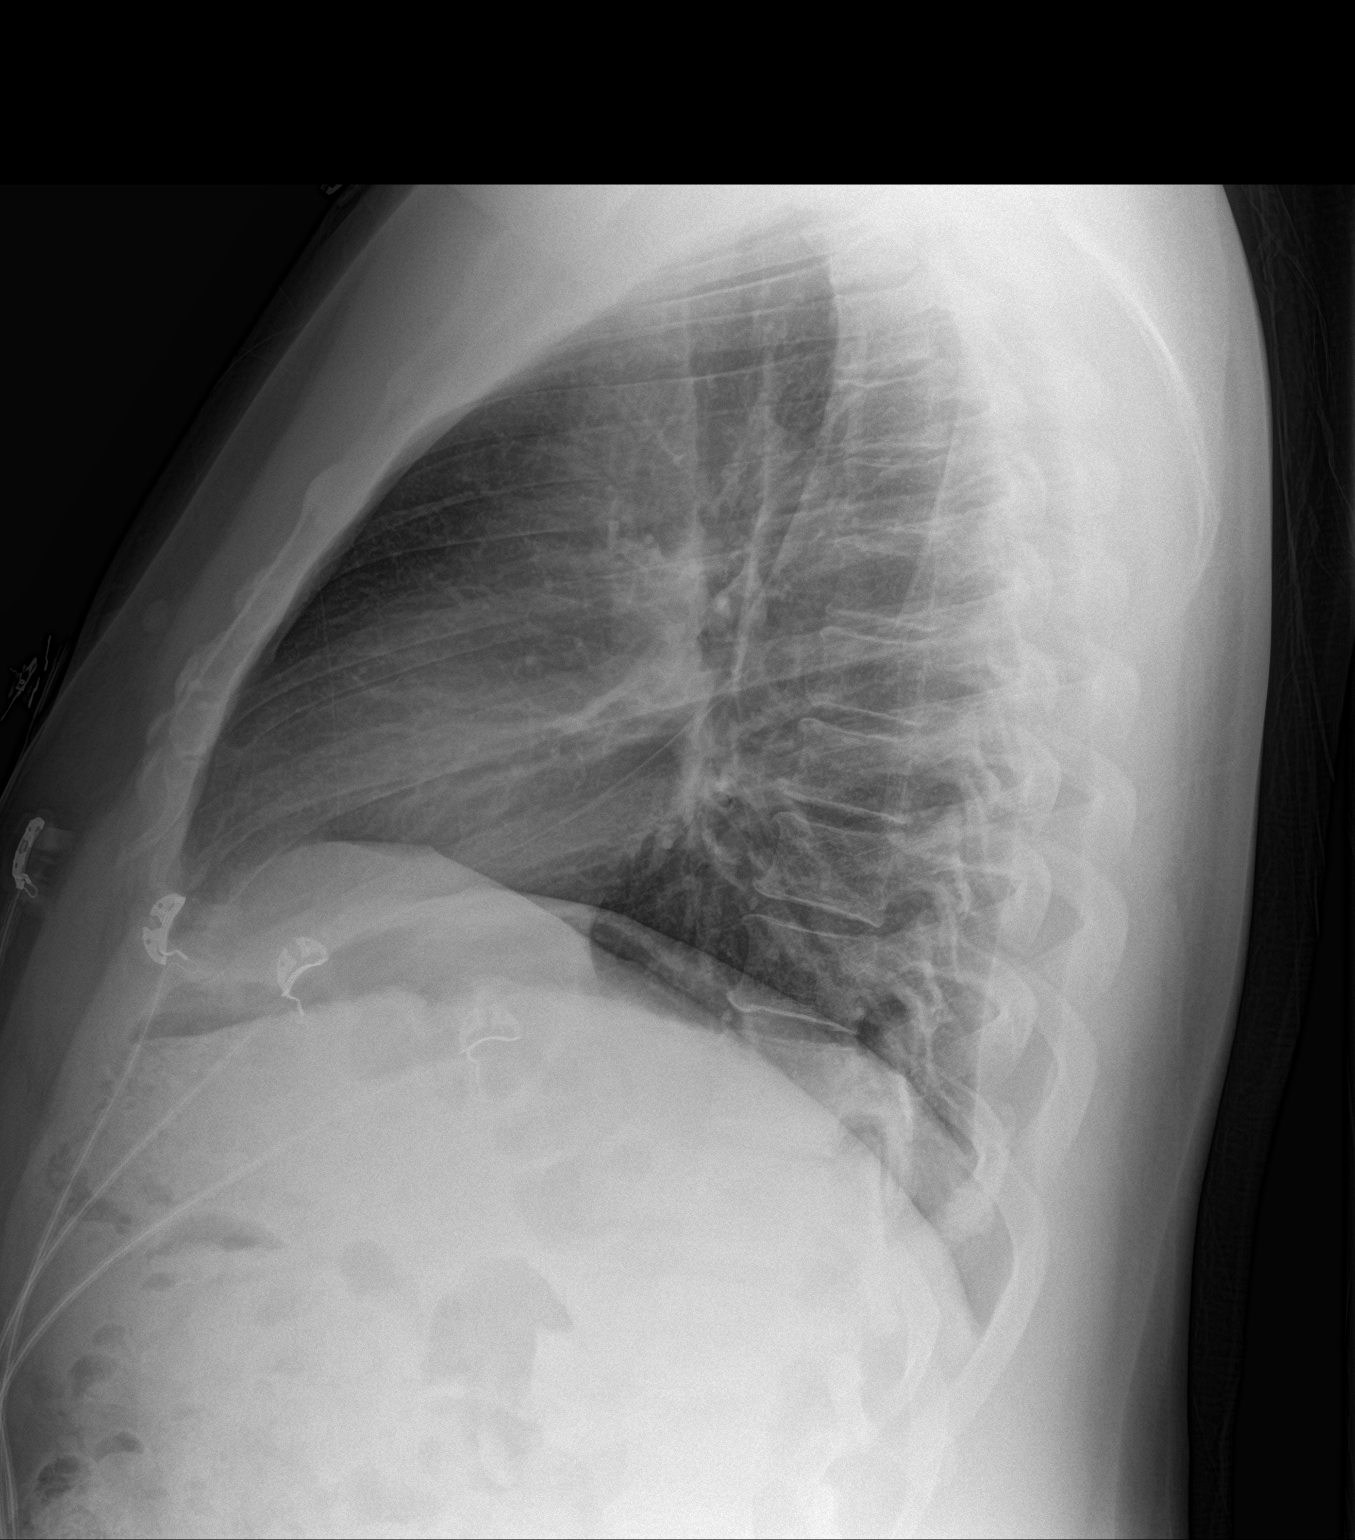

[2 of 2 positions shown; findings below may reference images not displayed]

FINDINGS: The heart size and mediastinal contours are within normal limits.
Both lungs are clear. The visualized skeletal structures are
unremarkable.
IMPRESSION: No active cardiopulmonary disease.

## 2016-02-23 MED ORDER — HYDROCODONE-ACETAMINOPHEN 5-325 MG PO TABS
1.0000 | ORAL_TABLET | Freq: Once | ORAL | Status: AC
  Administered 2016-02-23: 1 via ORAL
  Filled 2016-02-23: qty 1

## 2016-02-23 MED ORDER — ASPIRIN 81 MG PO CHEW
324.0000 mg | CHEWABLE_TABLET | Freq: Once | ORAL | Status: AC
  Administered 2016-02-23: 324 mg via ORAL
  Filled 2016-02-23: qty 4

## 2016-02-23 NOTE — ED Triage Notes (Signed)
Pt c/o left sided chest pain for a couple of day that got worse today.
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
## 2016-02-24 NOTE — Discharge Instructions (Signed)

## 2016-03-02 ENCOUNTER — Ambulatory Visit: Payer: BLUE CROSS/BLUE SHIELD | Admitting: Cardiology

## 2016-03-07 ENCOUNTER — Other Ambulatory Visit: Payer: Self-pay | Admitting: Family Medicine

## 2016-03-28 ENCOUNTER — Encounter: Payer: Self-pay | Admitting: Cardiology
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
## 2016-03-29 ENCOUNTER — Ambulatory Visit (INDEPENDENT_AMBULATORY_CARE_PROVIDER_SITE_OTHER): Payer: BLUE CROSS/BLUE SHIELD | Admitting: Cardiology

## 2016-03-29 ENCOUNTER — Encounter: Payer: Self-pay | Admitting: Cardiology

## 2016-03-29 ENCOUNTER — Encounter: Payer: Self-pay | Admitting: *Deleted

## 2016-03-29 VITALS — BP 106/73 | HR 68 | Ht 68.0 in | Wt 220.0 lb

## 2016-03-29 DIAGNOSIS — R9431 Abnormal electrocardiogram [ECG] [EKG]: Secondary | ICD-10-CM

## 2016-03-29 DIAGNOSIS — E119 Type 2 diabetes mellitus without complications: Secondary | ICD-10-CM

## 2016-03-29 DIAGNOSIS — R072 Precordial pain: Secondary | ICD-10-CM | POA: Diagnosis not present

## 2016-03-29 DIAGNOSIS — E785 Hyperlipidemia, unspecified: Secondary | ICD-10-CM

## 2016-03-29 DIAGNOSIS — R0789 Other chest pain: Secondary | ICD-10-CM

## 2016-03-29 NOTE — Patient Instructions (Signed)
Medication Instructions:  Continue all current medications.  Labwork: none  Testing/Procedures:  Your physician has requested that you have a stress echocardiogram. For further information please visit [email or web address]. Please follow instruction sheet as given.  Office will contact with results via phone or letter.    Follow-Up: To be determined based on test results.   Any Other Special Instructions Will Be Listed Below (If Applicable).  If you need a refill on your cardiac medications before your next appointment, please call your pharmacy.

## 2016-04-06 ENCOUNTER — Other Ambulatory Visit: Payer: Self-pay | Admitting: Family Medicine

## 2016-04-06 NOTE — Telephone Encounter (Signed)
Has not been seen in over a year Chart says he is on 20 BID ?

## 2016-04-07 ENCOUNTER — Telehealth: Payer: Self-pay | Admitting: *Deleted

## 2016-04-07 ENCOUNTER — Ambulatory Visit (HOSPITAL_COMMUNITY)
Admission: RE | Admit: 2016-04-07 | Discharge: 2016-04-07 | Disposition: A | Discharge status: Home / Self Care | Payer: BLUE CROSS/BLUE SHIELD | Source: Ambulatory Visit | Attending: Cardiology | Admitting: Cardiology

## 2016-04-07 DIAGNOSIS — R0789 Other chest pain: Secondary | ICD-10-CM | POA: Insufficient documentation

## 2016-04-07 DIAGNOSIS — R9431 Abnormal electrocardiogram [ECG] [EKG]: Secondary | ICD-10-CM | POA: Insufficient documentation

## 2016-04-07 LAB — ECHOCARDIOGRAM STRESS TEST
CHL CUP MPHR: 176 {beats}/min
CHL RATE OF PERCEIVED EXERTION: 17
CSEPED: 11 min
CSEPHR: 93 %
Estimated workload: 13.4 METS
Exercise duration (sec): 7 s
Peak HR: 164 {beats}/min
Rest HR: 72 {beats}/min

## 2016-04-07 NOTE — Progress Notes (Signed)
*  PRELIMINARY RESULTS* Echocardiogram Echocardiogram Stress Test has been performed.  Travis Delgado 04/07/2016, 10:13 AM

## 2016-04-07 NOTE — Telephone Encounter (Signed)
-----   Message from Satira Sark, MD sent at 04/07/2016 12:37 PM EDT ----- Results reviewed. Stress test was reassuring without evidence of ischemia and low risk by Duke treadmill score. No further cardiac testing anticipated at this time. A copy of this test should be forwarded to Tula Nakayama, MD.

## 2016-04-07 NOTE — Telephone Encounter (Signed)
Patient informed and copy sent to PCP. 

## 2016-05-06 ENCOUNTER — Other Ambulatory Visit: Payer: Self-pay | Admitting: Family Medicine

## 2016-06-05 ENCOUNTER — Other Ambulatory Visit: Payer: Self-pay | Admitting: Family Medicine

## 2016-07-05 ENCOUNTER — Other Ambulatory Visit: Payer: Self-pay | Admitting: Family Medicine

## 2016-08-04 ENCOUNTER — Other Ambulatory Visit: Payer: Self-pay | Admitting: Family Medicine

## 2016-09-03 ENCOUNTER — Other Ambulatory Visit: Payer: Self-pay | Admitting: Family Medicine

## 2016-12-25 ENCOUNTER — Ambulatory Visit (INDEPENDENT_AMBULATORY_CARE_PROVIDER_SITE_OTHER): Payer: BLUE CROSS/BLUE SHIELD | Admitting: Orthopedic Surgery

## 2016-12-25 ENCOUNTER — Ambulatory Visit (INDEPENDENT_AMBULATORY_CARE_PROVIDER_SITE_OTHER): Payer: BLUE CROSS/BLUE SHIELD

## 2016-12-25 ENCOUNTER — Telehealth: Payer: Self-pay | Admitting: Orthopedic Surgery

## 2016-12-25 ENCOUNTER — Encounter: Payer: Self-pay | Admitting: Orthopedic Surgery

## 2016-12-25 VITALS — BP 127/87 | HR 66 | Wt 220.0 lb

## 2016-12-25 DIAGNOSIS — M542 Cervicalgia: Secondary | ICD-10-CM

## 2016-12-25 DIAGNOSIS — M4722 Other spondylosis with radiculopathy, cervical region: Secondary | ICD-10-CM

## 2016-12-25 DIAGNOSIS — M7551 Bursitis of right shoulder: Secondary | ICD-10-CM

## 2016-12-25 DIAGNOSIS — M7552 Bursitis of left shoulder: Secondary | ICD-10-CM
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
## 2016-12-25 NOTE — Telephone Encounter (Signed)
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 Please advise. Patient ph# [phone]

## 2016-12-25 NOTE — Patient Instructions (Addendum)
You have received an injection of steroids into the joint. 15% of patients will have increased pain within the 24 hours postinjection.   This is transient and will go away.   We recommend that you use ice packs on the injection site for 20 minutes every 2 hours and extra strength Tylenol 2 tablets every 8 as needed until the pain resolves.  If you continue to have pain after taking the Tylenol and using the ice please call the office for further instructions.  Shoulder BURSITIS Shoulder BURSITIS is a condition that causes pain when connective tissues (tendons) surrounding the shoulder joint become pinched. These tendons are part of the group of muscles and tissues that help to stabilize the shoulder (rotator cuff). Beneath the rotator cuff is a fluid-filled sac (bursa) that allows the muscles and tendons to glide smoothly. The bursa may become swollen or irritated (bursitis). Bursitis, swelling in the rotator cuff tendons, or both conditions can decrease how much space is under a bone in the shoulder joint (acromion), resulting in impingement. What are the causes? Shoulder impingement syndrome can be caused by bursitis or swelling of the rotator cuff tendons, which may result from:  Repetitive overhead arm movements.  Falling onto the shoulder.  Weakness in the shoulder muscles.  What increases the risk? You may be more likely to develop this condition if you are an athlete who participates in:  Sports that involve throwing, such as baseball.  Tennis.  Swimming.  Volleyball.  Some people are also more likely to develop impingement syndrome because of the shape of their acromion bone. What are the signs or symptoms? The main symptom of this condition is pain on the front or side of the shoulder. Pain may:  Get worse when lifting or raising the arm.  Get worse at night.  Wake you up from sleeping.  Feel sharp when the shoulder is moved, and then fade to an ache.  Other signs  and symptoms may include:  Tenderness.  Stiffness.  Inability to raise the arm above shoulder level or behind the body.  Weakness.  How is this diagnosed? This condition may be diagnosed based on:  Your symptoms.  Your medical history.  A physical exam.  Imaging tests, such as: ? X-rays. ? MRI. ? Ultrasound.  How is this treated? Treatment for this condition may include:  Resting your shoulder and avoiding all activities that cause pain or put stress on the shoulder.  Icing your shoulder.  NSAIDs to help reduce pain and swelling.  One or more injections of medicines to numb the area and reduce inflammation.  Physical therapy.  Surgery. This may be needed if nonsurgical treatments have not helped. Surgery may involve repairing the rotator cuff, reshaping the acromion, or removing the bursa.  Follow these instructions at home: Managing pain, stiffness, and swelling  If directed, apply ice to the injured area. ? Put ice in a plastic bag. ? Place a towel between your skin and the bag. ? Leave the ice on for 20 minutes, 2-3 times a day. Activity  Rest and return to your normal activities as told by your health care provider. Ask your health care provider what activities are safe for you.  Do exercises as told by your health care provider. General instructions  Do not use any tobacco products, including cigarettes, chewing tobacco, or e-cigarettes. Tobacco can delay healing. If you need help quitting, ask your health care provider.  Ask your health care provider when it is safe  for you to drive.  Take over-the-counter and prescription medicines only as told by your health care provider.  Keep all follow-up visits as told by your health care provider. This is important. How is this prevented?  Give your body time to rest between periods of activity.  Be safe and responsible while being active to avoid falls.  Maintain physical fitness, including strength and  flexibility. Contact a health care provider if:  Your symptoms have not improved after 1-2 months of treatment and rest.  You cannot lift your arm away from your body. This information is not intended to replace advice given to you by your health care provider. Make sure you discuss any questions you have with your health care provider. Document Released: 06/26/2005 Document Revised: 03/02/2016 Document Reviewed: 05/29/2015 Elsevier Interactive Patient Education  2018 [employer].  Cervical Radiculopathy Cervical radiculopathy means that a nerve in the neck is pinched or bruised. This can cause pain or loss of feeling (numbness) that runs from your neck to your arm and fingers. Follow these instructions at home: Managing pain  Take over-the-counter and prescription medicines only as told by your doctor.  If directed, put ice on the injured or painful area. ? Put ice in a plastic bag. ? Place a towel between your skin and the bag. ? Leave the ice on for 20 minutes, 2-3 times per day.  If ice does not help, you can try using heat. Take a warm shower or warm bath, or use a heat pack as told by your doctor.  You may try a gentle neck and shoulder massage. Activity  Rest as needed. Follow instructions from your doctor about any activities to avoid.  Do exercises as told by your doctor or physical therapist. General instructions  If you were given a soft collar, wear it as told by your doctor.  Use a flat pillow when you sleep.  Keep all follow-up visits as told by your doctor. This is important. Contact a doctor if:  Your condition does not improve with treatment. Get help right away if:  Your pain gets worse and is not controlled with medicine.  You lose feeling or feel weak in your hand, arm, face, or leg.  You have a fever.  You have a stiff neck.  You cannot control when you poop or pee (have incontinence).  You have trouble with walking, balance, or  talking. This information is not intended to replace advice given to you by your health care provider. Make sure you discuss any questions you have with your health care provider. Document Released: 06/15/2011 Document Revised: 12/02/2015 Document Reviewed: 08/20/2014 Elsevier Interactive Patient Education  [employer].

## 2016-12-25 NOTE — Addendum Note (Signed)
Addended by: Christia Reading on: 12/25/2016 09:33 AM   Modules accepted: Orders

## 2016-12-25 NOTE — Telephone Encounter (Signed)
yes

## 2016-12-26 NOTE — Telephone Encounter (Signed)
Called patient; notified. States doing well.

## 2017-01-01 NOTE — Addendum Note (Signed)
Addended by: Baldomero Lamy B on: 01/01/2017 10:35 AM   Modules accepted: Orders

## 2017-01-19 ENCOUNTER — Ambulatory Visit (HOSPITAL_COMMUNITY): Payer: BLUE CROSS/BLUE SHIELD | Admitting: Occupational Therapy

## 2017-01-31 ENCOUNTER — Ambulatory Visit (HOSPITAL_COMMUNITY): Payer: BLUE CROSS/BLUE SHIELD | Attending: Internal Medicine | Admitting: Specialist

## 2017-01-31 ENCOUNTER — Encounter (HOSPITAL_COMMUNITY): Payer: Self-pay | Admitting: Specialist

## 2017-01-31 DIAGNOSIS — M25512 Pain in left shoulder: Secondary | ICD-10-CM | POA: Diagnosis not present

## 2017-01-31 DIAGNOSIS — M25611 Stiffness of right shoulder, not elsewhere classified: Secondary | ICD-10-CM | POA: Insufficient documentation

## 2017-01-31 DIAGNOSIS — M25612 Stiffness of left shoulder, not elsewhere classified: Secondary | ICD-10-CM | POA: Insufficient documentation

## 2017-01-31 DIAGNOSIS — G8929 Other chronic pain: Secondary | ICD-10-CM

## 2017-01-31 DIAGNOSIS — M25511 Pain in right shoulder: Secondary | ICD-10-CM | POA: Diagnosis present
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
## 2017-01-31 NOTE — Patient Instructions (Signed)

## 2017-02-15 ENCOUNTER — Encounter (HOSPITAL_COMMUNITY): Payer: Self-pay

## 2017-02-15 ENCOUNTER — Ambulatory Visit (HOSPITAL_COMMUNITY): Payer: BLUE CROSS/BLUE SHIELD | Attending: Internal Medicine

## 2017-02-15 DIAGNOSIS — M25511 Pain in right shoulder: Secondary | ICD-10-CM | POA: Insufficient documentation

## 2017-02-15 DIAGNOSIS — M25512 Pain in left shoulder: Secondary | ICD-10-CM | POA: Insufficient documentation

## 2017-02-15 DIAGNOSIS — G8929 Other chronic pain: Secondary | ICD-10-CM | POA: Insufficient documentation

## 2017-02-15 DIAGNOSIS — M25612 Stiffness of left shoulder, not elsewhere classified: Secondary | ICD-10-CM | POA: Diagnosis present

## 2017-02-15 DIAGNOSIS — M25611 Stiffness of right shoulder, not elsewhere classified: Secondary | ICD-10-CM | POA: Diagnosis present
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
## 2017-02-19 ENCOUNTER — Ambulatory Visit (HOSPITAL_COMMUNITY): Payer: BLUE CROSS/BLUE SHIELD

## 2017-02-19 ENCOUNTER — Encounter (HOSPITAL_COMMUNITY): Payer: Self-pay

## 2017-02-19 DIAGNOSIS — M25512 Pain in left shoulder: Secondary | ICD-10-CM | POA: Diagnosis not present

## 2017-02-19 DIAGNOSIS — M25612 Stiffness of left shoulder, not elsewhere classified: Secondary | ICD-10-CM

## 2017-02-19 DIAGNOSIS — M25611 Stiffness of right shoulder, not elsewhere classified: Secondary | ICD-10-CM

## 2017-02-19 DIAGNOSIS — M25511 Pain in right shoulder: Secondary | ICD-10-CM

## 2017-02-19 DIAGNOSIS — G8929 Other chronic pain: Secondary | ICD-10-CM
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
## 2017-02-22 ENCOUNTER — Encounter (HOSPITAL_COMMUNITY): Payer: Self-pay

## 2017-02-22 ENCOUNTER — Ambulatory Visit (HOSPITAL_COMMUNITY): Payer: BLUE CROSS/BLUE SHIELD

## 2017-02-22 DIAGNOSIS — M25611 Stiffness of right shoulder, not elsewhere classified: Secondary | ICD-10-CM

## 2017-02-22 DIAGNOSIS — M25512 Pain in left shoulder: Secondary | ICD-10-CM | POA: Diagnosis not present

## 2017-02-22 DIAGNOSIS — M25612 Stiffness of left shoulder, not elsewhere classified: Secondary | ICD-10-CM

## 2017-02-22 DIAGNOSIS — M25511 Pain in right shoulder: Secondary | ICD-10-CM

## 2017-02-22 DIAGNOSIS — G8929 Other chronic pain: Secondary | ICD-10-CM
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
## 2017-02-26 ENCOUNTER — Ambulatory Visit (HOSPITAL_COMMUNITY): Payer: BLUE CROSS/BLUE SHIELD | Admitting: Specialist

## 2017-02-26 ENCOUNTER — Telehealth (HOSPITAL_COMMUNITY): Payer: Self-pay | Admitting: Internal Medicine

## 2017-02-26 NOTE — Telephone Encounter (Signed)
02/26/17  Pt just said that he wouldn't be coming in today

## 2017-03-01 ENCOUNTER — Telehealth (HOSPITAL_COMMUNITY): Payer: Self-pay | Admitting: Internal Medicine

## 2017-03-01 ENCOUNTER — Ambulatory Visit (HOSPITAL_COMMUNITY): Payer: BLUE CROSS/BLUE SHIELD | Admitting: Occupational Therapy

## 2017-03-01 NOTE — Telephone Encounter (Signed)
03/01/17  pt. called to say that his back was really hurting and he would just not come today and start back next week

## 2017-03-02 ENCOUNTER — Telehealth (HOSPITAL_COMMUNITY): Payer: Self-pay | Admitting: Specialist

## 2017-03-02 NOTE — Telephone Encounter (Signed)
l/m  request pt to call us back to reschedule this apptment. Informed pt this apptment will be cx and needs to be rescheduled. NF 03/02/17

## 2017-03-05 ENCOUNTER — Ambulatory Visit (HOSPITAL_COMMUNITY): Payer: BLUE CROSS/BLUE SHIELD | Admitting: Specialist

## 2017-03-08 ENCOUNTER — Encounter (HOSPITAL_COMMUNITY): Payer: Self-pay

## 2017-03-08 ENCOUNTER — Ambulatory Visit (HOSPITAL_COMMUNITY): Payer: BLUE CROSS/BLUE SHIELD

## 2017-03-08 DIAGNOSIS — M25612 Stiffness of left shoulder, not elsewhere classified: Secondary | ICD-10-CM

## 2017-03-08 DIAGNOSIS — M25511 Pain in right shoulder: Secondary | ICD-10-CM

## 2017-03-08 DIAGNOSIS — M25611 Stiffness of right shoulder, not elsewhere classified: Secondary | ICD-10-CM

## 2017-03-08 DIAGNOSIS — G8929 Other chronic pain: Secondary | ICD-10-CM

## 2017-03-08 DIAGNOSIS — M25512 Pain in left shoulder: Secondary | ICD-10-CM | POA: Diagnosis not present
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
## 2017-03-08 NOTE — Patient Instructions (Signed)

## 2017-03-15 ENCOUNTER — Encounter (HOSPITAL_COMMUNITY): Payer: Self-pay

## 2017-03-15 ENCOUNTER — Ambulatory Visit (HOSPITAL_COMMUNITY): Payer: BLUE CROSS/BLUE SHIELD | Attending: Internal Medicine

## 2017-03-15 DIAGNOSIS — M25511 Pain in right shoulder: Secondary | ICD-10-CM | POA: Diagnosis present

## 2017-03-15 DIAGNOSIS — M25512 Pain in left shoulder: Secondary | ICD-10-CM | POA: Insufficient documentation

## 2017-03-15 DIAGNOSIS — G8929 Other chronic pain: Secondary | ICD-10-CM

## 2017-03-15 DIAGNOSIS — M25612 Stiffness of left shoulder, not elsewhere classified: Secondary | ICD-10-CM

## 2017-03-15 DIAGNOSIS — M25611 Stiffness of right shoulder, not elsewhere classified: Secondary | ICD-10-CM | POA: Diagnosis present
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
## 2017-03-15 NOTE — Patient Instructions (Signed)
Completed each exercise 12-15 reps. Complete 1-2 times a day.   ELASTIC BAND SHOULDER ABDUCTION  While holding an elastic band at your side, draw up your arm to the side keeping your elbow straight.    ELASTIC BAND SHOULDER ADDUCTION  While holding an elastic band away from your side, pull the band towards your side. Keep your elbow straight.     Strengthening: Resisted Horizontal Abduction   Hold tubing in right hand, elbow straight, arm in, parallel to floor. Pull arm out from side through pain-free range. Repeat ____ times per set. Do ____ sets per session. Do ____ sessions per day.  http://orth.exer.us/836   Copyright  VHI. All rights reserved.

## 2017-03-19 ENCOUNTER — Encounter (HOSPITAL_COMMUNITY): Payer: BLUE CROSS/BLUE SHIELD | Admitting: Specialist

## 2017-03-22 ENCOUNTER — Encounter (HOSPITAL_COMMUNITY): Payer: BLUE CROSS/BLUE SHIELD

## 2017-03-26 ENCOUNTER — Encounter (HOSPITAL_COMMUNITY): Payer: BLUE CROSS/BLUE SHIELD | Admitting: Specialist

## 2017-08-06 ENCOUNTER — Ambulatory Visit (INDEPENDENT_AMBULATORY_CARE_PROVIDER_SITE_OTHER): Payer: BLUE CROSS/BLUE SHIELD | Admitting: Otolaryngology

## 2017-08-06 DIAGNOSIS — R430 Anosmia: Secondary | ICD-10-CM

## 2017-08-06 DIAGNOSIS — J310 Chronic rhinitis: Secondary | ICD-10-CM

## 2017-08-06 DIAGNOSIS — J343 Hypertrophy of nasal turbinates: Secondary | ICD-10-CM

## 2017-08-17 ENCOUNTER — Emergency Department (HOSPITAL_COMMUNITY): Payer: BLUE CROSS/BLUE SHIELD

## 2017-08-17 ENCOUNTER — Emergency Department (HOSPITAL_COMMUNITY)
Admission: EM | Admit: 2017-08-17 | Discharge: 2017-08-17 | Disposition: A | Discharge status: Home / Self Care | Payer: BLUE CROSS/BLUE SHIELD | Attending: Emergency Medicine | Admitting: Emergency Medicine

## 2017-08-17 ENCOUNTER — Encounter (HOSPITAL_COMMUNITY): Payer: Self-pay

## 2017-08-17 DIAGNOSIS — Z7984 Long term (current) use of oral hypoglycemic drugs: Secondary | ICD-10-CM | POA: Insufficient documentation

## 2017-08-17 DIAGNOSIS — R69 Illness, unspecified: Secondary | ICD-10-CM

## 2017-08-17 DIAGNOSIS — J111 Influenza due to unidentified influenza virus with other respiratory manifestations: Secondary | ICD-10-CM | POA: Insufficient documentation

## 2017-08-17 DIAGNOSIS — Z87891 Personal history of nicotine dependence: Secondary | ICD-10-CM | POA: Diagnosis not present

## 2017-08-17 DIAGNOSIS — E119 Type 2 diabetes mellitus without complications: Secondary | ICD-10-CM | POA: Insufficient documentation

## 2017-08-17 DIAGNOSIS — R05 Cough: Secondary | ICD-10-CM | POA: Diagnosis present

## 2017-08-17 DIAGNOSIS — Z79899 Other long term (current) drug therapy: Secondary | ICD-10-CM | POA: Diagnosis not present

## 2017-08-17 LAB — URINALYSIS, ROUTINE W REFLEX MICROSCOPIC
Bacteria, UA: NONE SEEN
Bilirubin Urine: NEGATIVE
HGB URINE DIPSTICK: NEGATIVE
KETONES UR: 20 mg/dL — AB
LEUKOCYTES UA: NEGATIVE
NITRITE: NEGATIVE
PH: 5 (ref 5.0–8.0)
Protein, ur: NEGATIVE mg/dL
Specific Gravity, Urine: 1.035 — ABNORMAL HIGH (ref 1.005–1.030)
Squamous Epithelial / LPF: NONE SEEN

## 2017-08-17 LAB — CBG MONITORING, ED: Glucose-Capillary: 215 mg/dL — ABNORMAL HIGH (ref 65–99)

## 2017-08-17 IMAGING — DX DG CHEST 2V
2 series · 2 of 2 positions shown · non-contrast
Comparison: Chest radiograph performed [DATE]

CLINICAL DATA: Acute onset of cough, congestion, body aches,
headache and nausea.

EXAM:
CHEST  2 VIEW

[chest pa]
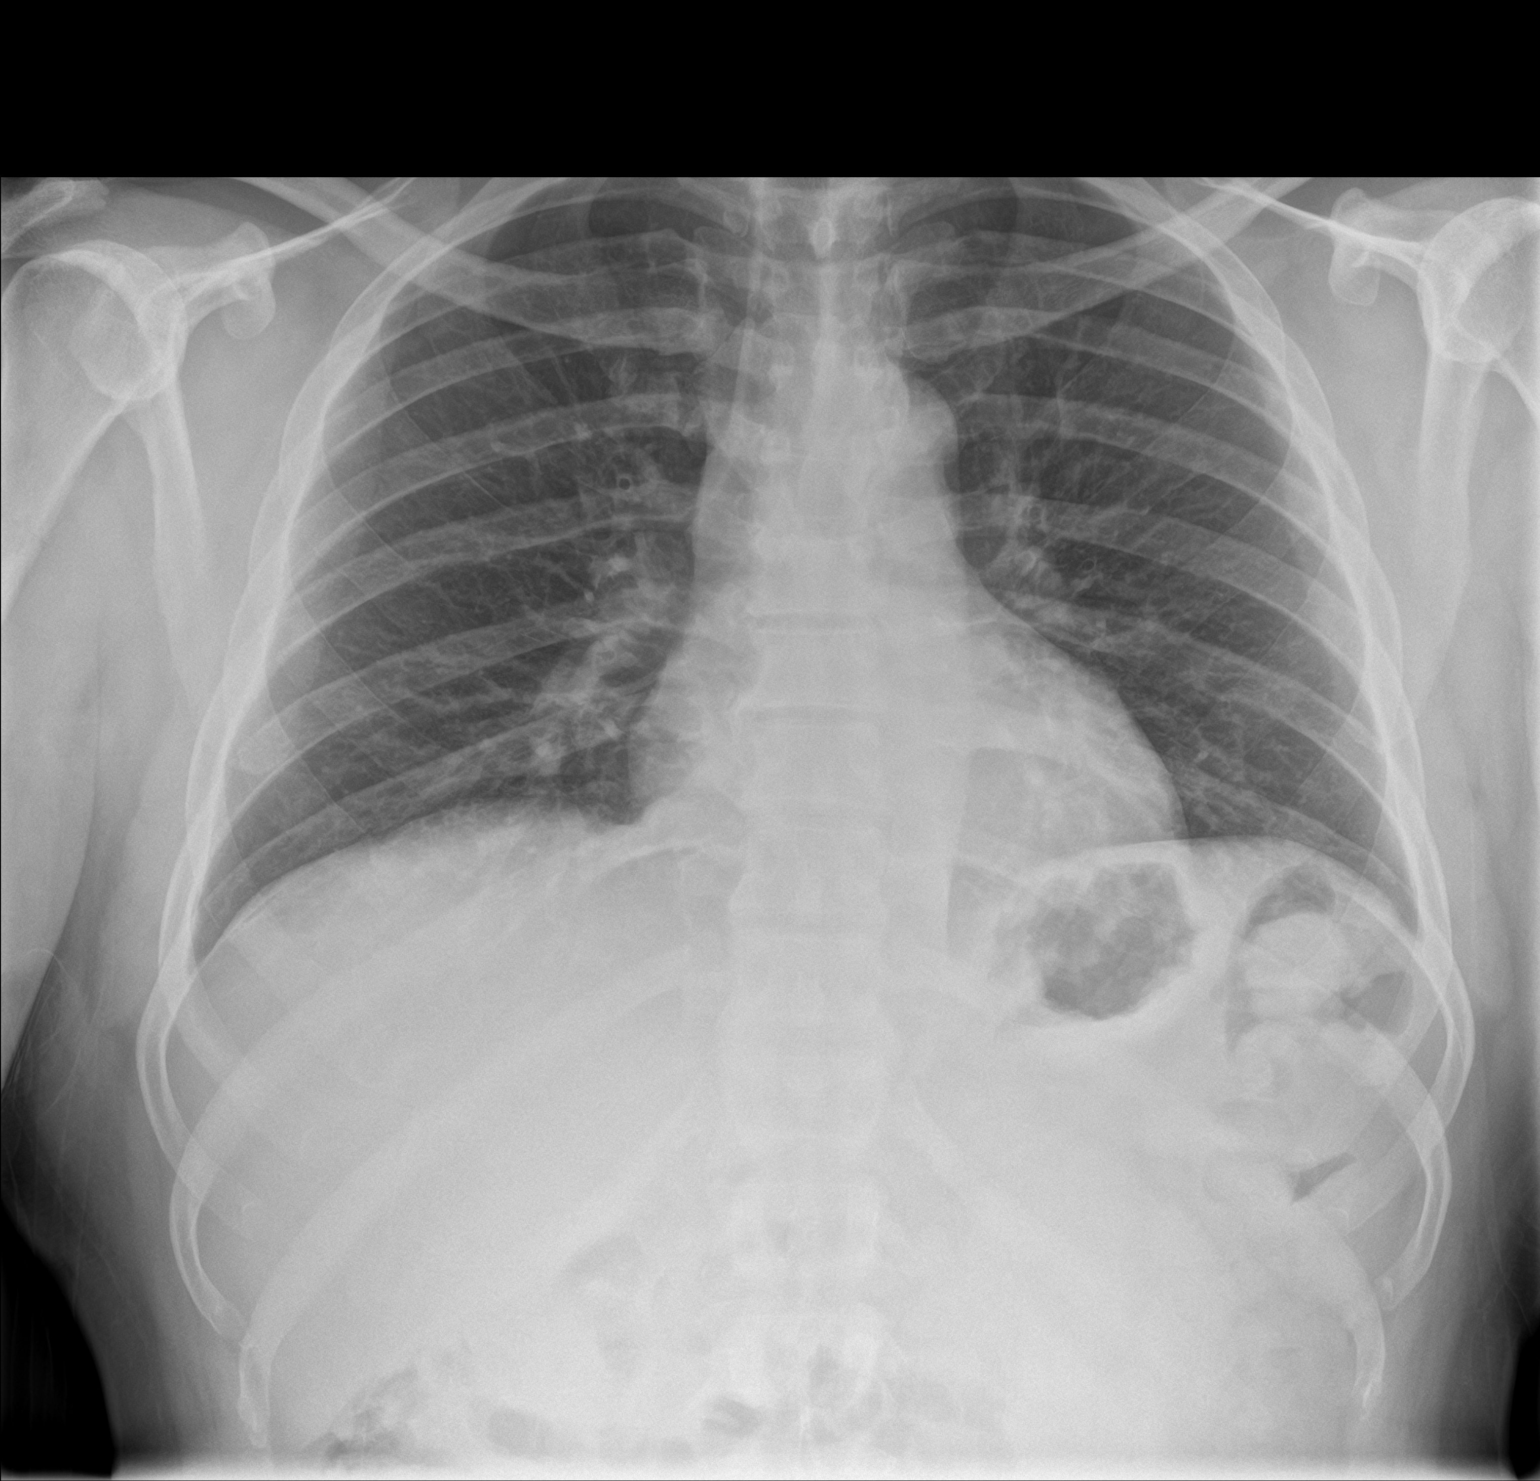

[chest lat]
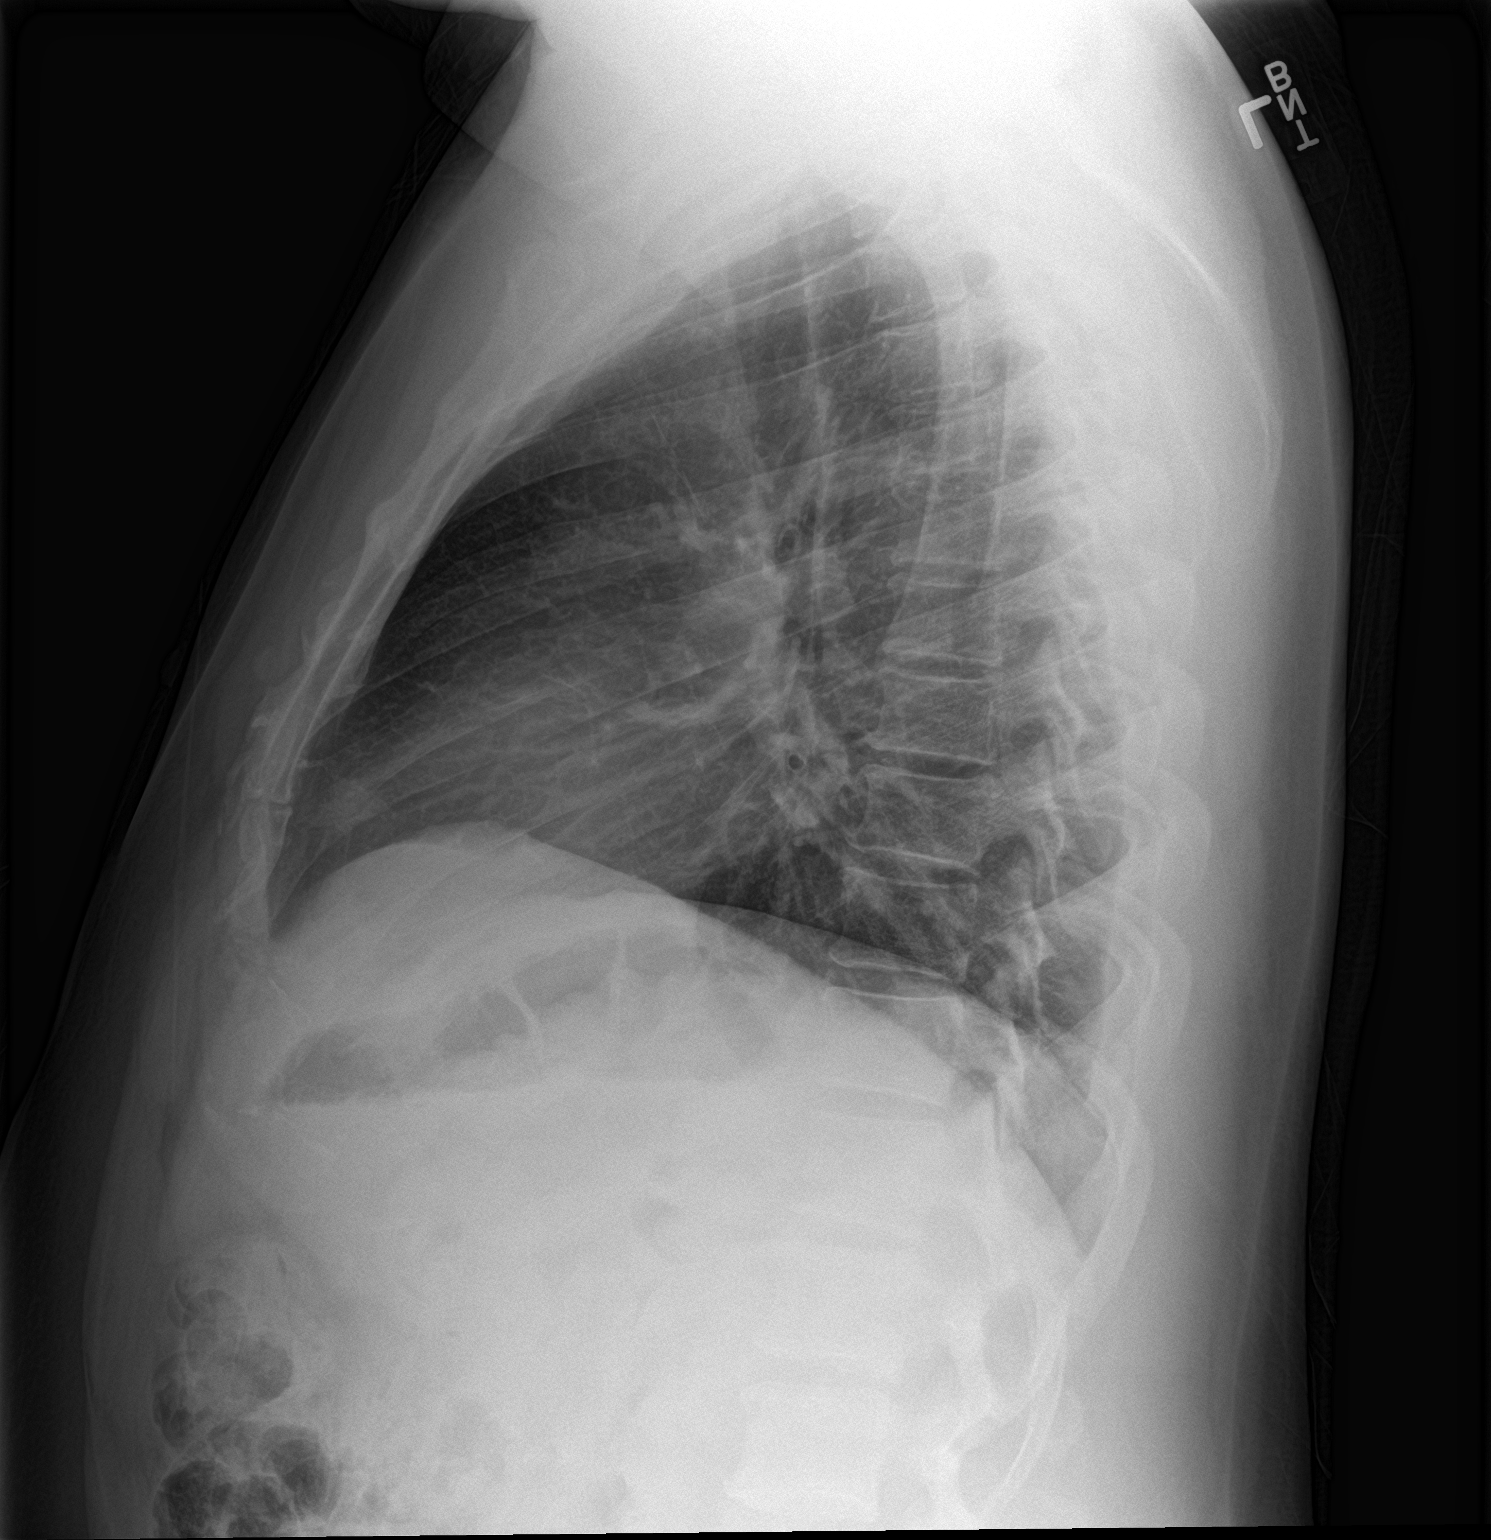

[2 of 2 positions shown; findings below may reference images not displayed]

FINDINGS: The lungs are well-aerated and clear. There is no evidence of focal
opacification, pleural effusion or pneumothorax.

The heart is normal in size; the mediastinal contour is within
normal limits. No acute osseous abnormalities are seen.
IMPRESSION: No acute cardiopulmonary process seen.

## 2017-08-17 MED ORDER — OSELTAMIVIR PHOSPHATE 75 MG PO CAPS: 75.0000 mg | ORAL_CAPSULE | Freq: Two times a day (BID) | ORAL | 0 refills | Status: DC

## 2017-08-17 MED ORDER — OSELTAMIVIR PHOSPHATE 75 MG PO CAPS
75.0000 mg | ORAL_CAPSULE | Freq: Once | ORAL | Status: AC
  Administered 2017-08-17: 75 mg via ORAL
  Filled 2017-08-17: qty 1

## 2017-08-17 NOTE — ED Triage Notes (Signed)
Pt reports cough, congestion, body aches, headache, nausea, no vomiting or diarrhea.

## 2017-08-17 NOTE — Discharge Instructions (Signed)
Keep yourself hydrated.  Use Tylenol or ibuprofen as needed for aches and fever.  Monitor your blood sugars closely.  Return to the ED if you develop new or worsening symptoms.
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
## 2017-08-17 NOTE — ED Notes (Signed)
Patient transported to X-ray 

## 2017-09-17 ENCOUNTER — Ambulatory Visit (INDEPENDENT_AMBULATORY_CARE_PROVIDER_SITE_OTHER): Payer: BLUE CROSS/BLUE SHIELD | Admitting: Otolaryngology

## 2017-09-17 DIAGNOSIS — J342 Deviated nasal septum: Secondary | ICD-10-CM

## 2017-09-17 DIAGNOSIS — R430 Anosmia: Secondary | ICD-10-CM | POA: Diagnosis not present

## 2017-09-17 DIAGNOSIS — J343 Hypertrophy of nasal turbinates: Secondary | ICD-10-CM

## 2017-09-17 DIAGNOSIS — J310 Chronic rhinitis: Secondary | ICD-10-CM | POA: Diagnosis not present

## 2017-09-18 ENCOUNTER — Other Ambulatory Visit (INDEPENDENT_AMBULATORY_CARE_PROVIDER_SITE_OTHER): Payer: Self-pay | Admitting: Otolaryngology

## 2017-09-18 DIAGNOSIS — J329 Chronic sinusitis, unspecified: Secondary | ICD-10-CM

## 2017-09-28 ENCOUNTER — Ambulatory Visit (HOSPITAL_COMMUNITY)
Admission: RE | Admit: 2017-09-28 | Discharge: 2017-09-28 | Disposition: A | Discharge status: Home / Self Care | Payer: BLUE CROSS/BLUE SHIELD | Source: Ambulatory Visit | Attending: Otolaryngology | Admitting: Otolaryngology

## 2017-09-28 DIAGNOSIS — J329 Chronic sinusitis, unspecified: Secondary | ICD-10-CM | POA: Diagnosis not present

## 2017-09-28 IMAGING — CT CT MAXILLOFACIAL W/O CM
3 series · 14 of 47 positions shown, 16 images · non-contrast
Comparison: None.

CLINICAL DATA: Recurrent sinusitis.  Anosmia and loss of taste.

EXAM:
CT MAXILLOFACIAL WITHOUT CONTRAST
TECHNIQUE: Multidetector CT images of the paranasal sinuses were obtained using
the standard protocol without intravenous contrast.

[Series 2: standard · axial · 0.39mm/px · z∈[-402,-284]mm · 8 of 138 slices shown, 10 images]
[im 10/138  brain]
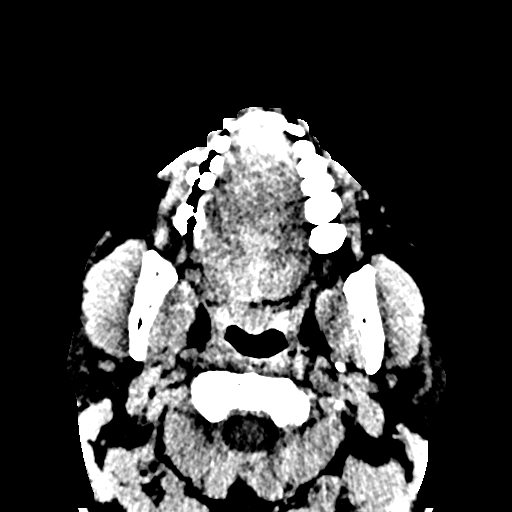
[im 10/138  bone]
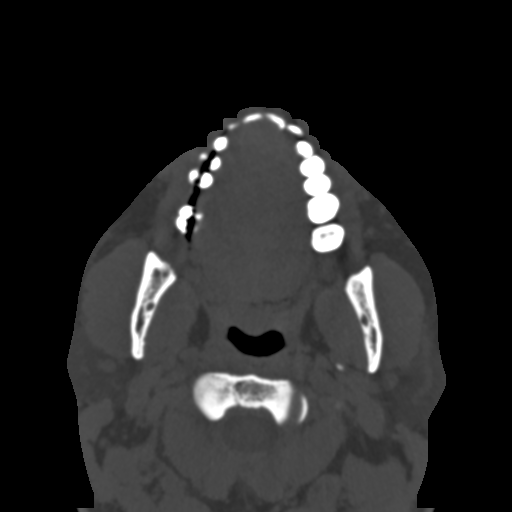
[im 29/138  bone]
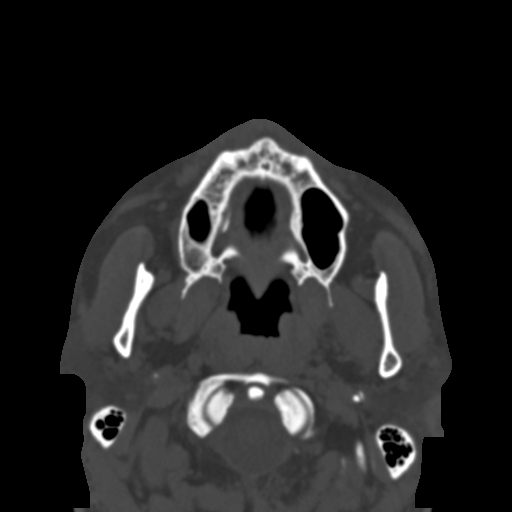
[im 43/138  bone]
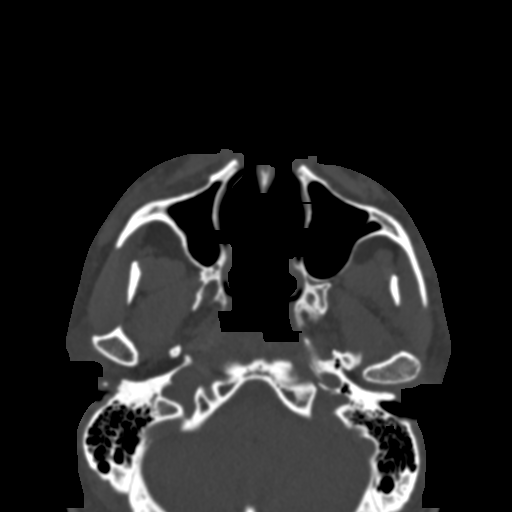
[im 62/138  bone]
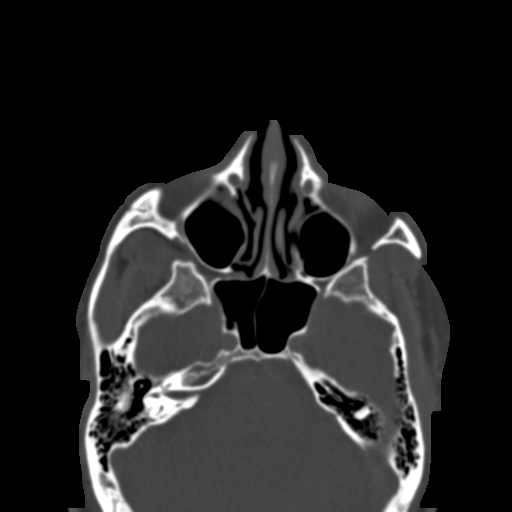
[im 76/138  brain]
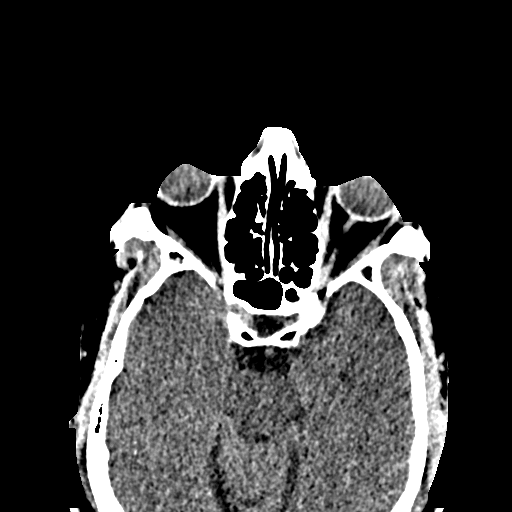
[im 76/138  bone]
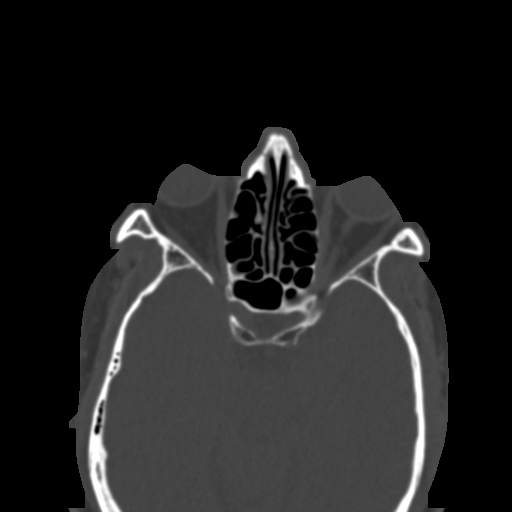
[im 95/138  bone]
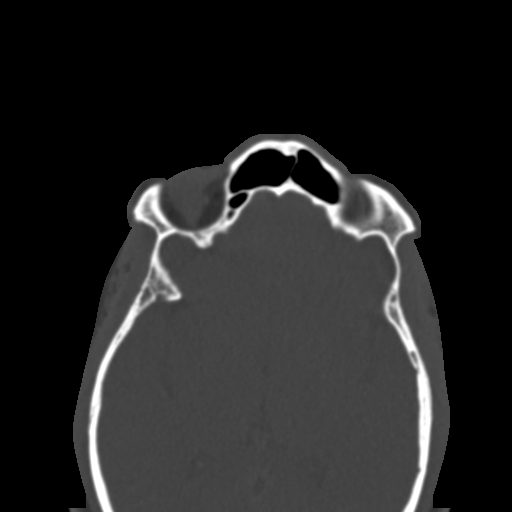
[im 109/138  bone]
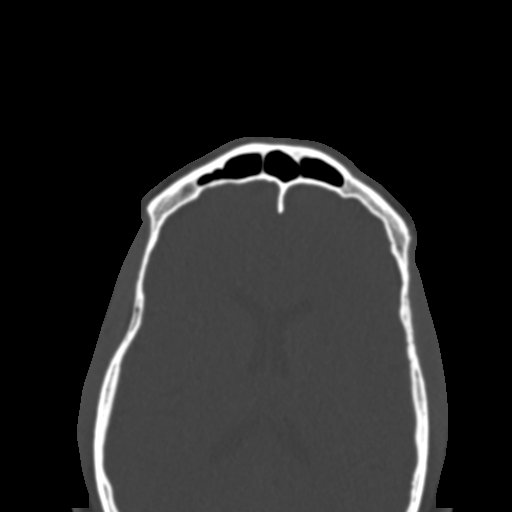
[im 128/138  bone]
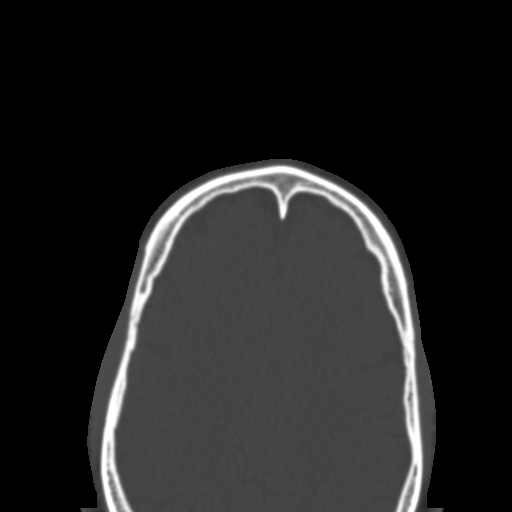

[Series 4: coronal · coronal · 0.29mm/px · 3 of 85 slices shown]
[im 29/85  bone]
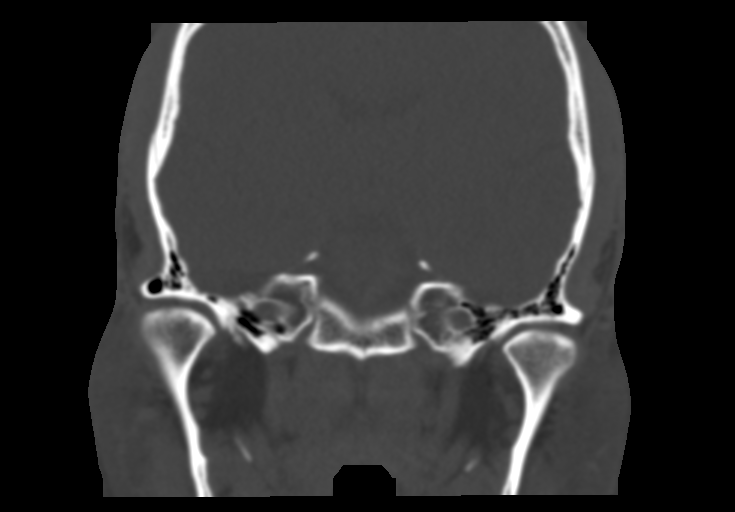
[im 38/85  bone]
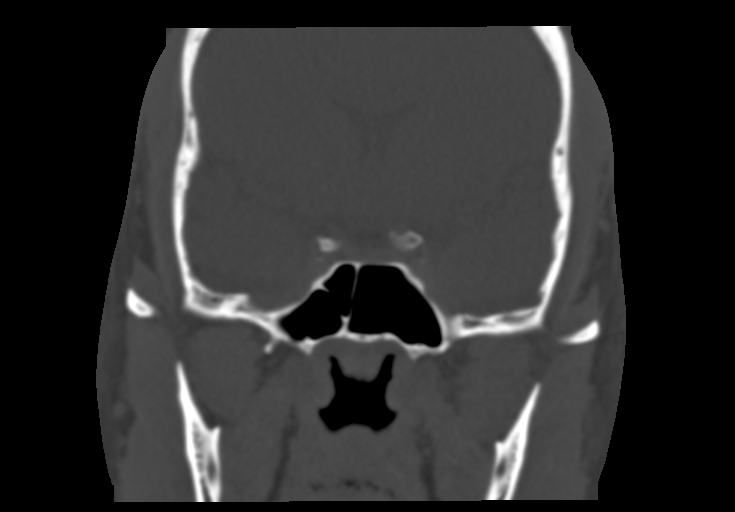
[im 47/85  bone]
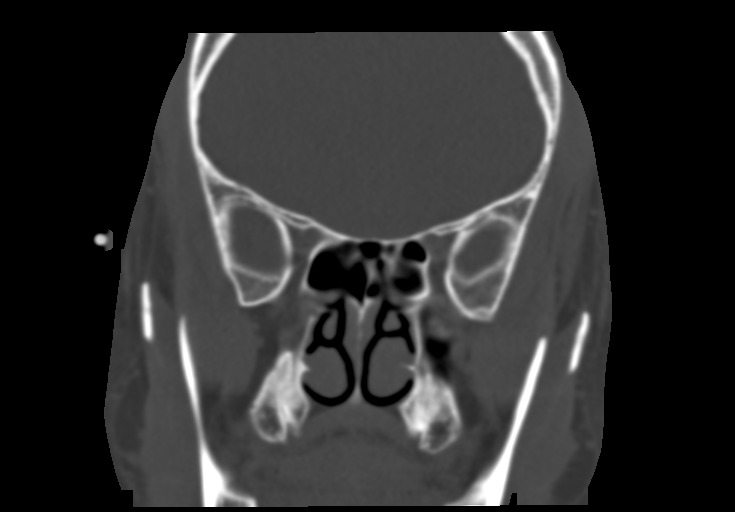

[Series 5: sagittal · sagittal · 0.27mm/px · 3 of 90 slices shown]
[im 30/90  bone]
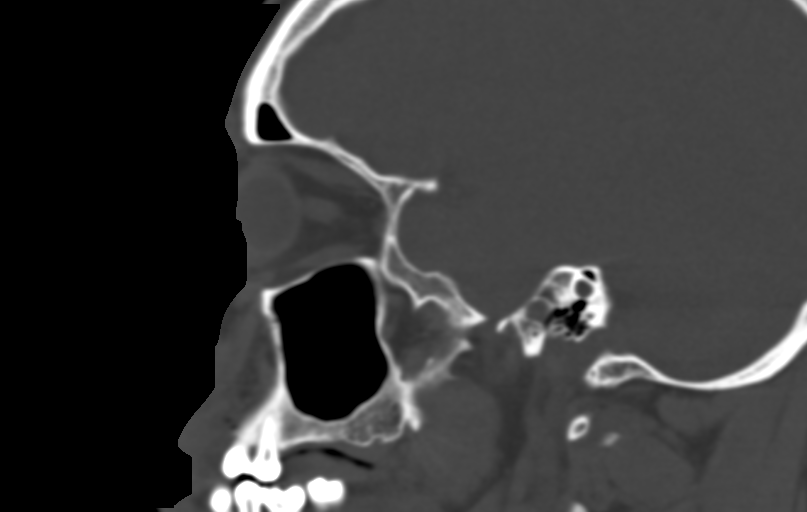
[im 45/90  bone]
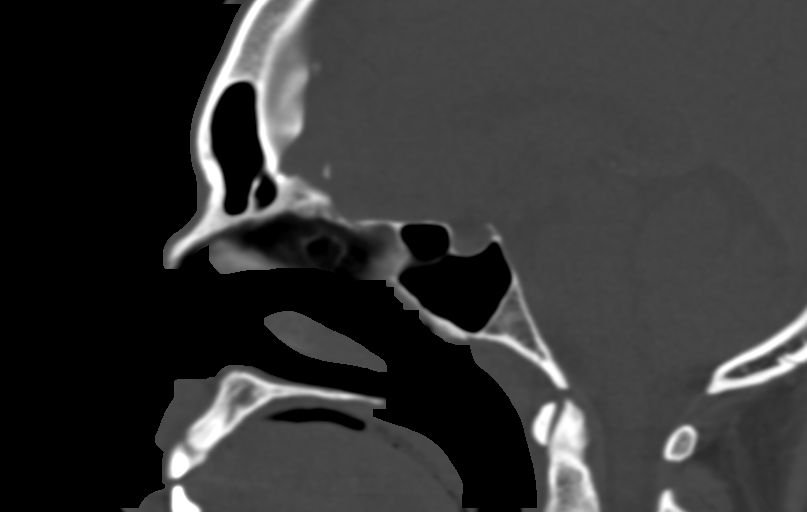
[im 60/90  bone]
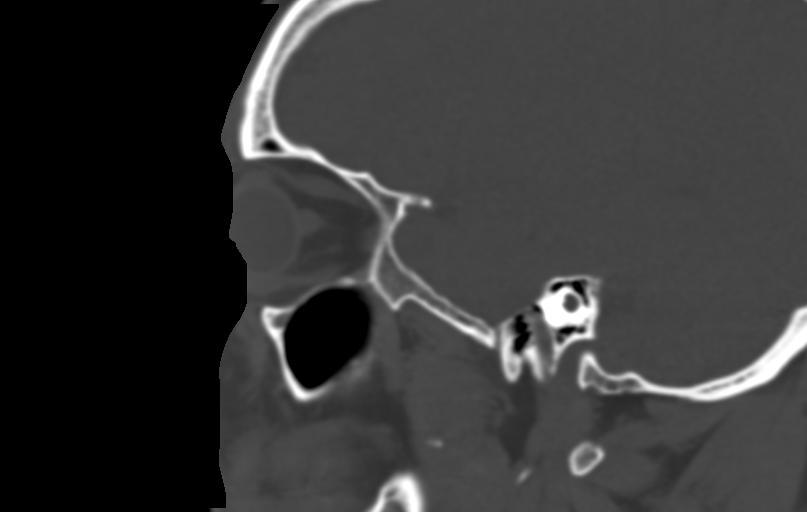

[14 of 47 positions shown; findings below may reference images not displayed]

FINDINGS: Paranasal sinuses:

Frontal: Normally aerated. Patent frontal sinus drainage pathways.

Ethmoid: Normally aerated.

Maxillary: Normally aerated. Minimal mucosal thickening along the
lateral wall of the right maxillary sinus

Sphenoid: Normally aerated. Patent sphenoethmoidal recesses.

Right ostiomeatal unit: Patent.

Left ostiomeatal unit: Patent.

Nasal passages: Patent. 4 mm rightward septal deviation.

Anatomy: Pneumatization superior to both anterior ethmoid notches.
Symmetric and intact olfactory grooves and fovea ethmoidalis, Keros
I (1-3mm). Sellar sphenoid pneumatization pattern.

Other: Orbits and intracranial compartment are unremarkable. Visible
mastoid air cells are normally aerated.
IMPRESSION: Normally aerated paranasal sinuses.  Patent sinus drainage pathways.

## 2017-10-11 ENCOUNTER — Ambulatory Visit (INDEPENDENT_AMBULATORY_CARE_PROVIDER_SITE_OTHER): Payer: BLUE CROSS/BLUE SHIELD | Admitting: Otolaryngology

## 2018-09-24 ENCOUNTER — Other Ambulatory Visit: Payer: Self-pay | Admitting: Family Medicine

## 2018-09-24 DIAGNOSIS — R221 Localized swelling, mass and lump, neck: Secondary | ICD-10-CM

## 2018-10-03 ENCOUNTER — Ambulatory Visit (HOSPITAL_COMMUNITY)
Admission: RE | Admit: 2018-10-03 | Discharge: 2018-10-03 | Disposition: A | Discharge status: Home / Self Care | Payer: BLUE CROSS/BLUE SHIELD | Source: Ambulatory Visit | Attending: Family Medicine | Admitting: Family Medicine

## 2018-10-03 ENCOUNTER — Other Ambulatory Visit: Payer: Self-pay

## 2018-10-03 DIAGNOSIS — R221 Localized swelling, mass and lump, neck: Secondary | ICD-10-CM | POA: Diagnosis not present

## 2019-03-12 ENCOUNTER — Other Ambulatory Visit: Payer: Self-pay | Admitting: Family Medicine

## 2019-03-12 ENCOUNTER — Other Ambulatory Visit (HOSPITAL_COMMUNITY): Payer: Self-pay | Admitting: Family Medicine

## 2019-03-12 DIAGNOSIS — R1909 Other intra-abdominal and pelvic swelling, mass and lump: Secondary | ICD-10-CM

## 2019-04-05 IMAGING — US SOFT TISSUE ULTRASOUND HEAD/NECK
1 series · 7 of 7 positions shown · non-contrast
Comparison: Sinus CT [DATE]

CLINICAL DATA: Left lateral neck lump posterior to the ear present
for at least a couple of months.

EXAM:
ULTRASOUND OF HEAD/NECK SOFT TISSUES
TECHNIQUE: Ultrasound examination of the head and neck soft tissues was
performed in the area of clinical concern.

[Series 1: soft tissue ultrasound head/neck · 0.05mm/px · 7 acquisitions, 7 frames shown]
[im 1/7]
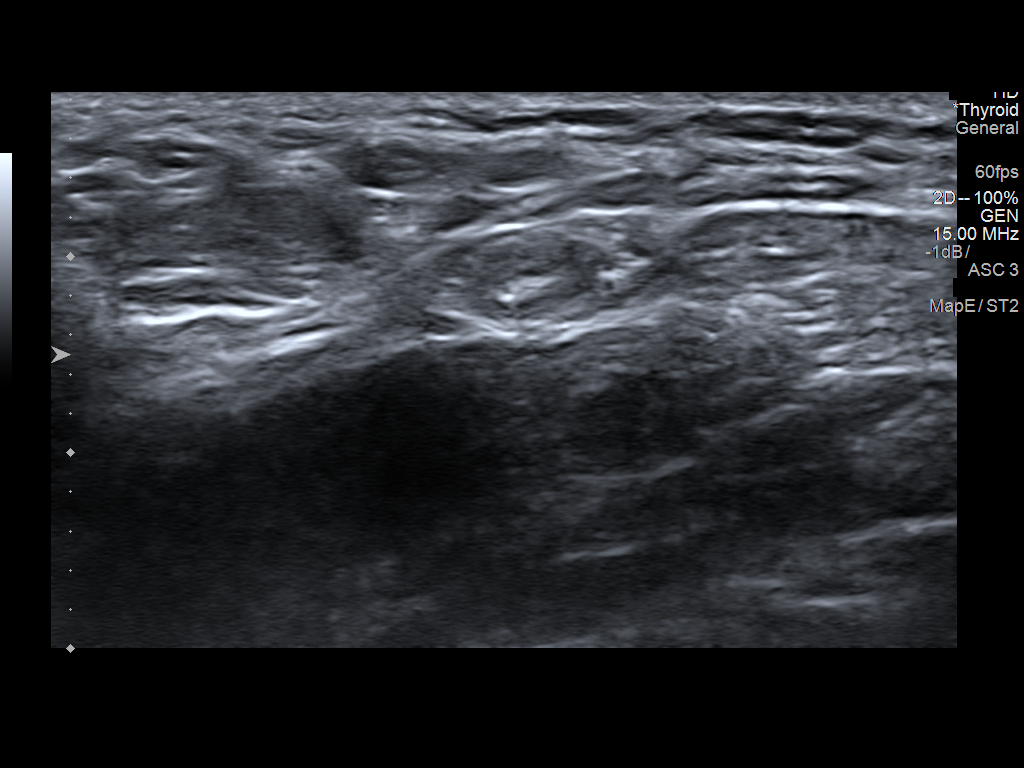
[im 2/7]
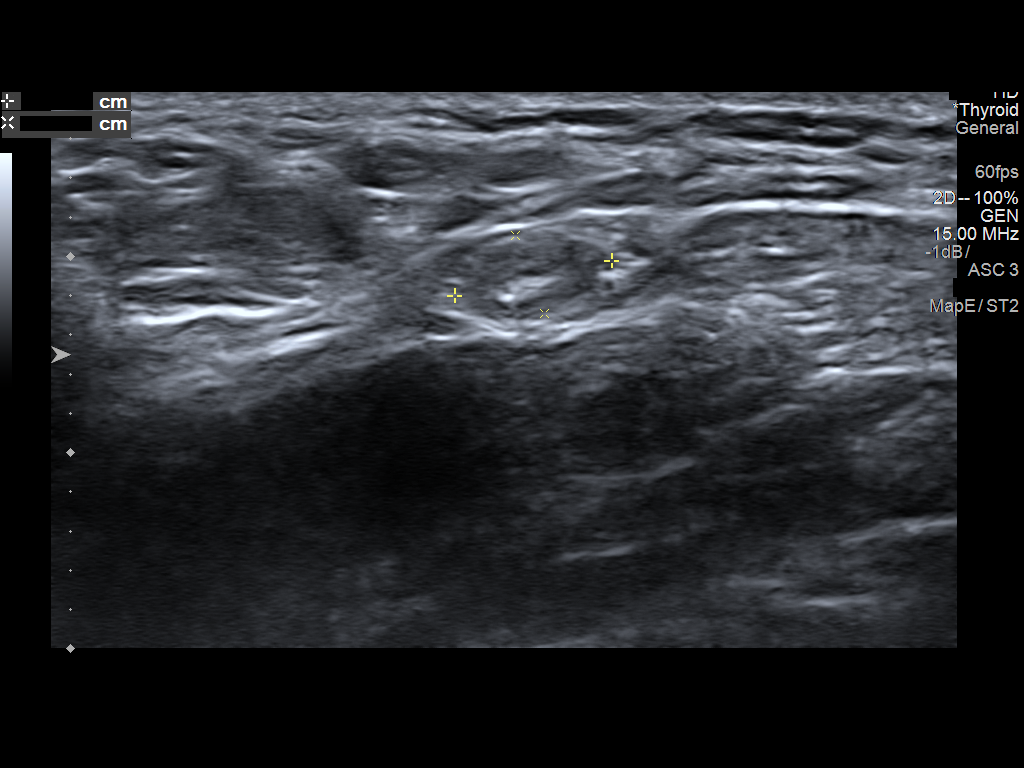
[im 3/7]
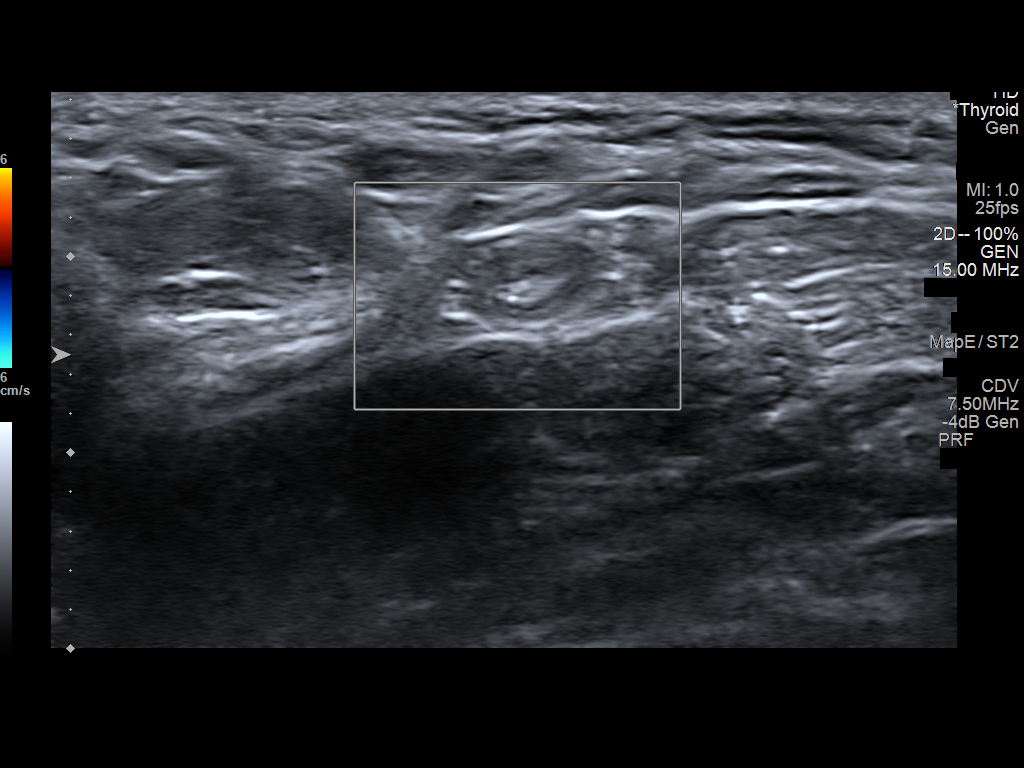
[im 4/7]
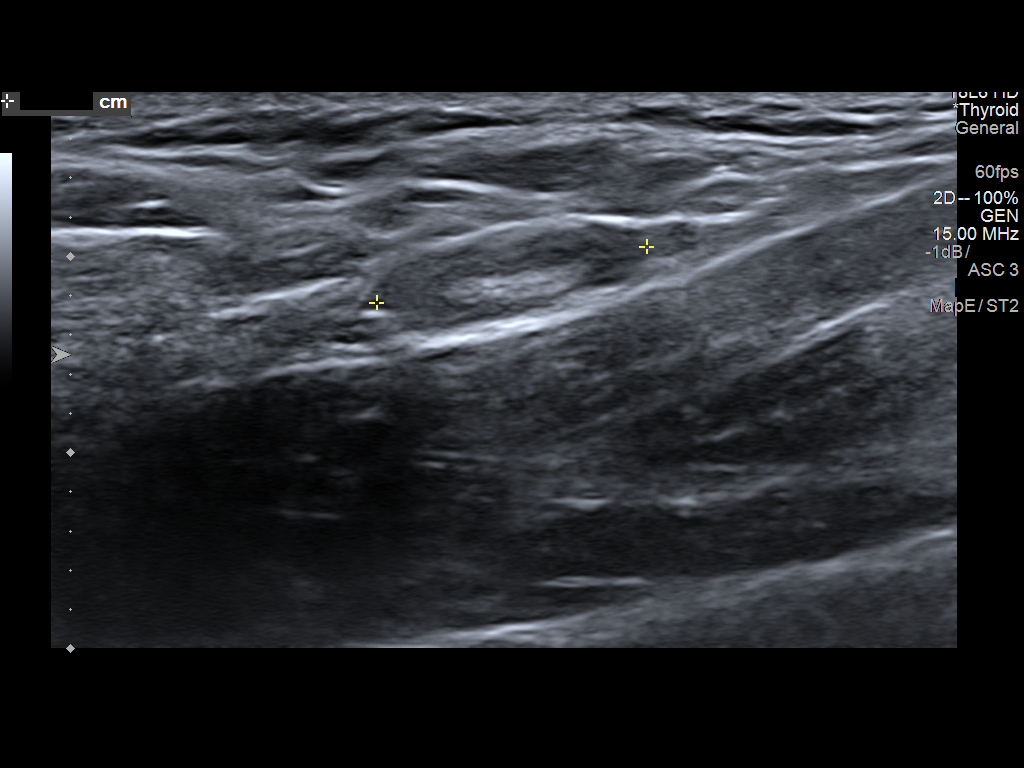
[im 5/7]
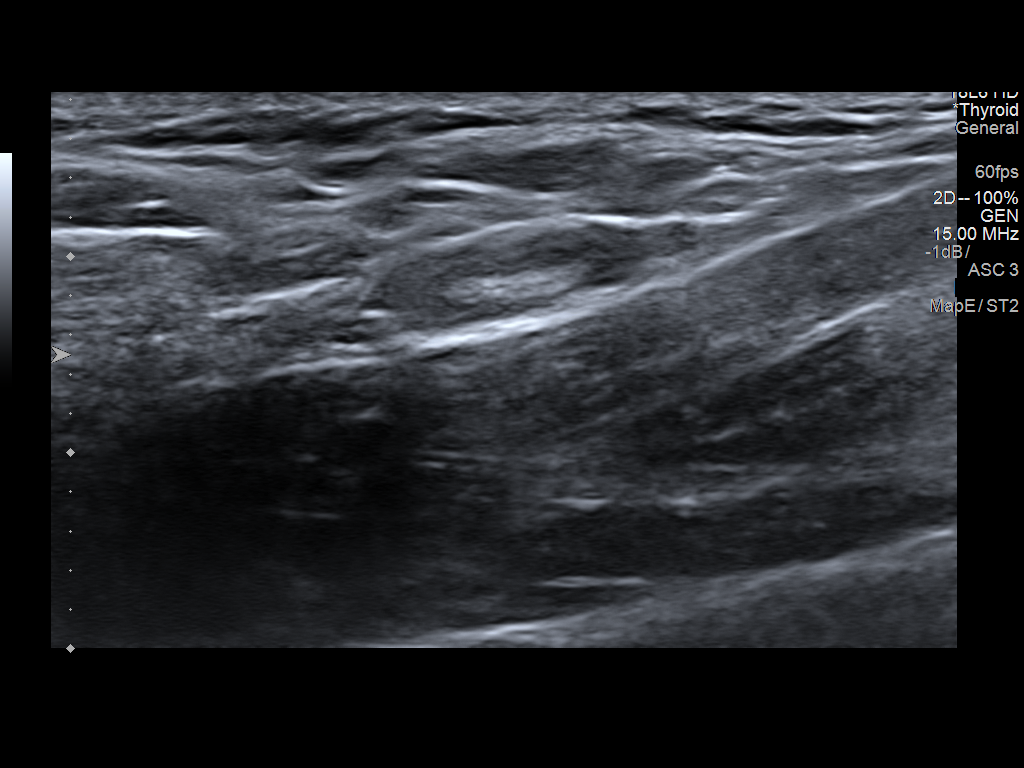
[im 6/7]
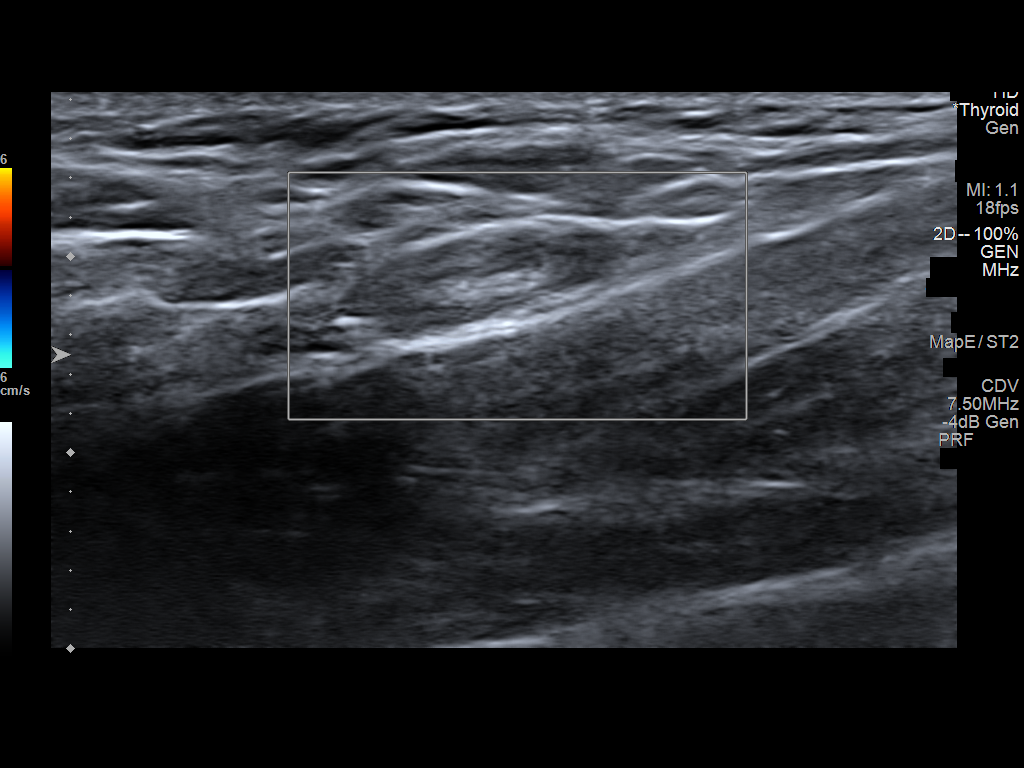
[im 7/7]
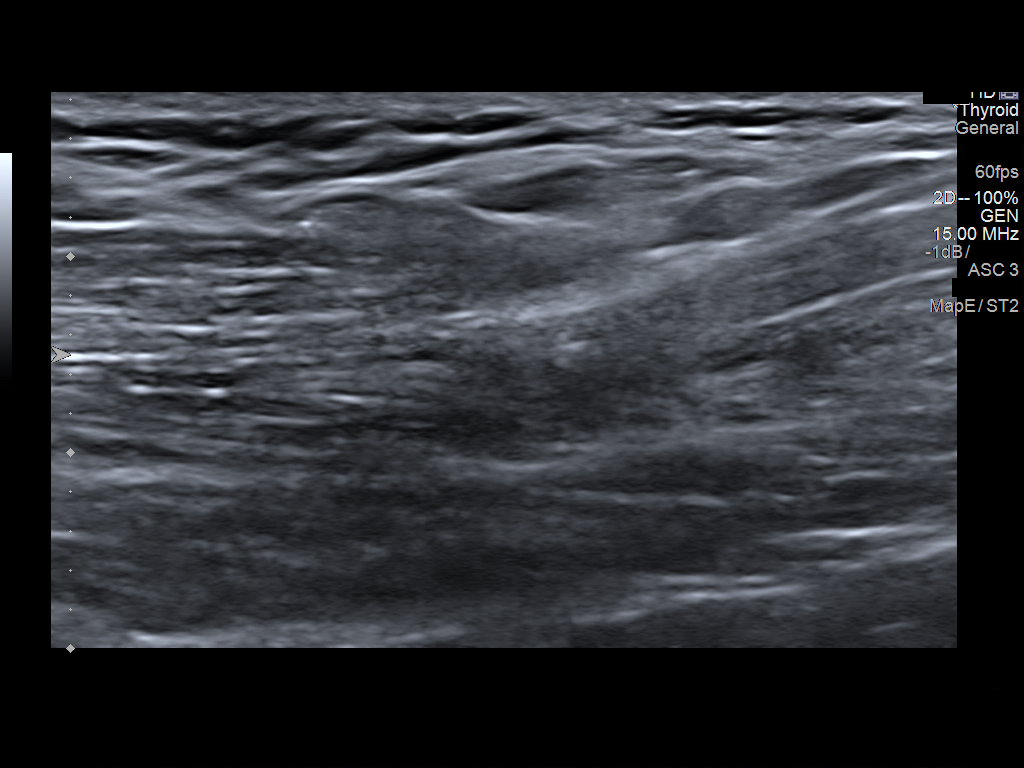

[7 of 7 positions shown; findings below may reference images not displayed]

FINDINGS: Ultrasound in the area of clinical concern demonstrates a 14 x 8 x 4
mm lymph node with normal morphology. No mass or other abnormality
was identified.
IMPRESSION: Benign-appearing lymph node in the area of clinical concern.

## 2019-06-04 ENCOUNTER — Ambulatory Visit (HOSPITAL_COMMUNITY)
Admission: RE | Admit: 2019-06-04 | Discharge: 2019-06-04 | Disposition: A | Discharge status: Home / Self Care | Payer: BC Managed Care – PPO | Source: Ambulatory Visit | Attending: Family Medicine | Admitting: Family Medicine

## 2019-06-04 ENCOUNTER — Other Ambulatory Visit: Payer: Self-pay

## 2019-06-04 DIAGNOSIS — R1909 Other intra-abdominal and pelvic swelling, mass and lump: Secondary | ICD-10-CM | POA: Diagnosis not present

## 2019-06-04 LAB — POCT I-STAT CREATININE: Creatinine, Ser: 1 mg/dL (ref 0.61–1.24)

## 2019-07-17 ENCOUNTER — Encounter: Payer: Self-pay | Admitting: Family Medicine

## 2019-07-17 ENCOUNTER — Ambulatory Visit (INDEPENDENT_AMBULATORY_CARE_PROVIDER_SITE_OTHER): Payer: 59 | Admitting: Family Medicine

## 2019-07-17 ENCOUNTER — Other Ambulatory Visit: Payer: Self-pay

## 2019-07-17 VITALS — BP 104/76 | HR 83 | Temp 98.0°F | Resp 15 | Ht 68.0 in | Wt 221.0 lb

## 2019-07-17 DIAGNOSIS — E785 Hyperlipidemia, unspecified: Secondary | ICD-10-CM | POA: Diagnosis not present

## 2019-07-17 DIAGNOSIS — R5383 Other fatigue: Secondary | ICD-10-CM | POA: Diagnosis not present

## 2019-07-17 DIAGNOSIS — E1165 Type 2 diabetes mellitus with hyperglycemia: Secondary | ICD-10-CM

## 2019-07-17 DIAGNOSIS — I10 Essential (primary) hypertension: Secondary | ICD-10-CM | POA: Diagnosis not present

## 2019-07-17 MED ORDER — TRULICITY 1.5 MG/0.5ML ~~LOC~~ SOAJ: 1.5000 mg | SUBCUTANEOUS | 1 refills | Status: DC

## 2019-07-17 NOTE — Patient Instructions (Addendum)
Happy New Year! May you have a year filled with hope, love, happiness and laughter.  I appreciate the opportunity to provide you with care for your health and wellness. Today we discussed: establish care  Follow up: 3 months   Labs within the next week  No referrals today   GOALS: Build Business  Increase water and veggie intake  And work on walking at least 5 days of week Weight lift 2-3 a week with 1 day inbetween to rest muscles Ice and use heating pad as needed for shoulder.  *you do not have to suffer, we can do our best to get you care for shoulder pain.  Please continue to practice social distancing to keep you, your family, and our community safe.  If you must go out, please wear a mask and practice good handwashing.  It was a pleasure to see you and I look forward to continuing to work together on your health and well-being. Please do not hesitate to call the office if you need care or have questions about your care.  Have a wonderful day and week. With Gratitude, Cherly Beach, DNP, AGNP-BC

## 2019-07-17 NOTE — Assessment & Plan Note (Signed)
Controlled, but continue lisinopril secondary to diabetes. Patient acknowledged agreement and understanding of the plan.

## 2019-07-17 NOTE — Assessment & Plan Note (Signed)
Deteriorated Travis Delgado is educated about the importance of exercise daily to help with weight management. A minumum of 30 minutes daily is recommended. Additionally, importance of healthy food choices  with portion control discussed.  Wt Readings from Last 3 Encounters:  07/17/19 221 lb 0.6 oz (100.3 kg)  08/17/17 214 lb (97.1 kg)  12/25/16 220 lb (99.8 kg)
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
## 2019-07-17 NOTE — Assessment & Plan Note (Signed)
Not controlled needs updated labs.  Reports that he is not compliant with his medication per him.  Would like to get better at this.  We will get updated labs and decide on medications that would be best for him to stay on or just.  Strongly encouraged to make sure that he is checking blood sugars making sure his feet are healthy.  Getting back in with an eye doctor.  Referral to ophthalmology provided today.  Encouraged to make sure he is maintaining a heart healthy, low-fat, diabetic friendly diet.  And encouraged to make sure that he gets 30 minutes of exercise on most days per week. Patient acknowledged agreement and understanding of the plan.

## 2019-07-17 NOTE — Assessment & Plan Note (Signed)
Reports that he tires out more than he usually does.  Reports that he is not outside as much as he could be.  Possible related to vitamin D deficiency will be checking vitamin D level.  Could be also related to uncontrolled diabetes.  Will be checking A1c and adjusting medications as needed. Patient acknowledged agreement and understanding of the plan.

## 2019-07-17 NOTE — Assessment & Plan Note (Signed)
Previously not well controlled.  Reports that he taking his Zetia.  Will be getting updated labs.  Advised to follow low-fat diet as discussed and encouraged.

## 2019-07-17 NOTE — Addendum Note (Signed)
Addended by: Perlie Mayo on: 07/17/2019 03:00 PM   Modules accepted: Orders

## 2019-07-29 LAB — CBC
HCT: 43.8 % (ref 38.5–50.0)
Hemoglobin: 14.3 g/dL (ref 13.2–17.1)
MCH: 26.7 pg — ABNORMAL LOW (ref 27.0–33.0)
MCHC: 32.6 g/dL (ref 32.0–36.0)
MCV: 81.7 fL (ref 80.0–100.0)
MPV: 11.6 fL (ref 7.5–12.5)
Platelets: 200 10*3/uL (ref 140–400)
RBC: 5.36 10*6/uL (ref 4.20–5.80)
RDW: 13 % (ref 11.0–15.0)
WBC: 4.8 10*3/uL (ref 3.8–10.8)

## 2019-07-29 LAB — COMPLETE METABOLIC PANEL WITH GFR
AG Ratio: 1.6 (calc) (ref 1.0–2.5)
ALT: 33 U/L (ref 9–46)
AST: 22 U/L (ref 10–40)
Albumin: 4.4 g/dL (ref 3.6–5.1)
Alkaline phosphatase (APISO): 42 U/L (ref 36–130)
BUN: 15 mg/dL (ref 7–25)
CO2: 30 mmol/L (ref 20–32)
Calcium: 9.3 mg/dL (ref 8.6–10.3)
Chloride: 100 mmol/L (ref 98–110)
Creat: 0.87 mg/dL (ref 0.60–1.35)
GFR, Est African American: 118 mL/min/{1.73_m2} (ref >=60)
GFR, Est Non African American: 102 mL/min/{1.73_m2} (ref >=60)
Globulin: 2.8 g/dL (calc) (ref 1.9–3.7)
Glucose, Bld: 363 mg/dL — ABNORMAL HIGH (ref 65–99)
Potassium: 4.5 mmol/L (ref 3.5–5.3)
Sodium: 134 mmol/L — ABNORMAL LOW (ref 135–146)
Total Bilirubin: 0.6 mg/dL (ref 0.2–1.2)
Total Protein: 7.2 g/dL (ref 6.1–8.1)

## 2019-07-29 LAB — LIPID PANEL
Cholesterol: 224 mg/dL — ABNORMAL HIGH (ref <200)
HDL: 38 mg/dL — ABNORMAL LOW (ref >=40)
LDL Cholesterol (Calc): 164 mg/dL (calc) — ABNORMAL HIGH
Non-HDL Cholesterol (Calc): 186 mg/dL (calc) — ABNORMAL HIGH (ref <130)
Total CHOL/HDL Ratio: 5.9 (calc) — ABNORMAL HIGH (ref <5.0)
Triglycerides: 107 mg/dL (ref <150)

## 2019-07-29 LAB — HEMOGLOBIN A1C
Hgb A1c MFr Bld: 10.9 % of total Hgb — ABNORMAL HIGH (ref <5.7)
Mean Plasma Glucose: 266 (calc)
eAG (mmol/L): 14.7 (calc)

## 2019-07-29 LAB — VITAMIN D 25 HYDROXY (VIT D DEFICIENCY, FRACTURES): Vit D, 25-Hydroxy: 22 ng/mL — ABNORMAL LOW (ref 30–100)

## 2019-07-30 ENCOUNTER — Telehealth: Payer: Self-pay | Admitting: *Deleted

## 2019-07-30 ENCOUNTER — Other Ambulatory Visit: Payer: Self-pay | Admitting: Family Medicine

## 2019-07-30 DIAGNOSIS — E1165 Type 2 diabetes mellitus with hyperglycemia: Secondary | ICD-10-CM

## 2019-07-30 NOTE — Telephone Encounter (Signed)
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

## 2019-07-30 NOTE — Telephone Encounter (Signed)
Spoke with patient and advised of lab results

## 2019-09-22 ENCOUNTER — Other Ambulatory Visit: Payer: Self-pay | Admitting: *Deleted

## 2019-09-22 DIAGNOSIS — E1165 Type 2 diabetes mellitus with hyperglycemia: Secondary | ICD-10-CM

## 2019-09-22 MED ORDER — TRULICITY 1.5 MG/0.5ML ~~LOC~~ SOAJ: 1.5000 mg | SUBCUTANEOUS | 1 refills | Status: DC

## 2019-09-30 ENCOUNTER — Other Ambulatory Visit: Payer: Self-pay

## 2019-09-30 ENCOUNTER — Encounter: Payer: Self-pay | Admitting: "Endocrinology

## 2019-09-30 ENCOUNTER — Ambulatory Visit (INDEPENDENT_AMBULATORY_CARE_PROVIDER_SITE_OTHER): Payer: 59 | Admitting: "Endocrinology

## 2019-09-30 VITALS — BP 111/74 | HR 66 | Ht 68.0 in | Wt 221.0 lb

## 2019-09-30 DIAGNOSIS — I10 Essential (primary) hypertension: Secondary | ICD-10-CM

## 2019-09-30 DIAGNOSIS — E1165 Type 2 diabetes mellitus with hyperglycemia: Secondary | ICD-10-CM | POA: Diagnosis not present

## 2019-09-30 DIAGNOSIS — E782 Mixed hyperlipidemia: Secondary | ICD-10-CM | POA: Diagnosis not present

## 2019-09-30 MED ORDER — GLIPIZIDE ER 10 MG PO TB24: 10.0000 mg | ORAL_TABLET | Freq: Every day | ORAL | 1 refills | Status: DC
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
## 2019-09-30 NOTE — Patient Instructions (Signed)

## 2019-10-03 ENCOUNTER — Telehealth: Payer: Self-pay

## 2019-10-03 ENCOUNTER — Other Ambulatory Visit: Payer: Self-pay | Admitting: *Deleted

## 2019-10-03 DIAGNOSIS — E1165 Type 2 diabetes mellitus with hyperglycemia: Secondary | ICD-10-CM

## 2019-10-03 MED ORDER — TRULICITY 1.5 MG/0.5ML ~~LOC~~ SOAJ: 1.5000 mg | SUBCUTANEOUS | 1 refills | Status: DC

## 2019-10-03 NOTE — Telephone Encounter (Signed)
Medication sent to [facility] other pharmacies removed

## 2019-10-03 NOTE — Telephone Encounter (Signed)
Pt needs TRULICITY 1.5 0000000 SOPN called in to [facility] on [address] is the Preferred [facility]acy --Remove the other pharmacies

## 2019-10-22 ENCOUNTER — Telehealth: Payer: 59 | Admitting: Family Medicine

## 2019-10-22 ENCOUNTER — Other Ambulatory Visit: Payer: Self-pay

## 2019-10-22 ENCOUNTER — Encounter: Payer: Self-pay | Admitting: Family Medicine

## 2019-10-23 ENCOUNTER — Ambulatory Visit: Payer: 59 | Admitting: Family Medicine

## 2019-10-30 ENCOUNTER — Other Ambulatory Visit: Payer: Self-pay

## 2019-10-30 ENCOUNTER — Encounter: Payer: Self-pay | Admitting: Family Medicine

## 2019-10-30 ENCOUNTER — Telehealth (INDEPENDENT_AMBULATORY_CARE_PROVIDER_SITE_OTHER): Payer: 59 | Admitting: Family Medicine

## 2019-10-30 DIAGNOSIS — E1165 Type 2 diabetes mellitus with hyperglycemia: Secondary | ICD-10-CM | POA: Diagnosis not present

## 2019-10-30 NOTE — Assessment & Plan Note (Signed)
Dr Dorris Fetch is closely following. Today he was encouraged to keep focus on diet and exercise. He has started to collate his blood sugars with blurred vision and overall feeling fatigue. He reports improvement and desires to keep going.
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
## 2019-10-30 NOTE — Patient Instructions (Signed)
I appreciate the opportunity to provide you with care for your health and wellness. Today we discussed: diabetes and weight management  Follow up: 4-5 months   No labs or referrals today  STAY THE COURSE! Small changes will make a big difference over time :) Don't give up on you!   Please continue to practice social distancing to keep you, your family, and our community safe.  If you must go out, please wear a mask and practice good handwashing.  It was a pleasure to see you and I look forward to continuing to work together on your health and well-being. Please do not hesitate to call the office if you need care or have questions about your care.  Have a wonderful day and week. With Gratitude, Cherly Beach, DNP, AGNP-BC

## 2019-10-30 NOTE — Assessment & Plan Note (Signed)
Travis Delgado is educated about the importance of exercise daily to help with weight management. A minumum of 30 minutes daily is recommended. Additionally, importance of healthy food choices  with portion control discussed.   Wt Readings from Last 3 Encounters:  10/30/19 219 lb (99.3 kg)  09/30/19 221 lb (100.2 kg)  07/17/19 221 lb 0.6 oz (100.3 kg)

## 2019-11-06 ENCOUNTER — Ambulatory Visit: Payer: 59 | Admitting: "Endocrinology

## 2019-11-22 ENCOUNTER — Emergency Department (HOSPITAL_COMMUNITY)
Admission: EM | Admit: 2019-11-22 | Discharge: 2019-11-22 | Disposition: A | Discharge status: Home / Self Care | Payer: 59 | Attending: Emergency Medicine | Admitting: Emergency Medicine

## 2019-11-22 ENCOUNTER — Encounter (HOSPITAL_COMMUNITY): Payer: Self-pay

## 2019-11-22 ENCOUNTER — Emergency Department (HOSPITAL_COMMUNITY): Payer: 59

## 2019-11-22 ENCOUNTER — Other Ambulatory Visit: Payer: Self-pay

## 2019-11-22 DIAGNOSIS — S61212A Laceration without foreign body of right middle finger without damage to nail, initial encounter: Secondary | ICD-10-CM

## 2019-11-22 DIAGNOSIS — S61220A Laceration with foreign body of right index finger without damage to nail, initial encounter: Secondary | ICD-10-CM | POA: Insufficient documentation

## 2019-11-22 DIAGNOSIS — Z79899 Other long term (current) drug therapy: Secondary | ICD-10-CM | POA: Diagnosis not present

## 2019-11-22 DIAGNOSIS — S61224A Laceration with foreign body of right ring finger without damage to nail, initial encounter: Secondary | ICD-10-CM | POA: Insufficient documentation

## 2019-11-22 DIAGNOSIS — E119 Type 2 diabetes mellitus without complications: Secondary | ICD-10-CM | POA: Diagnosis not present

## 2019-11-22 DIAGNOSIS — Y999 Unspecified external cause status: Secondary | ICD-10-CM | POA: Insufficient documentation

## 2019-11-22 DIAGNOSIS — Y9389 Activity, other specified: Secondary | ICD-10-CM | POA: Diagnosis not present

## 2019-11-22 DIAGNOSIS — Z7984 Long term (current) use of oral hypoglycemic drugs: Secondary | ICD-10-CM | POA: Diagnosis not present

## 2019-11-22 DIAGNOSIS — I10 Essential (primary) hypertension: Secondary | ICD-10-CM | POA: Insufficient documentation

## 2019-11-22 DIAGNOSIS — W312XXA Contact with powered woodworking and forming machines, initial encounter: Secondary | ICD-10-CM | POA: Diagnosis not present

## 2019-11-22 DIAGNOSIS — Y929 Unspecified place or not applicable: Secondary | ICD-10-CM | POA: Insufficient documentation

## 2019-11-22 DIAGNOSIS — Z23 Encounter for immunization: Secondary | ICD-10-CM | POA: Insufficient documentation

## 2019-11-22 DIAGNOSIS — Z87891 Personal history of nicotine dependence: Secondary | ICD-10-CM | POA: Diagnosis not present

## 2019-11-22 DIAGNOSIS — S61214A Laceration without foreign body of right ring finger without damage to nail, initial encounter: Secondary | ICD-10-CM

## 2019-11-22 DIAGNOSIS — S62662B Nondisplaced fracture of distal phalanx of right middle finger, initial encounter for open fracture: Secondary | ICD-10-CM | POA: Insufficient documentation

## 2019-11-22 IMAGING — DX DG HAND COMPLETE 3+V*R*
3 series · 3 of 3 positions shown · non-contrast
Comparison: None.

CLINICAL DATA: Cut last 3 right hand fingers with a circular saw.

EXAM:
RIGHT HAND - COMPLETE 3+ VIEW

[hand pa]
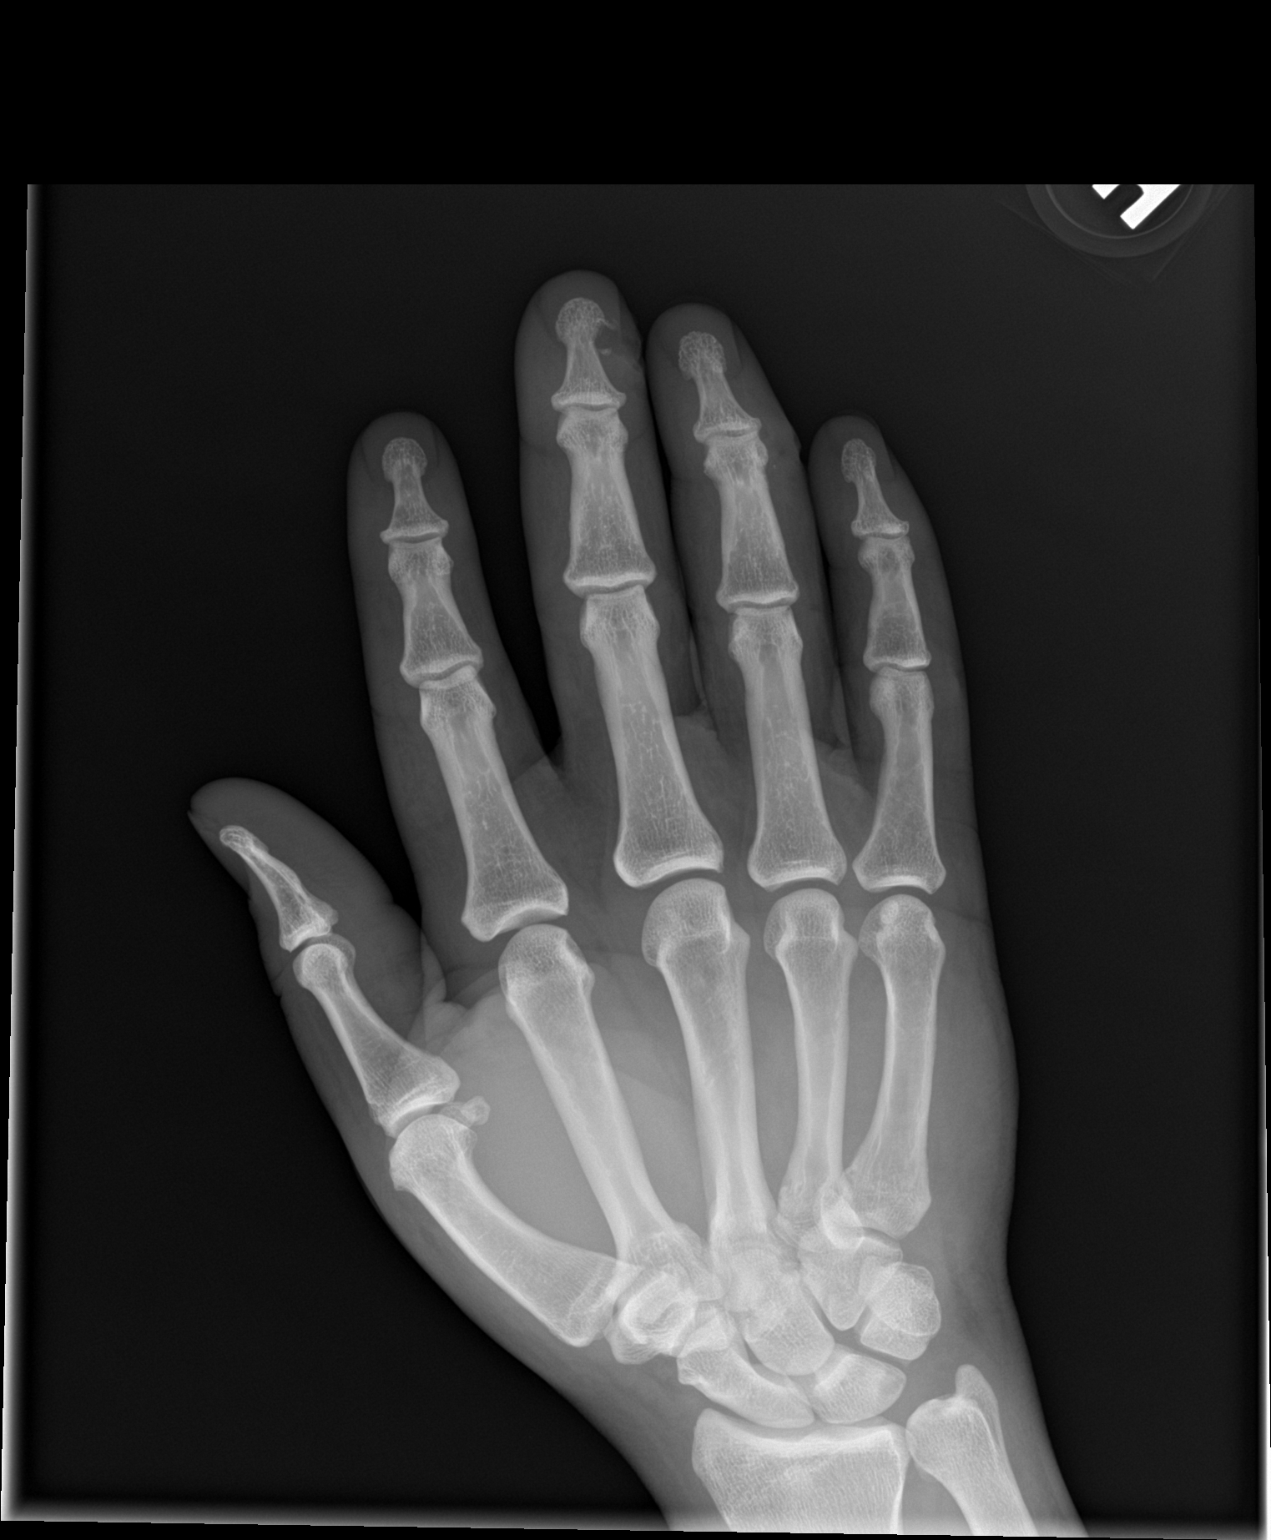

[hand obl]
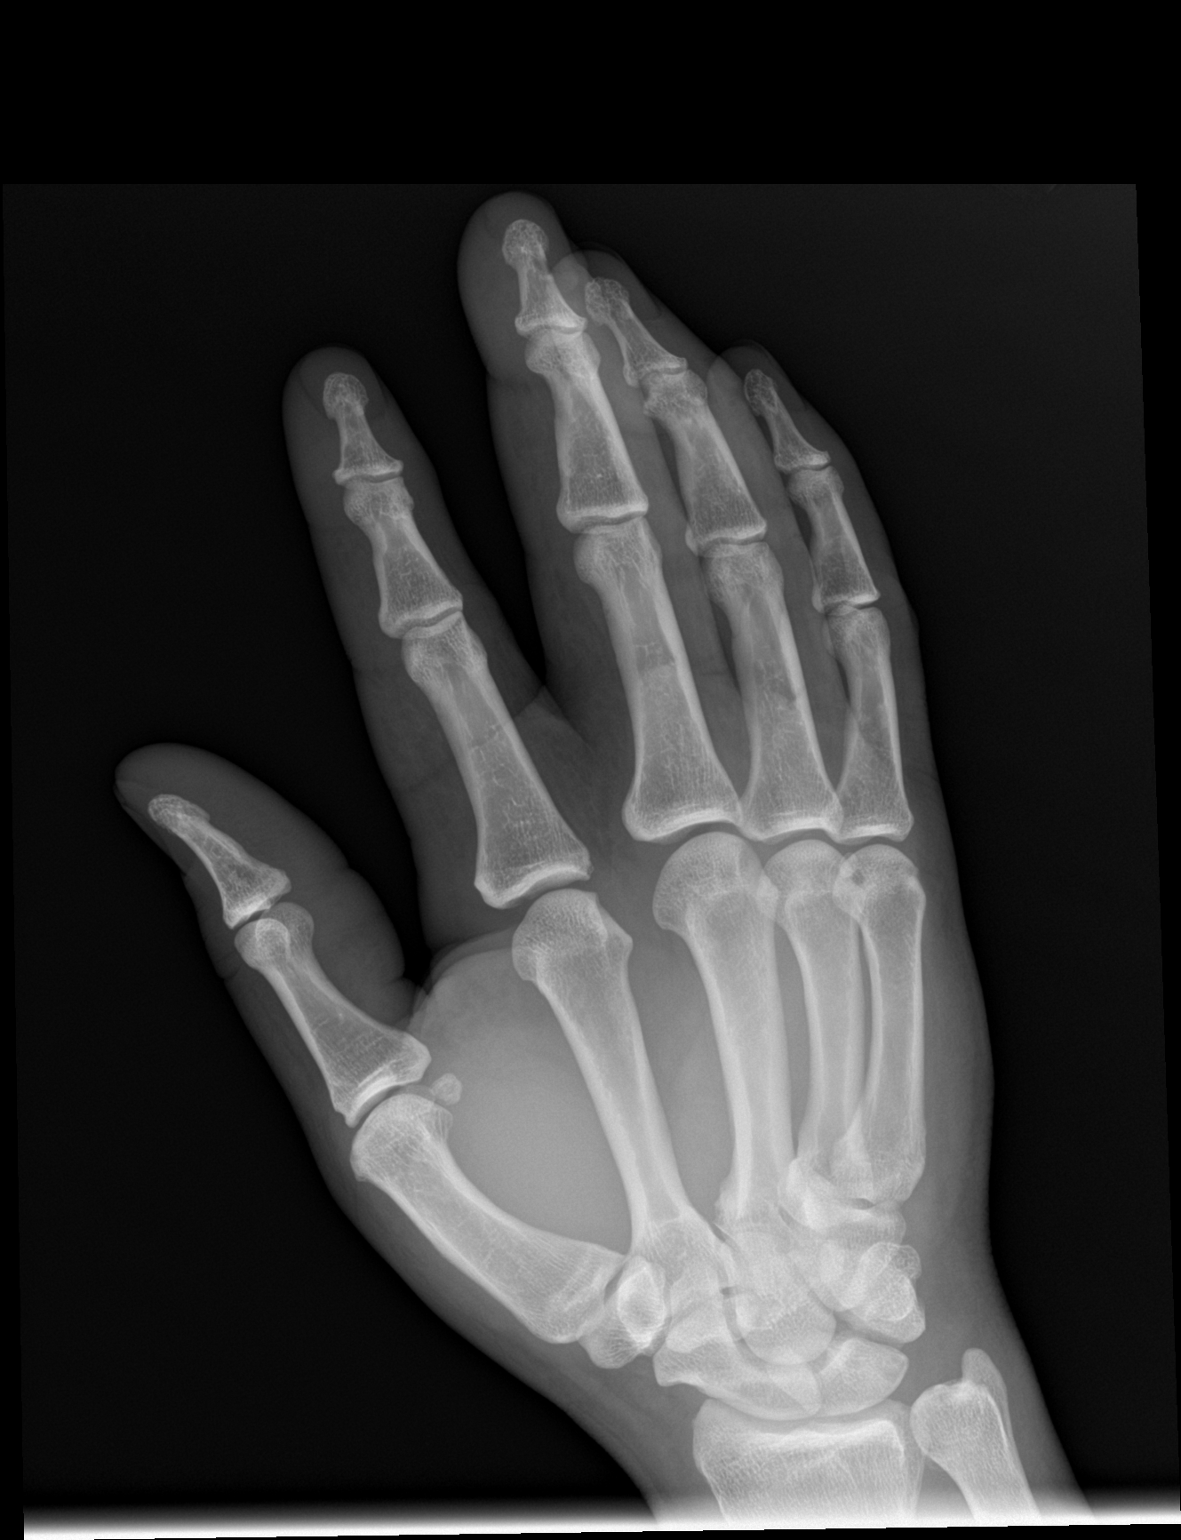

[hand lat]
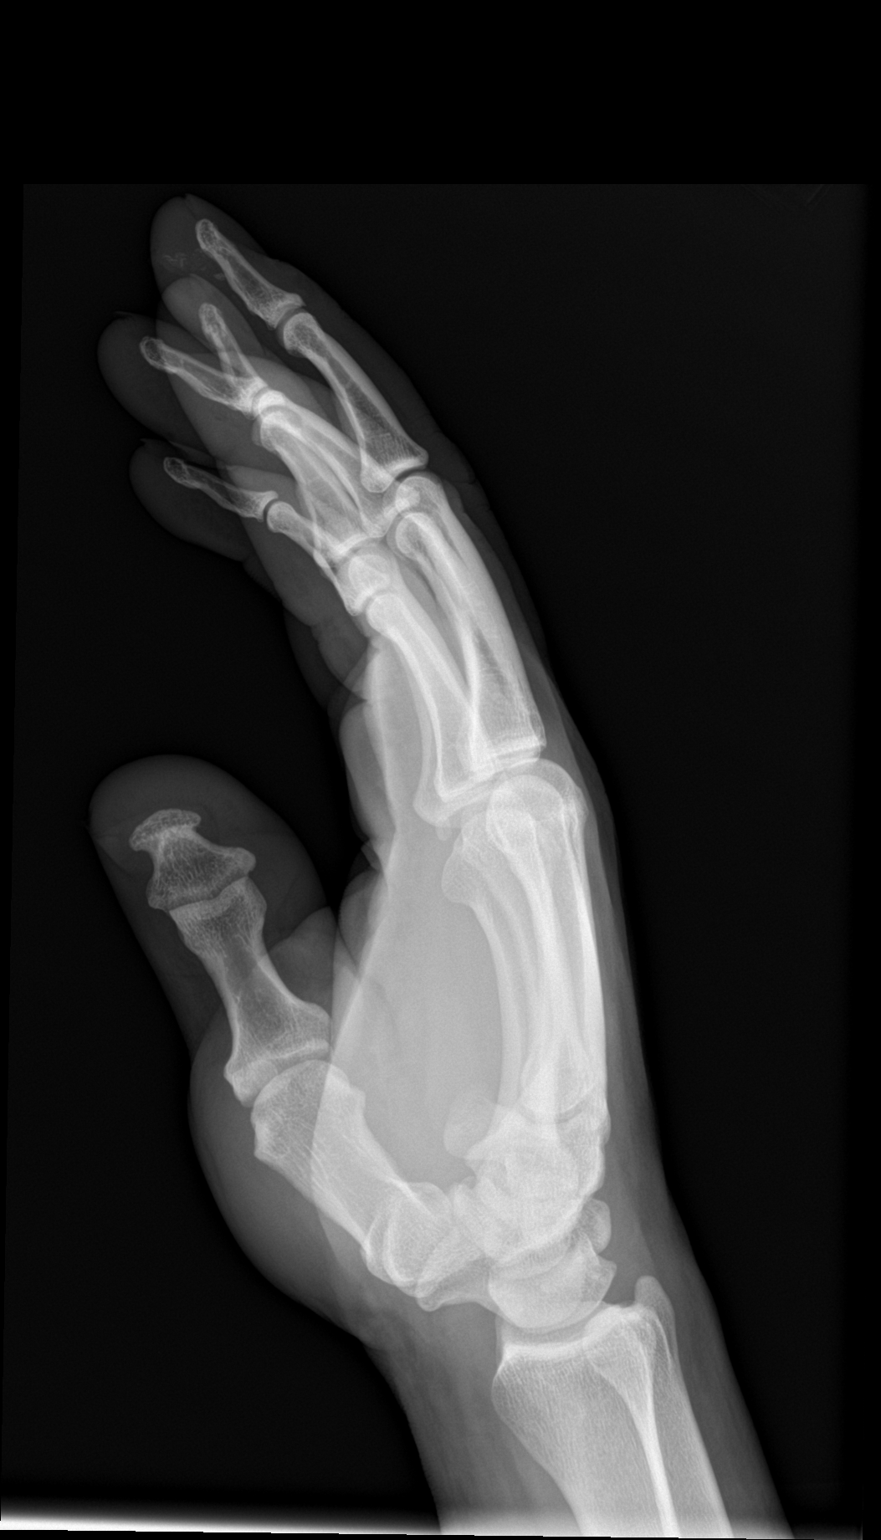

[3 of 3 positions shown; findings below may reference images not displayed]

FINDINGS: There is a soft tissue laceration along the dorsal aspect of the
distal third finger, associated with a cortical defect in the
underlying distal phalanx, at the base of the distal tuft.

No other fractures. Soft tissue injury noted across the dorsal
aspect of the fourth finger at the D IP joint.

Joints are normally spaced and aligned.

No radiopaque foreign body.
IMPRESSION: 1. Lacerations across the distal third and fourth fingers. Cortical
defect along the dorsal, ulnar margin of the distal phalanx of the
third finger with small bone fragments.
2. No dislocation.  No radiopaque foreign body.

## 2019-11-22 MED ORDER — ONDANSETRON 4 MG PO TBDP
4.0000 mg | ORAL_TABLET | Freq: Once | ORAL | Status: AC
  Administered 2019-11-22: 4 mg via ORAL
  Filled 2019-11-22: qty 1

## 2019-11-22 MED ORDER — LIDOCAINE HCL (PF) 1 % IJ SOLN
30.0000 mL | Freq: Once | INTRAMUSCULAR | Status: DC
  Filled 2019-11-22: qty 30

## 2019-11-22 MED ORDER — STERILE WATER FOR INJECTION IJ SOLN
INTRAMUSCULAR | Status: AC
  Administered 2019-11-22: 10 mL
  Filled 2019-11-22: qty 10

## 2019-11-22 MED ORDER — CEFAZOLIN SODIUM 1 G IJ SOLR
1.0000 g | Freq: Once | INTRAMUSCULAR | Status: AC
  Administered 2019-11-22: 1 g via INTRAMUSCULAR
  Filled 2019-11-22: qty 10

## 2019-11-22 MED ORDER — OXYCODONE-ACETAMINOPHEN 5-325 MG PO TABS
1.0000 | ORAL_TABLET | Freq: Once | ORAL | Status: AC
  Administered 2019-11-22: 1 via ORAL
  Filled 2019-11-22: qty 1

## 2019-11-22 MED ORDER — NAPROXEN 375 MG PO TABS
375.0000 mg | ORAL_TABLET | Freq: Two times a day (BID) | ORAL | 0 refills | Status: DC
Start: 2019-11-22 — End: 2020-03-05

## 2019-11-22 MED ORDER — TETANUS-DIPHTH-ACELL PERTUSSIS 5-2.5-18.5 LF-MCG/0.5 IM SUSP
0.5000 mL | Freq: Once | INTRAMUSCULAR | Status: AC
  Administered 2019-11-22: 0.5 mL via INTRAMUSCULAR
  Filled 2019-11-22: qty 0.5

## 2019-11-22 MED ORDER — CEPHALEXIN 500 MG PO CAPS: 500.0000 mg | ORAL_CAPSULE | Freq: Two times a day (BID) | ORAL | 0 refills | Status: AC
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
## 2019-11-22 NOTE — Discharge Instructions (Addendum)
WOUND CARE   Keep area clean and dry for 24 hours. Do not remove bandage, if applied.  After 24 hours, remove bandage and wash wound gently with mild soap and warm water. Reapply a new bandage after cleaning wound, if directed.  Continue daily cleansing with soap and water until stitches/staples are removed.  Do not apply any ointments or creams to the wound while stitches/staples are in place, as this may cause delayed healing.  Seek medical careif you experience any of the following signs of infection: Swelling, redness, pus drainage, streaking, fever >101.0 F  Seek care if you experience excessive bleeding that does not stop after 15-20 minutes of constant, firm pressure.

## 2019-11-22 NOTE — ED Triage Notes (Signed)
Pt cutting wood with circular saw and cut last 3 fingers on right hand . Occurred this morning. Bleeding controlled

## 2019-11-22 NOTE — ED Notes (Signed)
Pt gone to xray; called pt's wife to inform her to come inside to be with pt

## 2019-11-22 NOTE — ED Notes (Signed)
Patient given a basin with sterile water to soak his hand at this time.

## 2019-11-24 ENCOUNTER — Encounter: Payer: Self-pay | Admitting: Family Medicine

## 2019-12-04 ENCOUNTER — Ambulatory Visit (INDEPENDENT_AMBULATORY_CARE_PROVIDER_SITE_OTHER): Payer: 59 | Admitting: Family Medicine

## 2019-12-04 ENCOUNTER — Encounter: Payer: Self-pay | Admitting: *Deleted

## 2019-12-04 ENCOUNTER — Encounter: Payer: Self-pay | Admitting: Family Medicine

## 2019-12-04 ENCOUNTER — Other Ambulatory Visit: Payer: Self-pay

## 2019-12-04 VITALS — BP 114/78 | HR 83 | Temp 97.6°F | Ht 68.0 in | Wt 200.8 lb

## 2019-12-04 DIAGNOSIS — S62662B Nondisplaced fracture of distal phalanx of right middle finger, initial encounter for open fracture: Secondary | ICD-10-CM

## 2019-12-04 DIAGNOSIS — S61212D Laceration without foreign body of right middle finger without damage to nail, subsequent encounter: Secondary | ICD-10-CM

## 2019-12-04 DIAGNOSIS — W312XXA Contact with powered woodworking and forming machines, initial encounter: Secondary | ICD-10-CM

## 2019-12-04 DIAGNOSIS — Z4889 Encounter for other specified surgical aftercare: Secondary | ICD-10-CM

## 2019-12-04 DIAGNOSIS — S61214D Laceration without foreign body of right ring finger without damage to nail, subsequent encounter: Secondary | ICD-10-CM

## 2019-12-04 DIAGNOSIS — E1165 Type 2 diabetes mellitus with hyperglycemia: Secondary | ICD-10-CM | POA: Diagnosis not present

## 2019-12-04 DIAGNOSIS — S62662D Nondisplaced fracture of distal phalanx of right middle finger, subsequent encounter for fracture with routine healing: Secondary | ICD-10-CM | POA: Diagnosis not present

## 2019-12-04 DIAGNOSIS — Z09 Encounter for follow-up examination after completed treatment for conditions other than malignant neoplasm: Secondary | ICD-10-CM

## 2019-12-04 DIAGNOSIS — S61212A Laceration without foreign body of right middle finger without damage to nail, initial encounter: Secondary | ICD-10-CM | POA: Insufficient documentation

## 2019-12-04 DIAGNOSIS — S61214A Laceration without foreign body of right ring finger without damage to nail, initial encounter: Secondary | ICD-10-CM

## 2019-12-04 DIAGNOSIS — I10 Essential (primary) hypertension: Secondary | ICD-10-CM

## 2019-12-04 HISTORY — DX: Laceration without foreign body of right middle finger without damage to nail, initial encounter: S61.212A

## 2019-12-04 HISTORY — DX: Laceration without foreign body of right ring finger without damage to nail, initial encounter: S61.214A

## 2019-12-04 HISTORY — DX: Contact with powered woodworking and forming machines, initial encounter: W31.2XXA

## 2019-12-04 HISTORY — DX: Nondisplaced fracture of distal phalanx of right middle finger, initial encounter for open fracture: S62.662B

## 2019-12-04 LAB — POCT GLYCOSYLATED HEMOGLOBIN (HGB A1C)
HbA1c POC (<> result, manual entry): 11.1 % (ref 4.0–5.6)
HbA1c, POC (controlled diabetic range): 11.1 % — AB (ref 0.0–7.0)
HbA1c, POC (prediabetic range): 11.1 % — AB (ref 5.7–6.4)
Hemoglobin A1C: 11.1 % — AB (ref 4.0–5.6)

## 2019-12-04 MED ORDER — LISINOPRIL 5 MG PO TABS: 5.0000 mg | ORAL_TABLET | Freq: Every day | ORAL | 1 refills | Status: DC

## 2019-12-04 MED ORDER — GLIPIZIDE 10 MG PO TABS: 10.0000 mg | ORAL_TABLET | Freq: Two times a day (BID) | ORAL | 0 refills | Status: DC

## 2019-12-04 MED ORDER — IBUPROFEN 800 MG PO TABS: 800.0000 mg | ORAL_TABLET | Freq: Three times a day (TID) | ORAL | 0 refills | Status: DC | PRN

## 2019-12-04 MED ORDER — TRULICITY 1.5 MG/0.5ML ~~LOC~~ SOAJ: 1.5000 mg | SUBCUTANEOUS | 3 refills | Status: DC
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
## 2019-12-04 NOTE — Patient Instructions (Addendum)
I appreciate the opportunity to provide you with care for your health and wellness. Today we discussed: injury to hand and diabetes  Follow up: 3 months for A1c in office and DM follow up  A1c in office today: 11.1% I have adjust your medications, please review med list behind this sheet.  Work note today from May 15 until June 7.  No labs or referrals today  Ibuprofen for pain and inflammation of fingers and right hand. Continue moving fingers as you can to keep them healthy and mobile.  So glad this was not worse for you.   Please call if you have trouble, increased pain, drainage, pus, warmth or redness at the injury sites.   Please continue to practice social distancing to keep you, your family, and our community safe.  If you must go out, please wear a mask and practice good handwashing.  It was a pleasure to see you and I look forward to continuing to work together on your health and well-being. Please do not hesitate to call the office if you need care or have questions about your care.  Have a wonderful day and week. With Gratitude, Cherly Beach, DNP, AGNP-BC

## 2019-12-05 ENCOUNTER — Other Ambulatory Visit (HOSPITAL_COMMUNITY)
Admission: RE | Admit: 2019-12-05 | Discharge: 2019-12-05 | Disposition: A | Discharge status: Home / Self Care | Payer: 59 | Source: Other Acute Inpatient Hospital | Attending: Family Medicine | Admitting: Family Medicine

## 2019-12-05 ENCOUNTER — Encounter: Payer: Self-pay | Admitting: Family Medicine

## 2019-12-05 DIAGNOSIS — E1165 Type 2 diabetes mellitus with hyperglycemia: Secondary | ICD-10-CM | POA: Insufficient documentation

## 2019-12-05 DIAGNOSIS — Z13228 Encounter for screening for other metabolic disorders: Secondary | ICD-10-CM | POA: Insufficient documentation

## 2019-12-05 NOTE — Assessment & Plan Note (Signed)
Review of hospital note, testing, imaging, prescription medications and treatments performed during visit.

## 2019-12-05 NOTE — Assessment & Plan Note (Signed)
Dressing was clean cleaned and rewrapped by myself today in the office. Continued wound treatment as ordered with antibacterial soap washes and dressing changes and open to air at night.

## 2019-12-05 NOTE — Assessment & Plan Note (Signed)
Education on use of power tools, cords, safety equipment such as gloves and goggles.

## 2019-12-05 NOTE — Assessment & Plan Note (Signed)
Controlled continue use of lisinopril.  DASH diet and exercise recommended.

## 2019-12-05 NOTE — Assessment & Plan Note (Signed)
Dr. Dorris Fetch is following closely.  However he reports today that he has not been taking his medications as directed.  A1c is increased.  I have adjusted his medication some today in the office including refilling his Trulicity, glipizide to 10 mg twice daily.  And advised for him to take his medications as directed and take his blood sugars as directed.  And to start to take better care of his blood sugar overall.  Never went to the ophthalmologist as previously ordered.  I have reordered this and asked the front desk to help set up an appointment.

## 2019-12-05 NOTE — Assessment & Plan Note (Signed)
No bracing needed no casting needed.  Continued wound treatment as ordered with antibacterial soap washes and dressing changes and open to air at night.

## 2019-12-06 LAB — MICROALBUMIN, URINE: Microalb, Ur: 3.6 ug/mL — ABNORMAL HIGH

## 2019-12-09 ENCOUNTER — Other Ambulatory Visit: Payer: Self-pay | Admitting: *Deleted

## 2019-12-09 MED ORDER — EZETIMIBE 10 MG PO TABS: 10.0000 mg | ORAL_TABLET | Freq: Every day | ORAL | 0 refills | Status: DC

## 2019-12-25 ENCOUNTER — Encounter: Payer: Self-pay | Admitting: Family Medicine

## 2019-12-30 ENCOUNTER — Other Ambulatory Visit: Payer: Self-pay

## 2019-12-30 ENCOUNTER — Encounter: Payer: 59 | Attending: "Endocrinology | Admitting: Nutrition

## 2019-12-30 ENCOUNTER — Encounter: Payer: Self-pay | Admitting: Nutrition

## 2019-12-30 DIAGNOSIS — E782 Mixed hyperlipidemia: Secondary | ICD-10-CM | POA: Diagnosis present

## 2019-12-30 DIAGNOSIS — I10 Essential (primary) hypertension: Secondary | ICD-10-CM | POA: Diagnosis present

## 2019-12-30 DIAGNOSIS — E1165 Type 2 diabetes mellitus with hyperglycemia: Secondary | ICD-10-CM | POA: Insufficient documentation

## 2019-12-30 NOTE — Patient Instructions (Signed)
Goals  Follow MY Plate Drink only water-gallon a day Plan meals Eat meals on time Take lunch to work Avoid snacks. Testing blood sugars twice a day. Get A1C down to 7%

## 2019-12-30 NOTE — Progress Notes (Signed)
  Medical Nutrition Therapy:  Appt start time: 0830 end time:  0930.  Assessment:  Primary concerns today: Diabetes Type 2.  Dm x 5+ years. See Dr. Dorris Fetch, Endocrinology. Strong family history of DM. He cooks and shops mostly. LIves with wife and kids. Eats 2-3 meals per day. Snacks some between meals..  Was drinking a lot of juice type liquid and thinks that made his BS go up. FBS  Hasn't been testing blood sugars this last week due to cutting his fingers a week or two ago. Cut his right  3/4rd fingers on a skill saw a few weeks ago. Frustrated with the healing process and numbness in finger tips.   Glipizide 10 mg daily and Trulicity.  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 Highly motivated to make needed changes in diet and exercise, test blood sugars on regular basis and not skip meals.  Lab Results  Component Value Date   HGBA1C 11.1 (A) 12/04/2019   HGBA1C 11.1 12/04/2019   HGBA1C 11.1 (A) 12/04/2019   HGBA1C 11.1 (A) 12/04/2019   CMP Latest Ref Rng & Units 07/28/2019 06/04/2019 02/23/2016  Glucose 65 - 99 mg/dL 363(H) - 201(H)  BUN 7 - 25 mg/dL 15 - 17  Creatinine 0.60 - 1.35 mg/dL 0.87 1.00 0.91  Sodium 135 - 146 mmol/L 134(L) - 133(L)  Potassium 3.5 - 5.3 mmol/L 4.5 - 3.5  Chloride 98 - 110 mmol/L 100 - 103  CO2 20 - 32 mmol/L 30 - 23  Calcium 8.6 - 10.3 mg/dL 9.3 - 8.9  Total Protein 6.1 - 8.1 g/dL 7.2 - -  Total Bilirubin 0.2 - 1.2 mg/dL 0.6 - -  Alkaline Phos 39 - 117 U/L - - -  AST 10 - 40 U/L 22 - -  ALT 9 - 46 U/L 33 - -     Preferred Learning Style:   No preference indicated   Learning Readiness:   Ready  Change in progress   MEDICATIONS:   DIETARY INTAKE:    24-hr recall:   Eats 2-3 meals per day. Skips lunch at times o eats  Beverages:   Usual physical activity:   Estimated energy needs: 2000-2200 kcal 225 g CHO 150 g Pro 56 g Fat.   Progress Towards Goal(s):  In progress.   Nutritional Diagnosis:   NB-1.1 Food and nutrition-related knowledge deficit As related to Diabetes Type 2.  As evidenced by A1C 11%.    Intervention:  Nutrition and Diabetes education provided on My Plate, CHO counting, meal planning, portion sizes, timing of meals, avoiding snacks between meals unless having a low blood sugar, target ranges for A1C and blood sugars, signs/symptoms and treatment of hyper/hypoglycemia, monitoring blood sugars, taking medications as prescribed, benefits of exercising 30 minutes per day and prevention of complications of DM.   Goals  Follow MY Plate Drink only water-gallon a day Plan meals Eat meals on time Take lunch to work Avoid snacks. Testing blood sugars twice a day. Get A1C down to 7%  Teaching Method Utilized:  Visual Auditory Hands on  Handouts given during visit include:  The Plate Method   Meal Plan Card  Diabetes Instructions.   Barriers to learning/adherence to lifestyle change: none  Demonstrated degree of understanding via:  Teach Back   Monitoring/Evaluation:  Dietary intake, exercise, , and body weight in 1 month(s).

## 2020-02-05 ENCOUNTER — Ambulatory Visit: Payer: 59 | Admitting: Nutrition

## 2020-02-05 ENCOUNTER — Ambulatory Visit: Payer: 59 | Admitting: "Endocrinology

## 2020-03-05 ENCOUNTER — Other Ambulatory Visit: Payer: Self-pay

## 2020-03-05 ENCOUNTER — Ambulatory Visit (INDEPENDENT_AMBULATORY_CARE_PROVIDER_SITE_OTHER): Payer: 59 | Admitting: Family Medicine

## 2020-03-05 ENCOUNTER — Encounter: Payer: Self-pay | Admitting: Family Medicine

## 2020-03-05 ENCOUNTER — Ambulatory Visit: Payer: 59

## 2020-03-05 VITALS — BP 118/82 | HR 83 | Temp 97.2°F | Resp 18 | Ht 68.0 in | Wt 215.4 lb

## 2020-03-05 DIAGNOSIS — E1165 Type 2 diabetes mellitus with hyperglycemia: Secondary | ICD-10-CM | POA: Diagnosis not present

## 2020-03-05 DIAGNOSIS — S30861A Insect bite (nonvenomous) of abdominal wall, initial encounter: Secondary | ICD-10-CM

## 2020-03-05 DIAGNOSIS — W57XXXA Bitten or stung by nonvenomous insect and other nonvenomous arthropods, initial encounter: Secondary | ICD-10-CM

## 2020-03-05 DIAGNOSIS — S30860A Insect bite (nonvenomous) of lower back and pelvis, initial encounter: Secondary | ICD-10-CM | POA: Insufficient documentation

## 2020-03-05 LAB — POCT GLYCOSYLATED HEMOGLOBIN (HGB A1C)
HbA1c POC (<> result, manual entry): 10 % (ref 4.0–5.6)
HbA1c, POC (controlled diabetic range): 10 % — AB (ref 0.0–7.0)
HbA1c, POC (prediabetic range): 10 % — AB (ref 5.7–6.4)
Hemoglobin A1C: 10 % — AB (ref 4.0–5.6)

## 2020-03-05 MED ORDER — DOXYCYCLINE HYCLATE 100 MG PO CAPS: 200.0000 mg | ORAL_CAPSULE | Freq: Once | ORAL | 0 refills | Status: AC

## 2020-03-05 NOTE — Assessment & Plan Note (Signed)
Tick bite last night, has had several ticks pulled off of him as of last night.  Reports that he was mowing the grass a few days back and outside frequently over the last week.  He is not specific as to what day he was out there most frequently.  But did note that he pulled off several ticks last night.  He denies having any bull's-eye signs.  There are no noted bull's-eye in physical exam. He does report feeling physically tired.  However I checked his A1c and is 10%.  So unsure if that is related to diabetes versus the tick bites.  Prophylactic to 200 mg doxycycline provided.  If not feeling better within the week we will consider lab work.
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
## 2020-03-05 NOTE — Patient Instructions (Addendum)
I appreciate the opportunity to provide you with care for your health and wellness. Today we discussed: Diabetes and tick bites   Follow up: Jan for annual appt-morning for fasting labs that day  No labs or referrals today   Please please work on taking your meds on a daily basis to help get your A1c down.  Protect yourself when you go outside from ticks, just your pets for ticks.  Thank you for getting covid vaccine :)  Please continue to practice social distancing to keep you, your family, and our community safe.  If you must go out, please wear a mask and practice good handwashing.  It was a pleasure to see you and I look forward to continuing to work together on your health and well-being. Please do not hesitate to call the office if you need care or have questions about your care.  Have a wonderful day and week. With Gratitude, Cherly Beach, DNP, AGNP-BC

## 2020-03-05 NOTE — Assessment & Plan Note (Addendum)
A1c in the office has improved to 10%.   He is on his and he reports that he has not been taking his medications as directed.  He will do it for a while and then has slacked off.  He reports that they can make him feel sick and uncomfortable.  Is followed by Dr. Dorris Fetch and nutritionist in his office.  I have encouraged him to make sure he takes his medications regularly.  And that the side effects of not feeling well from them will subside if he takes them on a consistent basis.  Currently is not on a statin however is taking Zetia.

## 2020-03-09 ENCOUNTER — Other Ambulatory Visit: Payer: Self-pay

## 2020-03-09 DIAGNOSIS — E1165 Type 2 diabetes mellitus with hyperglycemia: Secondary | ICD-10-CM

## 2020-03-09 MED ORDER — GLIPIZIDE 10 MG PO TABS: 10.0000 mg | ORAL_TABLET | Freq: Two times a day (BID) | ORAL | 0 refills | Status: DC

## 2020-03-10 ENCOUNTER — Ambulatory Visit: Payer: 59 | Admitting: Family Medicine

## 2020-04-12 ENCOUNTER — Emergency Department (HOSPITAL_COMMUNITY)
Admission: EM | Admit: 2020-04-12 | Discharge: 2020-04-12 | Disposition: A | Discharge status: Home / Self Care | Payer: 59 | Attending: Emergency Medicine | Admitting: Emergency Medicine

## 2020-04-12 ENCOUNTER — Encounter (HOSPITAL_COMMUNITY): Payer: Self-pay | Admitting: Emergency Medicine

## 2020-04-12 ENCOUNTER — Emergency Department (HOSPITAL_COMMUNITY): Payer: 59

## 2020-04-12 ENCOUNTER — Ambulatory Visit: Payer: 59 | Admitting: Internal Medicine

## 2020-04-12 ENCOUNTER — Other Ambulatory Visit: Payer: Self-pay | Admitting: *Deleted

## 2020-04-12 ENCOUNTER — Other Ambulatory Visit: Payer: Self-pay

## 2020-04-12 DIAGNOSIS — E1165 Type 2 diabetes mellitus with hyperglycemia: Secondary | ICD-10-CM | POA: Diagnosis not present

## 2020-04-12 DIAGNOSIS — Z87891 Personal history of nicotine dependence: Secondary | ICD-10-CM | POA: Insufficient documentation

## 2020-04-12 DIAGNOSIS — R739 Hyperglycemia, unspecified: Secondary | ICD-10-CM

## 2020-04-12 DIAGNOSIS — Z79899 Other long term (current) drug therapy: Secondary | ICD-10-CM | POA: Diagnosis not present

## 2020-04-12 DIAGNOSIS — I10 Essential (primary) hypertension: Secondary | ICD-10-CM | POA: Insufficient documentation

## 2020-04-12 DIAGNOSIS — M791 Myalgia, unspecified site: Secondary | ICD-10-CM | POA: Diagnosis not present

## 2020-04-12 DIAGNOSIS — Z7984 Long term (current) use of oral hypoglycemic drugs: Secondary | ICD-10-CM | POA: Insufficient documentation

## 2020-04-12 DIAGNOSIS — M2550 Pain in unspecified joint: Secondary | ICD-10-CM | POA: Diagnosis present

## 2020-04-12 LAB — CBC
HCT: 47.6 % (ref 39.0–52.0)
Hemoglobin: 15.2 g/dL (ref 13.0–17.0)
MCH: 26.3 pg (ref 26.0–34.0)
MCHC: 31.9 g/dL (ref 30.0–36.0)
MCV: 82.5 fL (ref 80.0–100.0)
Platelets: 185 10*3/uL (ref 150–400)
RBC: 5.77 MIL/uL (ref 4.22–5.81)
RDW: 13.7 % (ref 11.5–15.5)
WBC: 3.1 10*3/uL — ABNORMAL LOW (ref 4.0–10.5)
nRBC: 0 % (ref 0.0–0.2)

## 2020-04-12 LAB — URINALYSIS, ROUTINE W REFLEX MICROSCOPIC
Bacteria, UA: NONE SEEN
Bilirubin Urine: NEGATIVE
Glucose, UA: >=500 mg/dL — AB
Hgb urine dipstick: NEGATIVE
Ketones, ur: 80 mg/dL — AB
Leukocytes,Ua: NEGATIVE
Nitrite: NEGATIVE
Protein, ur: 100 mg/dL — AB
Specific Gravity, Urine: 1.037 — ABNORMAL HIGH (ref 1.005–1.030)
pH: 5 (ref 5.0–8.0)

## 2020-04-12 LAB — BASIC METABOLIC PANEL
Anion gap: 11 (ref 5–15)
BUN: 17 mg/dL (ref 6–20)
CO2: 25 mmol/L (ref 22–32)
Calcium: 9.1 mg/dL (ref 8.9–10.3)
Chloride: 95 mmol/L — ABNORMAL LOW (ref 98–111)
Creatinine, Ser: 1.11 mg/dL (ref 0.61–1.24)
GFR calc Af Amer: >60 mL/min (ref >60)
GFR calc non Af Amer: >60 mL/min (ref >60)
Glucose, Bld: 303 mg/dL — ABNORMAL HIGH (ref 70–99)
Potassium: 4.3 mmol/L (ref 3.5–5.1)
Sodium: 131 mmol/L — ABNORMAL LOW (ref 135–145)

## 2020-04-12 LAB — CBG MONITORING, ED
Glucose-Capillary: 282 mg/dL — ABNORMAL HIGH (ref 70–99)
Glucose-Capillary: 265 mg/dL — ABNORMAL HIGH (ref 70–99)

## 2020-04-12 IMAGING — DX DG CHEST 1V PORT
1 series · 1 of 1 positions shown · non-contrast
Comparison: Prior radiograph from [DATE].

CLINICAL DATA: Initial evaluation for acute cough.

EXAM:
PORTABLE CHEST 1 VIEW

[chest ap]
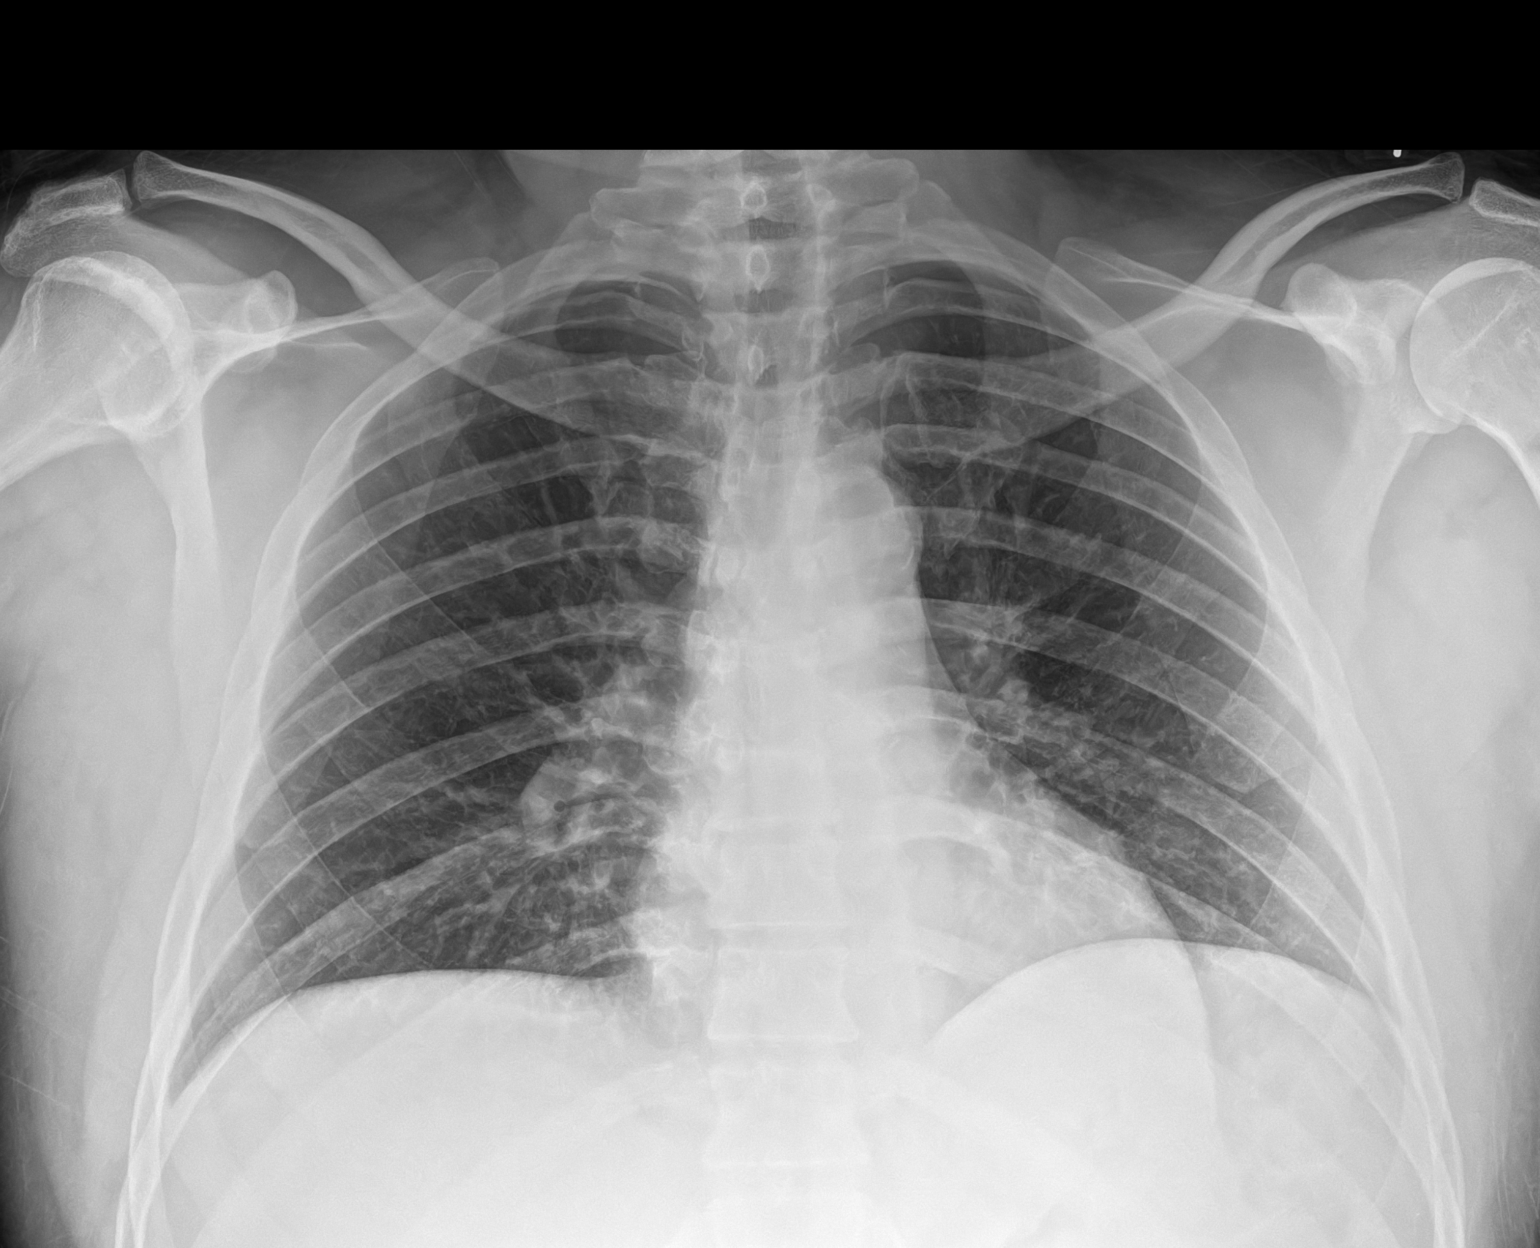

[1 of 1 positions shown; findings below may reference images not displayed]

FINDINGS: The cardiac and mediastinal silhouettes are stable in size and
contour, and remain within normal limits. Aortic atherosclerosis.

The lungs are mildly hypoinflated. No airspace consolidation,
pleural effusion, or pulmonary edema. No pneumothorax.

No acute osseous abnormality.
IMPRESSION: 1. No radiographic evidence for active cardiopulmonary disease.
2.  Aortic Atherosclerosis ([DZ]-[DZ]).

## 2020-04-12 MED ORDER — SODIUM CHLORIDE 0.9 % IV BOLUS
1000.0000 mL | Freq: Once | INTRAVENOUS | Status: AC
  Administered 2020-04-12: 1000 mL via INTRAVENOUS

## 2020-04-12 MED ORDER — TRULICITY 1.5 MG/0.5ML ~~LOC~~ SOAJ: 1.5000 mg | SUBCUTANEOUS | 0 refills | Status: DC

## 2020-04-12 MED ORDER — INSULIN ASPART 100 UNIT/ML ~~LOC~~ SOLN
8.0000 [IU] | Freq: Once | SUBCUTANEOUS | Status: AC
  Administered 2020-04-12: 8 [IU] via SUBCUTANEOUS
  Filled 2020-04-12: qty 1

## 2020-04-12 NOTE — ED Triage Notes (Signed)
Pt c/o of generalized pain that started x 4 days with dysuria

## 2020-04-12 NOTE — Discharge Instructions (Addendum)
Your provider prescribed your trulicity yesterday.  Please take as directed.  Tylenol for bodyaches
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
## 2020-04-13 ENCOUNTER — Telehealth (INDEPENDENT_AMBULATORY_CARE_PROVIDER_SITE_OTHER): Payer: 59 | Admitting: Internal Medicine

## 2020-04-13 ENCOUNTER — Encounter: Payer: Self-pay | Admitting: Family Medicine

## 2020-04-13 ENCOUNTER — Encounter: Payer: Self-pay | Admitting: Internal Medicine

## 2020-04-13 VITALS — BP 132/89 | Ht 68.0 in | Wt 215.0 lb

## 2020-04-13 DIAGNOSIS — Z20822 Contact with and (suspected) exposure to covid-19: Secondary | ICD-10-CM

## 2020-04-13 DIAGNOSIS — J069 Acute upper respiratory infection, unspecified: Secondary | ICD-10-CM

## 2020-04-13 MED ORDER — BENZONATATE 200 MG PO CAPS
200.0000 mg | ORAL_CAPSULE | Freq: Two times a day (BID) | ORAL | 0 refills | Status: AC | PRN
Start: 2020-04-13 — End: 2020-04-23

## 2020-04-13 NOTE — Patient Instructions (Addendum)
Please take Tylenol as needed for pain/fever up to 4 times in a day.  Please take Benzonatate for cough up to twice daily.  Please go to ER if you have shortness of breath (I.e., unable to speak in full sentence, inability to walk due to shortness of breath).  Please call us back if symptoms don't improve in next 5 days.  Upper Respiratory Infection, Adult An upper respiratory infection (URI) is a common viral infection of the nose, throat, and upper air passages that lead to the lungs. The most common type of URI is the common cold. URIs usually get better on their own, without medical treatment. What are the causes? A URI is caused by a virus. You may catch a virus by:  Breathing in droplets from an infected person's cough or sneeze.  Touching something that has been exposed to the virus (contaminated) and then touching your mouth, nose, or eyes. What increases the risk? You are more likely to get a URI if:  You are very young or very old.  It is autumn or winter.  You have close contact with others, such as at a daycare, school, or health care facility.  You smoke.  You have long-term (chronic) heart or lung disease.  You have a weakened disease-fighting (immune) system.  You have nasal allergies or asthma.  You are experiencing a lot of stress.  You work in an area that has poor air circulation.  You have poor nutrition. What are the signs or symptoms? A URI usually involves some of the following symptoms:  Runny or stuffy (congested) nose.  Sneezing.  Cough.  Sore throat.  Headache.  Fatigue.  Fever.  Loss of appetite.  Pain in your forehead, behind your eyes, and over your cheekbones (sinus pain).  Muscle aches.  Redness or irritation of the eyes.  Pressure in the ears or face. How is this diagnosed? This condition may be diagnosed based on your medical history and symptoms, and a physical exam. Your health care provider may use a cotton swab to  take a mucus sample from your nose (nasal swab). This sample can be tested to determine what virus is causing the illness. How is this treated? URIs usually get better on their own within 7-10 days. You can take steps at home to relieve your symptoms. Medicines cannot cure URIs, but your health care provider may recommend certain medicines to help relieve symptoms, such as:  Over-the-counter cold medicines.  Cough suppressants. Coughing is a type of defense against infection that helps to clear the respiratory system, so take these medicines only as recommended by your health care provider.  Fever-reducing medicines. Follow these instructions at home: Activity  Rest as needed.  If you have a fever, stay home from work or school until your fever is gone or until your health care provider says you are no longer contagious. Your health care provider may have you wear a face mask to prevent your infection from spreading. Relieving symptoms  Gargle with a salt-water mixture 3-4 times a day or as needed. To make a salt-water mixture, completely dissolve -1 tsp of salt in 1 cup of warm water.  Use a cool-mist humidifier to add moisture to the air. This can help you breathe more easily. Eating and drinking   Drink enough fluid to keep your urine pale yellow.  Eat soups and other clear broths. General instructions   Take over-the-counter and prescription medicines only as told by your health care provider. These  include cold medicines, fever reducers, and cough suppressants.  Do not use any products that contain nicotine or tobacco, such as cigarettes and e-cigarettes. If you need help quitting, ask your health care provider.  Stay away from secondhand smoke.  Stay up to date on all immunizations, including the yearly (annual) flu vaccine.  Keep all follow-up visits as told by your health care provider. This is important. How to prevent the spread of infection to others   URIs can be  passed from person to person (are contagious). To prevent the infection from spreading: ? Wash your hands often with soap and water. If soap and water are not available, use hand sanitizer. ? Avoid touching your mouth, face, eyes, or nose. ? Cough or sneeze into a tissue or your sleeve or elbow instead of into your hand or into the air. Contact a health care provider if:  You are getting worse instead of better.  You have a fever or chills.  Your mucus is brown or red.  You have yellow or brown discharge coming from your nose.  You have pain in your face, especially when you bend forward.  You have swollen neck glands.  You have pain while swallowing.  You have white areas in the back of your throat. Get help right away if:  You have shortness of breath that gets worse.  You have severe or persistent: ? Headache. ? Ear pain. ? Sinus pain. ? Chest pain.  You have chronic lung disease along with any of the following: ? Wheezing. ? Prolonged cough. ? Coughing up blood. ? A change in your usual mucus.  You have a stiff neck.  You have changes in your: ? Vision. ? Hearing. ? Thinking. ? Mood. Summary  An upper respiratory infection (URI) is a common infection of the nose, throat, and upper air passages that lead to the lungs.  A URI is caused by a virus.  URIs usually get better on their own within 7-10 days.  Medicines cannot cure URIs, but your health care provider may recommend certain medicines to help relieve symptoms. This information is not intended to replace advice given to you by your health care provider. Make sure you discuss any questions you have with your health care provider. Document Revised: 07/04/2018 Document Reviewed: 02/09/2017 Elsevier Patient Education  [place].
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
## 2020-05-11 ENCOUNTER — Other Ambulatory Visit: Payer: Self-pay | Admitting: *Deleted

## 2020-05-11 DIAGNOSIS — E1165 Type 2 diabetes mellitus with hyperglycemia: Secondary | ICD-10-CM

## 2020-05-11 MED ORDER — TRULICITY 1.5 MG/0.5ML ~~LOC~~ SOAJ
1.5000 mg | SUBCUTANEOUS | 3 refills | Status: DC
Start: 2020-05-11 — End: 2020-06-18

## 2020-06-08 ENCOUNTER — Other Ambulatory Visit: Payer: Self-pay

## 2020-06-08 DIAGNOSIS — I10 Essential (primary) hypertension: Secondary | ICD-10-CM

## 2020-06-08 MED ORDER — LISINOPRIL 5 MG PO TABS: 5.0000 mg | ORAL_TABLET | Freq: Every day | ORAL | 1 refills | Status: DC

## 2020-06-18 ENCOUNTER — Other Ambulatory Visit: Payer: Self-pay

## 2020-06-18 DIAGNOSIS — E1165 Type 2 diabetes mellitus with hyperglycemia: Secondary | ICD-10-CM

## 2020-06-18 MED ORDER — TRULICITY 1.5 MG/0.5ML ~~LOC~~ SOAJ: 1.5000 mg | SUBCUTANEOUS | 3 refills | Status: DC

## 2020-07-21 ENCOUNTER — Encounter: Payer: 59 | Admitting: Nurse Practitioner

## 2020-07-27 ENCOUNTER — Encounter: Payer: 59 | Admitting: Nurse Practitioner

## 2020-08-04 ENCOUNTER — Encounter: Payer: 59 | Admitting: Nurse Practitioner

## 2020-08-05 ENCOUNTER — Encounter: Payer: 59 | Admitting: Nurse Practitioner

## 2020-08-13 ENCOUNTER — Other Ambulatory Visit: Payer: Self-pay

## 2020-08-13 ENCOUNTER — Ambulatory Visit (INDEPENDENT_AMBULATORY_CARE_PROVIDER_SITE_OTHER): Payer: 59 | Admitting: Nurse Practitioner

## 2020-08-13 ENCOUNTER — Encounter: Payer: Self-pay | Admitting: Nurse Practitioner

## 2020-08-13 VITALS — BP 127/81 | HR 69 | Temp 98.6°F | Resp 20 | Ht 68.0 in | Wt 214.0 lb

## 2020-08-13 DIAGNOSIS — M25512 Pain in left shoulder: Secondary | ICD-10-CM

## 2020-08-13 DIAGNOSIS — Z139 Encounter for screening, unspecified: Secondary | ICD-10-CM

## 2020-08-13 DIAGNOSIS — R221 Localized swelling, mass and lump, neck: Secondary | ICD-10-CM | POA: Insufficient documentation

## 2020-08-13 DIAGNOSIS — Z0000 Encounter for general adult medical examination without abnormal findings: Secondary | ICD-10-CM

## 2020-08-13 DIAGNOSIS — G8929 Other chronic pain: Secondary | ICD-10-CM

## 2020-08-13 DIAGNOSIS — M25511 Pain in right shoulder: Secondary | ICD-10-CM

## 2020-08-13 DIAGNOSIS — E782 Mixed hyperlipidemia: Secondary | ICD-10-CM | POA: Diagnosis not present

## 2020-08-13 DIAGNOSIS — I10 Essential (primary) hypertension: Secondary | ICD-10-CM | POA: Diagnosis not present

## 2020-08-13 DIAGNOSIS — E1165 Type 2 diabetes mellitus with hyperglycemia: Secondary | ICD-10-CM

## 2020-08-13 NOTE — Assessment & Plan Note (Signed)
-  this has been chronic -he had U/S performed in March 2021, and per imaging this was a benign lymph node

## 2020-08-13 NOTE — Assessment & Plan Note (Signed)
-  draw A1c with this set of labs -may changed meds based on A1c -currently taking Trulicity, and glipizide -on ACEi; no statin, but taking zetia

## 2020-08-13 NOTE — Patient Instructions (Signed)
It was great meeting you today.  The imaging from March of last year showed that the neck mass was a benign (non-cancerous) lymph node.  You got a flu shot today.  We discussed a colonoscopy today, and we will get that screening set up with a GI doctor and they will discuss insurance coverage with you. We know we can get it at age 49 without issues, but the 16-49 age range is fairly new, so there may be insurance coverage issues.  Please call back with the dates of your COVID shots after you look at your cards at home.  We will meet back up in 3 months for a lab follow-up.

## 2020-08-13 NOTE — Assessment & Plan Note (Signed)
-  drawing labs today -goal LDL < 70 d/t diabetes

## 2020-08-13 NOTE — Assessment & Plan Note (Signed)
-  BP well controlled today -Continue lisinopril
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
## 2020-08-13 NOTE — Assessment & Plan Note (Signed)
-  has seen Dr. Aline Brochure in the past -had steroid joint injections -will refer back to Dr. Aline Brochure

## 2020-08-16 ENCOUNTER — Encounter: Payer: Self-pay | Admitting: Internal Medicine

## 2020-08-18 ENCOUNTER — Encounter: Payer: Self-pay | Admitting: Orthopaedic Surgery

## 2020-09-06 ENCOUNTER — Telehealth: Payer: Self-pay | Admitting: *Deleted

## 2020-09-06 NOTE — Telephone Encounter (Signed)
Called pt to remind him of nurse visit on 09/07/2020.  Wife answered.  She informed me that she knew why I was calling and told me that pt's mother had passed.  Requested for me to call them back in a week or so to reschedule.

## 2020-09-07 ENCOUNTER — Ambulatory Visit: Payer: 59

## 2020-09-14 ENCOUNTER — Encounter: Payer: Self-pay | Admitting: *Deleted

## 2020-09-14 NOTE — Telephone Encounter (Signed)
Lmom for pt to call me back. 

## 2020-09-14 NOTE — Telephone Encounter (Addendum)
Mailed letter to pt to contact our office to reschedule his previous nurse visit appointment.

## 2020-09-17 ENCOUNTER — Ambulatory Visit: Payer: 59 | Admitting: Orthopedic Surgery

## 2020-09-17 ENCOUNTER — Ambulatory Visit: Payer: 59

## 2020-09-17 ENCOUNTER — Encounter: Payer: Self-pay | Admitting: Orthopedic Surgery

## 2020-09-17 ENCOUNTER — Other Ambulatory Visit: Payer: Self-pay

## 2020-09-17 VITALS — BP 128/86 | HR 92 | Ht 68.0 in | Wt 213.2 lb

## 2020-09-17 DIAGNOSIS — M25512 Pain in left shoulder: Secondary | ICD-10-CM | POA: Diagnosis not present

## 2020-09-17 DIAGNOSIS — M25511 Pain in right shoulder: Secondary | ICD-10-CM

## 2020-09-17 DIAGNOSIS — M7542 Impingement syndrome of left shoulder: Secondary | ICD-10-CM

## 2020-09-17 DIAGNOSIS — M7541 Impingement syndrome of right shoulder: Secondary | ICD-10-CM

## 2020-09-17 MED ORDER — DICLOFENAC-MISOPROSTOL 50-0.2 MG PO TBEC: 1.0000 | DELAYED_RELEASE_TABLET | Freq: Two times a day (BID) | ORAL | 2 refills | Status: DC
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
## 2020-10-24 ENCOUNTER — Other Ambulatory Visit: Payer: Self-pay | Admitting: Family Medicine

## 2020-10-24 DIAGNOSIS — E1165 Type 2 diabetes mellitus with hyperglycemia: Secondary | ICD-10-CM

## 2020-11-09 NOTE — Progress Notes (Signed)
Cholesterol is significantly elevated with LDL 202. Blood sugar was 320, so this is not controlled at all. Has he been seeing endocrinology.

## 2020-11-10 ENCOUNTER — Encounter: Payer: Self-pay | Admitting: Nurse Practitioner

## 2020-11-10 ENCOUNTER — Other Ambulatory Visit: Payer: Self-pay

## 2020-11-10 ENCOUNTER — Ambulatory Visit (INDEPENDENT_AMBULATORY_CARE_PROVIDER_SITE_OTHER): Payer: 59 | Admitting: Nurse Practitioner

## 2020-11-10 VITALS — BP 124/83 | HR 77 | Temp 97.9°F | Resp 20 | Ht 68.0 in | Wt 211.0 lb

## 2020-11-10 DIAGNOSIS — I10 Essential (primary) hypertension: Secondary | ICD-10-CM

## 2020-11-10 DIAGNOSIS — Z139 Encounter for screening, unspecified: Secondary | ICD-10-CM | POA: Diagnosis not present

## 2020-11-10 DIAGNOSIS — E1165 Type 2 diabetes mellitus with hyperglycemia: Secondary | ICD-10-CM | POA: Diagnosis not present

## 2020-11-10 DIAGNOSIS — F4321 Adjustment disorder with depressed mood: Secondary | ICD-10-CM

## 2020-11-10 DIAGNOSIS — E782 Mixed hyperlipidemia: Secondary | ICD-10-CM | POA: Diagnosis not present

## 2020-11-10 MED ORDER — ATORVASTATIN CALCIUM 40 MG PO TABS: 40.0000 mg | ORAL_TABLET | Freq: Every day | ORAL | 3 refills | Status: DC

## 2020-11-10 NOTE — Assessment & Plan Note (Signed)
-  lost mother in Feb 2022 -offered therapy with LCSW, but he declined -we discussed griefshare, he may go to a meeting

## 2020-11-10 NOTE — Assessment & Plan Note (Addendum)
Lab Results  Component Value Date   HGBA1C 13.9 (H) 11/08/2020   -referral to endocrinology -he states that he has been eating poorly and drinking lots of sweet tea since he found his mother dead in 08/18/22 -he stopped taking ACEi and prescribed statin today -he states that he was taking glipizide once a day instead of twice; I suspect he will need insulin therapy; encouraged to take BID d/t poor glycemic control

## 2020-11-10 NOTE — Assessment & Plan Note (Signed)
-  well controlled today 

## 2020-11-10 NOTE — Assessment & Plan Note (Signed)
Lab Results  Component Value Date   CHOL 273 (H) 11/08/2020   HDL 40 11/08/2020   LDLCALC 202 (H) 11/08/2020   TRIG 166 (H) 11/08/2020   CHOLHDL 5.9 (H) 07/28/2019  -LDL goal < 70 d/t DM -Rx. atorvastatin
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
## 2020-11-10 NOTE — Patient Instructions (Addendum)
Please have fasting labs drawn 2-3 days prior to your appointment so we can discuss the results during your office visit.  For loss of your mother, I recommend GriefShare. A quick Google search of Travis Delgado will help you find a local meeting.

## 2020-11-11 LAB — CBC WITH DIFFERENTIAL/PLATELET
Basophils Absolute: 0.1 10*3/uL (ref 0.0–0.2)
Basos: 1 %
EOS (ABSOLUTE): 0.1 10*3/uL (ref 0.0–0.4)
Eos: 1 %
Hematocrit: 45.3 % (ref 37.5–51.0)
Hemoglobin: 14.4 g/dL (ref 13.0–17.7)
Immature Grans (Abs): 0 10*3/uL (ref 0.0–0.1)
Immature Granulocytes: 0 %
Lymphocytes Absolute: 2.6 10*3/uL (ref 0.7–3.1)
Lymphs: 46 %
MCH: 26.2 pg — ABNORMAL LOW (ref 26.6–33.0)
MCHC: 31.8 g/dL (ref 31.5–35.7)
MCV: 83 fL (ref 79–97)
Monocytes Absolute: 0.5 10*3/uL (ref 0.1–0.9)
Monocytes: 9 %
Neutrophils Absolute: 2.4 10*3/uL (ref 1.4–7.0)
Neutrophils: 43 %
Platelets: 242 10*3/uL (ref 150–450)
RBC: 5.49 x10E6/uL (ref 4.14–5.80)
RDW: 13.6 % (ref 11.6–15.4)
WBC: 5.7 10*3/uL (ref 3.4–10.8)

## 2020-11-11 LAB — MICROALBUMIN / CREATININE URINE RATIO
Creatinine, Urine: 107 mg/dL
Microalb/Creat Ratio: 9 mg/g creat (ref 0–29)
Microalbumin, Urine: 9.4 ug/mL

## 2020-11-11 LAB — CMP14+EGFR
ALT: 30 IU/L (ref 0–44)
AST: 18 IU/L (ref 0–40)
Albumin/Globulin Ratio: 1.3 (ref 1.2–2.2)
Albumin: 4.4 g/dL (ref 4.0–5.0)
Alkaline Phosphatase: 60 IU/L (ref 44–121)
BUN/Creatinine Ratio: 12 (ref 9–20)
BUN: 14 mg/dL (ref 6–24)
Bilirubin Total: 0.3 mg/dL (ref 0.0–1.2)
CO2: 21 mmol/L (ref 20–29)
Calcium: 9.8 mg/dL (ref 8.7–10.2)
Chloride: 100 mmol/L (ref 96–106)
Creatinine, Ser: 1.19 mg/dL (ref 0.76–1.27)
Globulin, Total: 3.4 g/dL (ref 1.5–4.5)
Glucose: 320 mg/dL — ABNORMAL HIGH (ref 65–99)
Potassium: 4.7 mmol/L (ref 3.5–5.2)
Sodium: 136 mmol/L (ref 134–144)
Total Protein: 7.8 g/dL (ref 6.0–8.5)
eGFR: 75 mL/min/{1.73_m2} (ref >59)

## 2020-11-11 LAB — LIPID PANEL WITH LDL/HDL RATIO
Cholesterol, Total: 273 mg/dL — ABNORMAL HIGH (ref 100–199)
HDL: 40 mg/dL (ref >39)
LDL Chol Calc (NIH): 202 mg/dL — ABNORMAL HIGH (ref 0–99)
LDL/HDL Ratio: 5.1 ratio — ABNORMAL HIGH (ref 0.0–3.6)
Triglycerides: 166 mg/dL — ABNORMAL HIGH (ref 0–149)
VLDL Cholesterol Cal: 31 mg/dL (ref 5–40)

## 2020-11-11 LAB — HEMOGLOBIN A1C
Est. average glucose Bld gHb Est-mCnc: 352 mg/dL
Hgb A1c MFr Bld: 13.9 % — ABNORMAL HIGH (ref 4.8–5.6)

## 2020-11-13 LAB — HCV INTERPRETATION

## 2020-11-13 LAB — HCV AB W REFLEX TO QUANT PCR: HCV Ab: 0.1 s/co ratio (ref 0.0–0.9)

## 2020-11-13 LAB — SPECIMEN STATUS REPORT

## 2020-11-23 ENCOUNTER — Other Ambulatory Visit: Payer: Self-pay

## 2020-11-23 DIAGNOSIS — E1165 Type 2 diabetes mellitus with hyperglycemia: Secondary | ICD-10-CM

## 2020-11-23 MED ORDER — TRULICITY 1.5 MG/0.5ML ~~LOC~~ SOAJ: SUBCUTANEOUS | 3 refills | Status: DC

## 2020-11-30 ENCOUNTER — Encounter: Payer: Self-pay | Admitting: Endocrinology

## 2020-12-02 ENCOUNTER — Encounter: Payer: Self-pay | Admitting: Internal Medicine

## 2020-12-02 ENCOUNTER — Encounter: Payer: Self-pay | Admitting: Nurse Practitioner

## 2020-12-02 ENCOUNTER — Telehealth (INDEPENDENT_AMBULATORY_CARE_PROVIDER_SITE_OTHER): Payer: 59 | Admitting: Internal Medicine

## 2020-12-02 ENCOUNTER — Other Ambulatory Visit: Payer: Self-pay

## 2020-12-02 DIAGNOSIS — W57XXXA Bitten or stung by nonvenomous insect and other nonvenomous arthropods, initial encounter: Secondary | ICD-10-CM

## 2020-12-02 DIAGNOSIS — Z0100 Encounter for examination of eyes and vision without abnormal findings: Secondary | ICD-10-CM | POA: Diagnosis not present

## 2020-12-02 DIAGNOSIS — E119 Type 2 diabetes mellitus without complications: Secondary | ICD-10-CM

## 2020-12-02 MED ORDER — DOXYCYCLINE HYCLATE 100 MG PO TABS: 100.0000 mg | ORAL_TABLET | Freq: Two times a day (BID) | ORAL | 0 refills | Status: DC
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
## 2020-12-02 NOTE — Patient Instructions (Signed)

## 2021-02-10 ENCOUNTER — Ambulatory Visit: Payer: 59 | Admitting: Nurse Practitioner

## 2021-03-05 LAB — CBC WITH DIFFERENTIAL/PLATELET
Basophils Absolute: 0 10*3/uL (ref 0.0–0.2)
Basos: 1 %
EOS (ABSOLUTE): 0.1 10*3/uL (ref 0.0–0.4)
Eos: 2 %
Hematocrit: 39.9 % (ref 37.5–51.0)
Hemoglobin: 13.4 g/dL (ref 13.0–17.7)
Immature Grans (Abs): 0 10*3/uL (ref 0.0–0.1)
Immature Granulocytes: 0 %
Lymphocytes Absolute: 2.2 10*3/uL (ref 0.7–3.1)
Lymphs: 51 %
MCH: 27 pg (ref 26.6–33.0)
MCHC: 33.6 g/dL (ref 31.5–35.7)
MCV: 80 fL (ref 79–97)
Monocytes Absolute: 0.4 10*3/uL (ref 0.1–0.9)
Monocytes: 9 %
Neutrophils Absolute: 1.6 10*3/uL (ref 1.4–7.0)
Neutrophils: 37 %
Platelets: 206 10*3/uL (ref 150–450)
RBC: 4.96 x10E6/uL (ref 4.14–5.80)
RDW: 13.3 % (ref 11.6–15.4)
WBC: 4.4 10*3/uL (ref 3.4–10.8)

## 2021-03-05 LAB — CMP14+EGFR
ALT: 25 IU/L (ref 0–44)
AST: 20 IU/L (ref 0–40)
Albumin/Globulin Ratio: 1.9 (ref 1.2–2.2)
Albumin: 4.6 g/dL (ref 4.0–5.0)
Alkaline Phosphatase: 45 IU/L (ref 44–121)
BUN/Creatinine Ratio: 15 (ref 9–20)
BUN: 14 mg/dL (ref 6–24)
Bilirubin Total: 0.4 mg/dL (ref 0.0–1.2)
CO2: 21 mmol/L (ref 20–29)
Calcium: 9.2 mg/dL (ref 8.7–10.2)
Chloride: 103 mmol/L (ref 96–106)
Creatinine, Ser: 0.91 mg/dL (ref 0.76–1.27)
Globulin, Total: 2.4 g/dL (ref 1.5–4.5)
Glucose: 205 mg/dL — ABNORMAL HIGH (ref 65–99)
Potassium: 4.2 mmol/L (ref 3.5–5.2)
Sodium: 137 mmol/L (ref 134–144)
Total Protein: 7 g/dL (ref 6.0–8.5)
eGFR: 103 mL/min/{1.73_m2} (ref >59)

## 2021-03-05 LAB — LIPID PANEL WITH LDL/HDL RATIO
Cholesterol, Total: 230 mg/dL — ABNORMAL HIGH (ref 100–199)
HDL: 35 mg/dL — ABNORMAL LOW (ref >39)
LDL Chol Calc (NIH): 172 mg/dL — ABNORMAL HIGH (ref 0–99)
LDL/HDL Ratio: 4.9 ratio — ABNORMAL HIGH (ref 0.0–3.6)
Triglycerides: 127 mg/dL (ref 0–149)
VLDL Cholesterol Cal: 23 mg/dL (ref 5–40)

## 2021-03-05 LAB — HEPATITIS C ANTIBODY: Hep C Virus Ab: 0.1 s/co ratio (ref 0.0–0.9)

## 2021-03-07 ENCOUNTER — Ambulatory Visit (INDEPENDENT_AMBULATORY_CARE_PROVIDER_SITE_OTHER): Payer: 59 | Admitting: Nurse Practitioner

## 2021-03-07 ENCOUNTER — Other Ambulatory Visit: Payer: Self-pay

## 2021-03-07 ENCOUNTER — Encounter: Payer: Self-pay | Admitting: Nurse Practitioner

## 2021-03-07 VITALS — BP 120/83 | HR 69 | Temp 98.4°F | Ht 68.0 in | Wt 210.0 lb

## 2021-03-07 DIAGNOSIS — E782 Mixed hyperlipidemia: Secondary | ICD-10-CM

## 2021-03-07 DIAGNOSIS — I10 Essential (primary) hypertension: Secondary | ICD-10-CM

## 2021-03-07 DIAGNOSIS — E1165 Type 2 diabetes mellitus with hyperglycemia: Secondary | ICD-10-CM

## 2021-03-07 LAB — POCT GLYCOSYLATED HEMOGLOBIN (HGB A1C): Hemoglobin A1C: 13.3 % — AB (ref 4.0–5.6)

## 2021-03-07 MED ORDER — TRULICITY 3 MG/0.5ML ~~LOC~~ SOAJ: 3.0000 mg | SUBCUTANEOUS | 1 refills | Status: DC

## 2021-03-07 MED ORDER — SYNJARDY XR 10-1000 MG PO TB24: 1.0000 | ORAL_TABLET | Freq: Every day | ORAL | 1 refills | Status: DC

## 2021-03-07 MED ORDER — ATORVASTATIN CALCIUM 40 MG PO TABS: 40.0000 mg | ORAL_TABLET | Freq: Every day | ORAL | 3 refills | Status: DC
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
## 2021-03-07 NOTE — Assessment & Plan Note (Signed)
-  he failed to make an appt with endocrinology -A1c is 13.3 today -INCREASE trulicity -Rx. Synjardy; may benefit from adding metformin and SGLT-2 -check blood sugar logs for the next month -F/u in 1 month -refilled atorvastatin

## 2021-03-07 NOTE — Patient Instructions (Signed)
Please keep a log of your blood sugar. We will review it in 1 month.

## 2021-03-07 NOTE — Progress Notes (Signed)
LDL is elevated. I'll discuss this with him today at his appt.

## 2021-03-07 NOTE — Addendum Note (Signed)
Addended by: Laretta Bolster on: 03/07/2021 11:00 AM   Modules accepted: Orders

## 2021-03-07 NOTE — Assessment & Plan Note (Signed)
BP Readings from Last 3 Encounters:  03/07/21 120/83  11/10/20 124/83  09/17/20 128/86  -well conrolled -not on ACEi or ARB currently -may add that at future OV

## 2021-03-07 NOTE — Assessment & Plan Note (Signed)
Lab Results  Component Value Date   CHOL 230 (H) 03/04/2021   HDL 35 (L) 03/04/2021   LDLCALC 172 (H) 03/04/2021   TRIG 127 03/04/2021   CHOLHDL 5.9 (H) 07/28/2019   -Refilled atorvastatin; he never picked this up

## 2021-03-11 ENCOUNTER — Other Ambulatory Visit: Payer: Self-pay | Admitting: Nurse Practitioner

## 2021-03-11 ENCOUNTER — Encounter: Payer: Self-pay | Admitting: Nurse Practitioner

## 2021-03-11 DIAGNOSIS — E1165 Type 2 diabetes mellitus with hyperglycemia: Secondary | ICD-10-CM

## 2021-03-11 MED ORDER — PEN NEEDLES 32G X 4 MM MISC: 1.0000 | Freq: Every day | 3 refills | Status: DC

## 2021-03-11 MED ORDER — LANTUS SOLOSTAR 100 UNIT/ML ~~LOC~~ SOPN: 20.0000 [IU] | PEN_INJECTOR | Freq: Every day | SUBCUTANEOUS | 99 refills | Status: DC

## 2021-03-11 NOTE — Progress Notes (Signed)
-  Rx. Lantus, start with 20 units -he can titrate up by 2 units for every 2 consecutive days with fasting blood sugar > 150 -f/u in 1 month

## 2021-03-11 NOTE — Telephone Encounter (Signed)
I called in lantus and stopped his glipizde and syndjardy d/t side effects. He needs a f/u appt in 1 month.

## 2021-03-16 ENCOUNTER — Other Ambulatory Visit: Payer: Self-pay

## 2021-03-16 DIAGNOSIS — E1165 Type 2 diabetes mellitus with hyperglycemia: Secondary | ICD-10-CM

## 2021-03-18 ENCOUNTER — Other Ambulatory Visit: Payer: Self-pay

## 2021-03-18 ENCOUNTER — Other Ambulatory Visit: Payer: Self-pay | Admitting: Nurse Practitioner

## 2021-03-18 ENCOUNTER — Ambulatory Visit
Admission: EM | Admit: 2021-03-18 | Discharge: 2021-03-18 | Disposition: A | Discharge status: Home / Self Care | Payer: 59 | Attending: Emergency Medicine | Admitting: Emergency Medicine

## 2021-03-18 ENCOUNTER — Encounter: Payer: Self-pay | Admitting: Emergency Medicine

## 2021-03-18 DIAGNOSIS — G43009 Migraine without aura, not intractable, without status migrainosus: Secondary | ICD-10-CM | POA: Diagnosis not present

## 2021-03-18 DIAGNOSIS — G43819 Other migraine, intractable, without status migrainosus: Secondary | ICD-10-CM

## 2021-03-18 MED ORDER — ONDANSETRON HCL 4 MG PO TABS: 4.0000 mg | ORAL_TABLET | Freq: Four times a day (QID) | ORAL | 0 refills | Status: DC

## 2021-03-18 MED ORDER — SUMATRIPTAN SUCCINATE 6 MG/0.5ML ~~LOC~~ SOLN
6.0000 mg | Freq: Once | SUBCUTANEOUS | Status: AC
Start: 2021-03-18 — End: 2021-03-18
  Administered 2021-03-18: 6 mg via SUBCUTANEOUS

## 2021-03-18 MED ORDER — KETOROLAC TROMETHAMINE 30 MG/ML IJ SOLN
30.0000 mg | Freq: Once | INTRAMUSCULAR | Status: AC
  Administered 2021-03-18: 30 mg via INTRAMUSCULAR

## 2021-03-18 MED ORDER — RIZATRIPTAN BENZOATE 10 MG PO TBDP: 10.0000 mg | ORAL_TABLET | ORAL | 0 refills | Status: DC | PRN

## 2021-03-18 MED ORDER — ONDANSETRON 4 MG PO TBDP
4.0000 mg | ORAL_TABLET | Freq: Once | ORAL | Status: AC
  Administered 2021-03-18: 4 mg via ORAL

## 2021-03-18 MED ORDER — DEXAMETHASONE SODIUM PHOSPHATE 10 MG/ML IJ SOLN
10.0000 mg | Freq: Once | INTRAMUSCULAR | Status: AC
Start: 2021-03-18 — End: 2021-03-18
  Administered 2021-03-18: 10 mg via INTRAMUSCULAR

## 2021-03-18 NOTE — ED Triage Notes (Signed)
Pt presents today with c/o of headache, nausea and lower back pain x 1 week. Denies fever.

## 2021-03-18 NOTE — Discharge Instructions (Signed)
Unable to rule out brain aneurysm or subarachnoid hemorrhage in urgent care setting.  Offered patient further evaluation and management in the ED.  Patient declines at this time and would like to try outpatient therapy first.  Aware of the risk associated with this decision including missed diagnosis, organ damage, organ failure, and/or death.  Patient aware and in agreement.     Migraine cocktail given in office Rest and drink plenty of fluids Use OTC medications as needed for symptomatic relief Follow up with PCP if symptoms persists Return or go to the ER if you have any new or worsening symptoms such as fever, chills, nausea, vomiting, chest pain, shortness of breath, cough, vision changes, worsening headache despite treatment, slurred speech, facial asymmetry, weakness in arms or legs, etc..Marland Kitchen
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
## 2021-03-18 NOTE — Telephone Encounter (Signed)
I sent in [place] for the headache. I am treating it like a migraine since the aleve isn't working and he is having nausea and photosensitivity.  He will need a f/u appointment next week.

## 2021-03-30 NOTE — Patient Instructions (Signed)
Diabetes Mellitus and Nutrition, Adult When you have diabetes, or diabetes mellitus, it is very important to have healthy eating habits because your blood sugar (glucose) levels are greatly affected by what you eat and drink. Eating healthy foods in the right amounts, at about the same times every day, can help you:  Control your blood glucose.  Lower your risk of heart disease.  Improve your blood pressure.  Reach or maintain a healthy weight. What can affect my meal plan? Every person with diabetes is different, and each person has different needs for a meal plan. Your health care provider may recommend that you work with a dietitian to make a meal plan that is best for you. Your meal plan may vary depending on factors such as:  The calories you need.  The medicines you take.  Your weight.  Your blood glucose, blood pressure, and cholesterol levels.  Your activity level.  Other health conditions you have, such as heart or kidney disease. How do carbohydrates affect me? Carbohydrates, also called carbs, affect your blood glucose level more than any other type of food. Eating carbs naturally raises the amount of glucose in your blood. Carb counting is a method for keeping track of how many carbs you eat. Counting carbs is important to keep your blood glucose at a healthy level, especially if you use insulin or take certain oral diabetes medicines. It is important to know how many carbs you can safely have in each meal. This is different for every person. Your dietitian can help you calculate how many carbs you should have at each meal and for each snack. How does alcohol affect me? Alcohol can cause a sudden decrease in blood glucose (hypoglycemia), especially if you use insulin or take certain oral diabetes medicines. Hypoglycemia can be a life-threatening condition. Symptoms of hypoglycemia, such as sleepiness, dizziness, and confusion, are similar to symptoms of having too much  alcohol.  Do not drink alcohol if: ? Your health care provider tells you not to drink. ? You are pregnant, may be pregnant, or are planning to become pregnant.  If you drink alcohol: ? Do not drink on an empty stomach. ? Limit how much you use to:  0-1 drink a day for women.  0-2 drinks a day for men. ? Be aware of how much alcohol is in your drink. In the [place] one drink equals one 12 oz bottle of beer (355 mL), one 5 oz glass of wine (148 mL), or one 1 oz glass of hard liquor (44 mL). ? Keep yourself hydrated with water, diet soda, or unsweetened iced tea.  Keep in mind that regular soda, juice, and other mixers may contain a lot of sugar and must be counted as carbs. What are tips for following this plan? Reading food labels  Start by checking the serving size on the "Nutrition Facts" label of packaged foods and drinks. The amount of calories, carbs, fats, and other nutrients listed on the label is based on one serving of the item. Many items contain more than one serving per package.  Check the total grams (g) of carbs in one serving. You can calculate the number of servings of carbs in one serving by dividing the total carbs by 15. For example, if a food has 30 g of total carbs per serving, it would be equal to 2 servings of carbs.  Check the number of grams (g) of saturated fats and trans fats in one serving. Choose foods that have   a low amount or none of these fats.  Check the number of milligrams (mg) of salt (sodium) in one serving. Most people should limit total sodium intake to less than 2,300 mg per day.  Always check the nutrition information of foods labeled as "low-fat" or "nonfat." These foods may be higher in added sugar or refined carbs and should be avoided.  Talk to your dietitian to identify your daily goals for nutrients listed on the label. Shopping  Avoid buying canned, pre-made, or processed foods. These foods tend to be high in fat, sodium, and added  sugar.  Shop around the outside edge of the grocery store. This is where you will most often find fresh fruits and vegetables, bulk grains, fresh meats, and fresh dairy. Cooking  Use low-heat cooking methods, such as baking, instead of high-heat cooking methods like deep frying.  Cook using healthy oils, such as olive, canola, or sunflower oil.  Avoid cooking with butter, cream, or high-fat meats. Meal planning  Eat meals and snacks regularly, preferably at the same times every day. Avoid going long periods of time without eating.  Eat foods that are high in fiber, such as fresh fruits, vegetables, beans, and whole grains. Talk with your dietitian about how many servings of carbs you can eat at each meal.  Eat 4-6 oz (112-168 g) of lean protein each day, such as lean meat, chicken, fish, eggs, or tofu. One ounce (oz) of lean protein is equal to: ? 1 oz (28 g) of meat, chicken, or fish. ? 1 egg. ?  cup (62 g) of tofu.  Eat some foods each day that contain healthy fats, such as avocado, nuts, seeds, and fish.   What foods should I eat? Fruits Berries. Apples. Oranges. Peaches. Apricots. Plums. Grapes. Mango. Papaya. Pomegranate. Kiwi. Cherries. Vegetables Lettuce. Spinach. Leafy greens, including kale, chard, collard greens, and mustard greens. Beets. Cauliflower. Cabbage. Broccoli. Carrots. Green beans. Tomatoes. Peppers. Onions. Cucumbers. Brussels sprouts. Grains Whole grains, such as whole-wheat or whole-grain bread, crackers, tortillas, cereal, and pasta. Unsweetened oatmeal. Quinoa. Brown or wild rice. Meats and other proteins Seafood. Poultry without skin. Lean cuts of poultry and beef. Tofu. Nuts. Seeds. Dairy Low-fat or fat-free dairy products such as milk, yogurt, and cheese. The items listed above may not be a complete list of foods and beverages you can eat. Contact a dietitian for more information. What foods should I avoid? Fruits Fruits canned with  syrup. Vegetables Canned vegetables. Frozen vegetables with butter or cream sauce. Grains Refined white flour and flour products such as bread, pasta, snack foods, and cereals. Avoid all processed foods. Meats and other proteins Fatty cuts of meat. Poultry with skin. Breaded or fried meats. Processed meat. Avoid saturated fats. Dairy Full-fat yogurt, cheese, or milk. Beverages Sweetened drinks, such as soda or iced tea. The items listed above may not be a complete list of foods and beverages you should avoid. Contact a dietitian for more information. Questions to ask a health care provider  Do I need to meet with a diabetes educator?  Do I need to meet with a dietitian?  What number can I call if I have questions?  When are the best times to check my blood glucose? Where to find more information:  American Diabetes Association: diabetes.org  Academy of Nutrition and Dietetics: www.eatright.org  National Institute of Diabetes and Digestive and Kidney Diseases: www.niddk.nih.gov  Association of Diabetes Care and Education Specialists: www.diabeteseducator.org Summary  It is important to have healthy eating   habits because your blood sugar (glucose) levels are greatly affected by what you eat and drink.  A healthy meal plan will help you control your blood glucose and maintain a healthy lifestyle.  Your health care provider may recommend that you work with a dietitian to make a meal plan that is best for you.  Keep in mind that carbohydrates (carbs) and alcohol have immediate effects on your blood glucose levels. It is important to count carbs and to use alcohol carefully. This information is not intended to replace advice given to you by your health care provider. Make sure you discuss any questions you have with your health care provider. Document Revised: 06/03/2019 Document Reviewed: 06/03/2019 Elsevier Patient Education  2021 Elsevier Inc.  

## 2021-03-31 ENCOUNTER — Other Ambulatory Visit: Payer: Self-pay

## 2021-03-31 ENCOUNTER — Ambulatory Visit (INDEPENDENT_AMBULATORY_CARE_PROVIDER_SITE_OTHER): Payer: 59 | Admitting: Nurse Practitioner

## 2021-03-31 ENCOUNTER — Encounter: Payer: Self-pay | Admitting: Nurse Practitioner

## 2021-03-31 VITALS — BP 121/79 | HR 79 | Ht 68.0 in | Wt 210.6 lb

## 2021-03-31 DIAGNOSIS — E1165 Type 2 diabetes mellitus with hyperglycemia: Secondary | ICD-10-CM | POA: Diagnosis not present

## 2021-03-31 DIAGNOSIS — I10 Essential (primary) hypertension: Secondary | ICD-10-CM

## 2021-03-31 DIAGNOSIS — E782 Mixed hyperlipidemia: Secondary | ICD-10-CM | POA: Diagnosis not present

## 2021-03-31 MED ORDER — LANTUS SOLOSTAR 100 UNIT/ML ~~LOC~~ SOPN: 30.0000 [IU] | PEN_INJECTOR | Freq: Every day | SUBCUTANEOUS | Status: DC
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
## 2021-04-04 ENCOUNTER — Encounter: Payer: Self-pay | Admitting: Internal Medicine

## 2021-04-04 ENCOUNTER — Ambulatory Visit: Payer: 59 | Admitting: Nurse Practitioner

## 2021-04-04 ENCOUNTER — Ambulatory Visit (INDEPENDENT_AMBULATORY_CARE_PROVIDER_SITE_OTHER): Payer: 59 | Admitting: Internal Medicine

## 2021-04-04 ENCOUNTER — Other Ambulatory Visit: Payer: Self-pay

## 2021-04-04 VITALS — BP 122/78 | Resp 18 | Ht 68.0 in | Wt 213.1 lb

## 2021-04-04 DIAGNOSIS — E1165 Type 2 diabetes mellitus with hyperglycemia: Secondary | ICD-10-CM

## 2021-04-04 DIAGNOSIS — Z0001 Encounter for general adult medical examination with abnormal findings: Secondary | ICD-10-CM | POA: Diagnosis not present

## 2021-04-04 DIAGNOSIS — Z0100 Encounter for examination of eyes and vision without abnormal findings: Secondary | ICD-10-CM

## 2021-04-04 DIAGNOSIS — E119 Type 2 diabetes mellitus without complications: Secondary | ICD-10-CM

## 2021-04-04 DIAGNOSIS — R252 Cramp and spasm: Secondary | ICD-10-CM

## 2021-04-04 DIAGNOSIS — E782 Mixed hyperlipidemia: Secondary | ICD-10-CM | POA: Diagnosis not present

## 2021-04-04 MED ORDER — CYCLOBENZAPRINE HCL 5 MG PO TABS: 5.0000 mg | ORAL_TABLET | Freq: Three times a day (TID) | ORAL | 1 refills | Status: DC | PRN
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
## 2021-04-04 NOTE — Assessment & Plan Note (Signed)
Lipid profile reviewed On statin 

## 2021-04-04 NOTE — Assessment & Plan Note (Signed)
Lab Results  Component Value Date   HGBA1C 13.3 (A) 60/47/9987   On Trulicity and Lantus 30 U qHS Advised to follow diabetic diet On statin Diabetic foot exam: Today Diabetic eye exam: Advised to follow up with Ophthalmology for diabetic eye exam

## 2021-04-04 NOTE — Patient Instructions (Addendum)
Please continue to take medications as prescribed.  Please continue to follow low carb diet and ambulate as tolerated.  Please take Flexeril for severe muscle cramps.

## 2021-04-18 ENCOUNTER — Ambulatory Visit: Payer: 59 | Admitting: Nurse Practitioner

## 2021-04-18 DIAGNOSIS — E1165 Type 2 diabetes mellitus with hyperglycemia: Secondary | ICD-10-CM

## 2021-04-18 DIAGNOSIS — E782 Mixed hyperlipidemia: Secondary | ICD-10-CM

## 2021-04-18 DIAGNOSIS — I10 Essential (primary) hypertension: Secondary | ICD-10-CM

## 2021-04-21 ENCOUNTER — Ambulatory Visit: Payer: 59 | Admitting: Nurse Practitioner

## 2021-04-21 DIAGNOSIS — E782 Mixed hyperlipidemia: Secondary | ICD-10-CM

## 2021-04-21 DIAGNOSIS — E1165 Type 2 diabetes mellitus with hyperglycemia: Secondary | ICD-10-CM

## 2021-04-21 DIAGNOSIS — I10 Essential (primary) hypertension: Secondary | ICD-10-CM

## 2021-04-25 ENCOUNTER — Other Ambulatory Visit: Payer: Self-pay

## 2021-04-25 DIAGNOSIS — G43819 Other migraine, intractable, without status migrainosus: Secondary | ICD-10-CM

## 2021-04-25 MED ORDER — RIZATRIPTAN BENZOATE 10 MG PO TBDP: 10.0000 mg | ORAL_TABLET | ORAL | 0 refills | Status: DC | PRN

## 2021-05-03 ENCOUNTER — Telehealth: Payer: Self-pay

## 2021-05-03 NOTE — Telephone Encounter (Signed)
Contact spouse [name] [phone]

## 2021-05-03 NOTE — Telephone Encounter (Signed)
Patient called said that Dr Posey Pronto will continue his care, patient does not want to see Pearline Cables.  He called asking if he can be switch to Posey Pronto he was seeing Jarrett Soho in the past.  He really likes to see Dr Posey Pronto.  Can he switch to Dr Posey Pronto so we can keep him in our practice?

## 2021-05-09 ENCOUNTER — Other Ambulatory Visit: Payer: Self-pay | Admitting: *Deleted

## 2021-05-09 DIAGNOSIS — E1165 Type 2 diabetes mellitus with hyperglycemia: Secondary | ICD-10-CM

## 2021-05-09 MED ORDER — TRULICITY 3 MG/0.5ML ~~LOC~~ SOAJ: 3.0000 mg | SUBCUTANEOUS | 1 refills | Status: DC

## 2021-06-03 NOTE — Telephone Encounter (Signed)
No, I will print out and put in the box.

## 2021-06-23 ENCOUNTER — Other Ambulatory Visit: Payer: Self-pay | Admitting: Internal Medicine

## 2021-06-23 DIAGNOSIS — G43819 Other migraine, intractable, without status migrainosus: Secondary | ICD-10-CM

## 2021-07-01 ENCOUNTER — Other Ambulatory Visit: Payer: Self-pay | Admitting: Internal Medicine

## 2021-07-01 DIAGNOSIS — R252 Cramp and spasm: Secondary | ICD-10-CM

## 2021-07-13 ENCOUNTER — Other Ambulatory Visit: Payer: Self-pay | Admitting: Internal Medicine

## 2021-07-13 DIAGNOSIS — E1165 Type 2 diabetes mellitus with hyperglycemia: Secondary | ICD-10-CM

## 2021-07-25 ENCOUNTER — Encounter: Payer: Self-pay | Admitting: Nurse Practitioner

## 2021-07-25 ENCOUNTER — Ambulatory Visit (INDEPENDENT_AMBULATORY_CARE_PROVIDER_SITE_OTHER): Payer: 59 | Admitting: Nurse Practitioner

## 2021-07-25 ENCOUNTER — Other Ambulatory Visit: Payer: Self-pay

## 2021-07-25 VITALS — BP 132/85 | HR 78 | Ht 68.0 in | Wt 221.4 lb

## 2021-07-25 DIAGNOSIS — E1165 Type 2 diabetes mellitus with hyperglycemia: Secondary | ICD-10-CM | POA: Diagnosis not present

## 2021-07-25 DIAGNOSIS — I10 Essential (primary) hypertension: Secondary | ICD-10-CM

## 2021-07-25 DIAGNOSIS — E782 Mixed hyperlipidemia: Secondary | ICD-10-CM

## 2021-07-25 LAB — POCT GLYCOSYLATED HEMOGLOBIN (HGB A1C): HbA1c, POC (controlled diabetic range): 9 % — AB (ref 0.0–7.0)

## 2021-07-25 MED ORDER — TRULICITY 4.5 MG/0.5ML ~~LOC~~ SOAJ: 4.5000 mg | SUBCUTANEOUS | 3 refills | Status: DC
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
## 2021-07-25 NOTE — Patient Instructions (Signed)
Diabetes Mellitus Emergency Preparedness Plan A diabetes emergency preparedness plan is a checklist to make sure you have everything you need to manage your diabetes in case of an emergency, such as an evacuation, natural disaster, national security emergency, or pandemic lockdown. Managing your diabetes is something you have to do all day every day. The American Diabetes Association and the [employer] of Endocrinology both recommend putting together an emergency diabetes kit. Your kit should include important information and documents as well as all the supplies you will need to manage your diabetes for at least 1 week. Store it in a portable, Engineer, materials. The best time to start making your emergency kit is now. How to make your emergency kit Collect information and documents Include the following information and documents in your kit: The type of diabetes you have. A copy of your health insurance cards and photo ID. A list of all your other medical conditions, allergies, and surgeries. A list of all your medicines and doses with the contact information for your pharmacy. Ask your health care provider for a list of your current medicines. Any recent lab results, including your latest hemoglobin A1C (HbA1C). The make, model, and serial number of your insulin pump, if you use one. Also include contact information for the manufacturer. Contact information for people who should be notified in case of an emergency. Include your health care provider's name, address, and phone number. Collect diabetes care items Include the following diabetes care items in your kit: At least a 1-week supply of: Oral medicines. Insulin. Blood glucose testing supplies. These include testing strips, lancets, and extra batteries for your blood glucose monitor and pump. A charger for the continuous glucose monitor (CGM) receiver and pump. Any extra supplies needed for your CGM or pump. A supply of  glucagon, glucose tablets, juice, soda, or hard candy in case of hypoglycemia. Coolers or cold packs. A safe container for syringes, needles, and lancets.  Other preparations Other things to consider doing as part of your emergency plan: Make sure that your mobile phone is charged and that you have an extra charger, cable, or batteries. Choose a meeting place for family members. Wear a medical alert or ID bracelet. If you have a child with diabetes, make sure your child's school has a copy of his or her emergency plan, including the name of the staff member who will assist your child. Where to find more information American Diabetes Association: www.diabetes.org Centers for Disease Control and Prevention: blogs.[employer].com.cy Summary A diabetes emergency preparedness plan is a checklist to make sure you have everything you need in case of an emergency. Your kit should include important information and documents as well as all the supplies you will need to manage your condition for at least 1 week. Store your kit in a portable, Engineer, materials. The best time to start making your emergency kit is now. This information is not intended to replace advice given to you by your health care provider. Make sure you discuss any questions you have with your health care provider. Document Revised: 01/01/2020 Document Reviewed: 01/01/2020 Elsevier Patient Education  [place].

## 2021-08-03 ENCOUNTER — Other Ambulatory Visit: Payer: Self-pay | Admitting: *Deleted

## 2021-08-03 DIAGNOSIS — E1165 Type 2 diabetes mellitus with hyperglycemia: Secondary | ICD-10-CM

## 2021-08-03 MED ORDER — LANTUS SOLOSTAR 100 UNIT/ML ~~LOC~~ SOPN: 30.0000 [IU] | PEN_INJECTOR | Freq: Every day | SUBCUTANEOUS | Status: DC

## 2021-08-05 ENCOUNTER — Ambulatory Visit (HOSPITAL_COMMUNITY)
Admission: RE | Admit: 2021-08-05 | Discharge: 2021-08-05 | Disposition: A | Discharge status: Home / Self Care | Payer: 59 | Source: Ambulatory Visit | Attending: Internal Medicine | Admitting: Internal Medicine

## 2021-08-05 ENCOUNTER — Other Ambulatory Visit: Payer: Self-pay

## 2021-08-05 ENCOUNTER — Encounter: Payer: Self-pay | Admitting: Internal Medicine

## 2021-08-05 ENCOUNTER — Ambulatory Visit: Payer: 59 | Admitting: Internal Medicine

## 2021-08-05 VITALS — BP 128/88 | HR 77 | Resp 18 | Ht 68.0 in | Wt 219.0 lb

## 2021-08-05 DIAGNOSIS — E782 Mixed hyperlipidemia: Secondary | ICD-10-CM

## 2021-08-05 DIAGNOSIS — I10 Essential (primary) hypertension: Secondary | ICD-10-CM | POA: Diagnosis not present

## 2021-08-05 DIAGNOSIS — Z1329 Encounter for screening for other suspected endocrine disorder: Secondary | ICD-10-CM

## 2021-08-05 DIAGNOSIS — M25512 Pain in left shoulder: Secondary | ICD-10-CM

## 2021-08-05 DIAGNOSIS — M25511 Pain in right shoulder: Secondary | ICD-10-CM

## 2021-08-05 DIAGNOSIS — Z114 Encounter for screening for human immunodeficiency virus [HIV]: Secondary | ICD-10-CM

## 2021-08-05 DIAGNOSIS — E1165 Type 2 diabetes mellitus with hyperglycemia: Secondary | ICD-10-CM | POA: Diagnosis not present

## 2021-08-05 DIAGNOSIS — M545 Low back pain, unspecified: Secondary | ICD-10-CM | POA: Diagnosis not present

## 2021-08-05 DIAGNOSIS — E559 Vitamin D deficiency, unspecified: Secondary | ICD-10-CM

## 2021-08-05 DIAGNOSIS — Z2821 Immunization not carried out because of patient refusal: Secondary | ICD-10-CM

## 2021-08-05 DIAGNOSIS — G8929 Other chronic pain: Secondary | ICD-10-CM

## 2021-08-05 DIAGNOSIS — Z1211 Encounter for screening for malignant neoplasm of colon: Secondary | ICD-10-CM

## 2021-08-05 IMAGING — DX DG LUMBAR SPINE COMPLETE 4+V
5 series · 5 of 5 positions shown · non-contrast
Comparison: None.

CLINICAL DATA: Low back pain.

EXAM:
LUMBAR SPINE - COMPLETE 4+ VIEW

[l-spine ap]
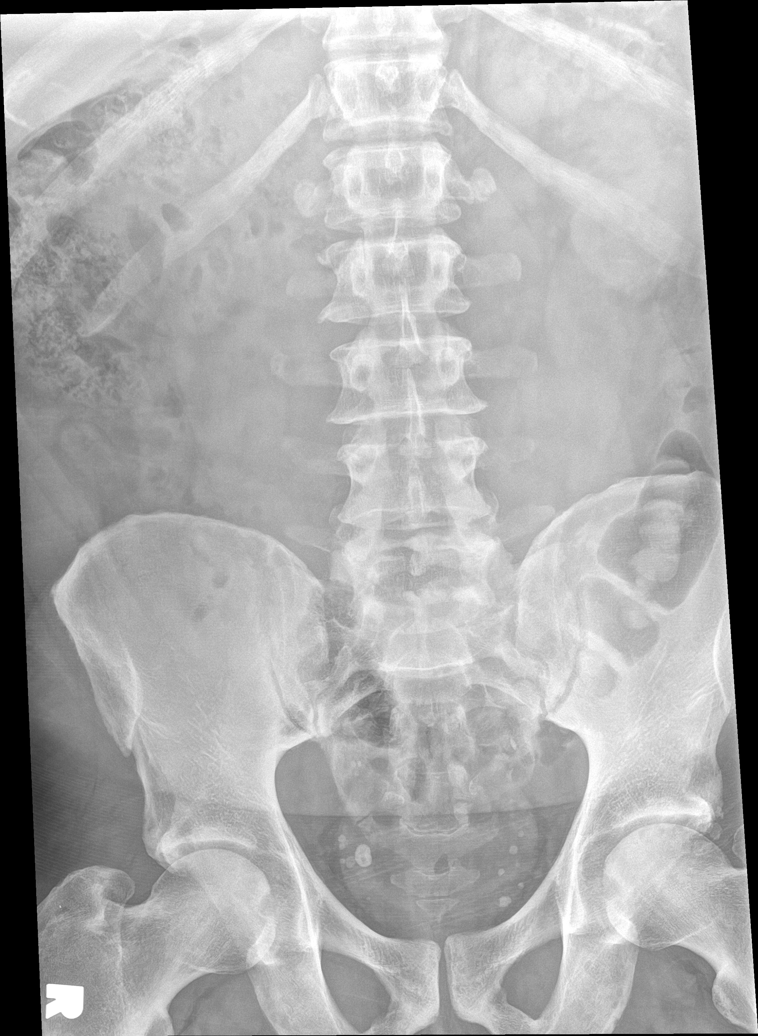

[l-spine obl (1 of 2)]
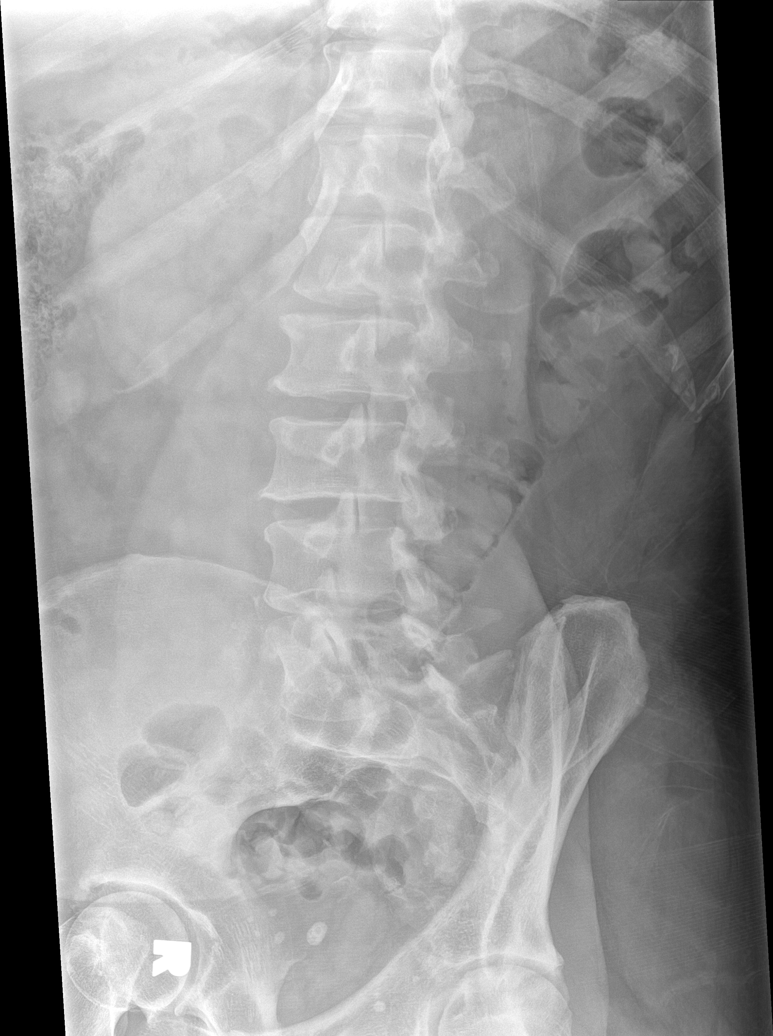

[l-spine obl (2 of 2)]
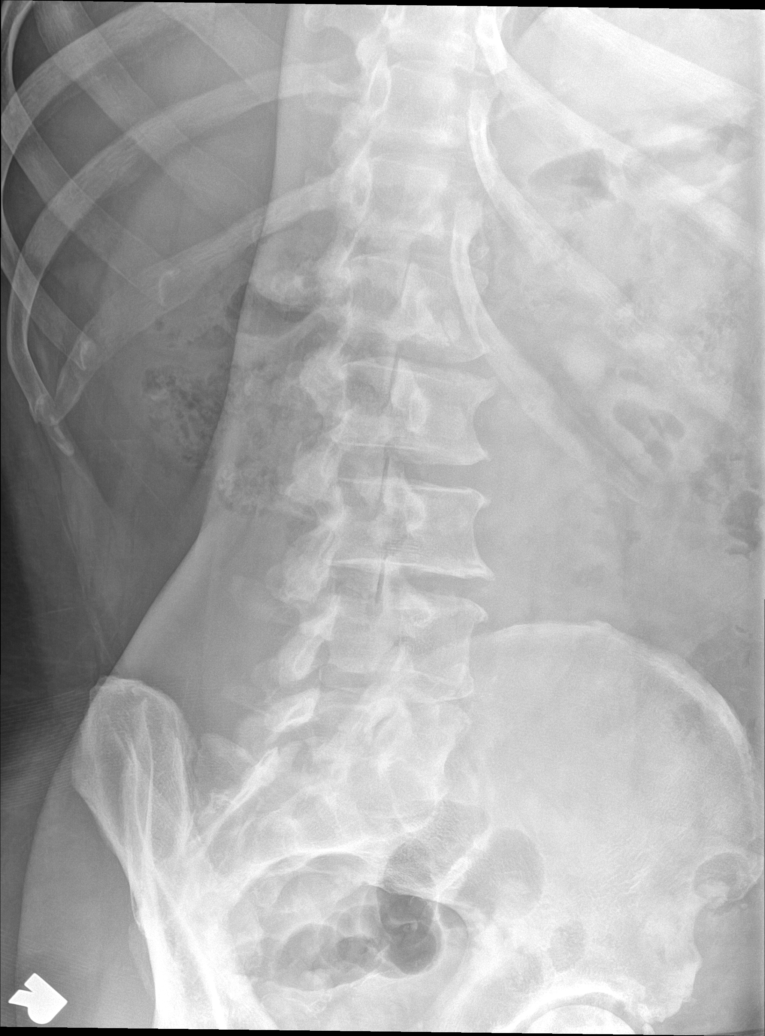

[l-spine lat]
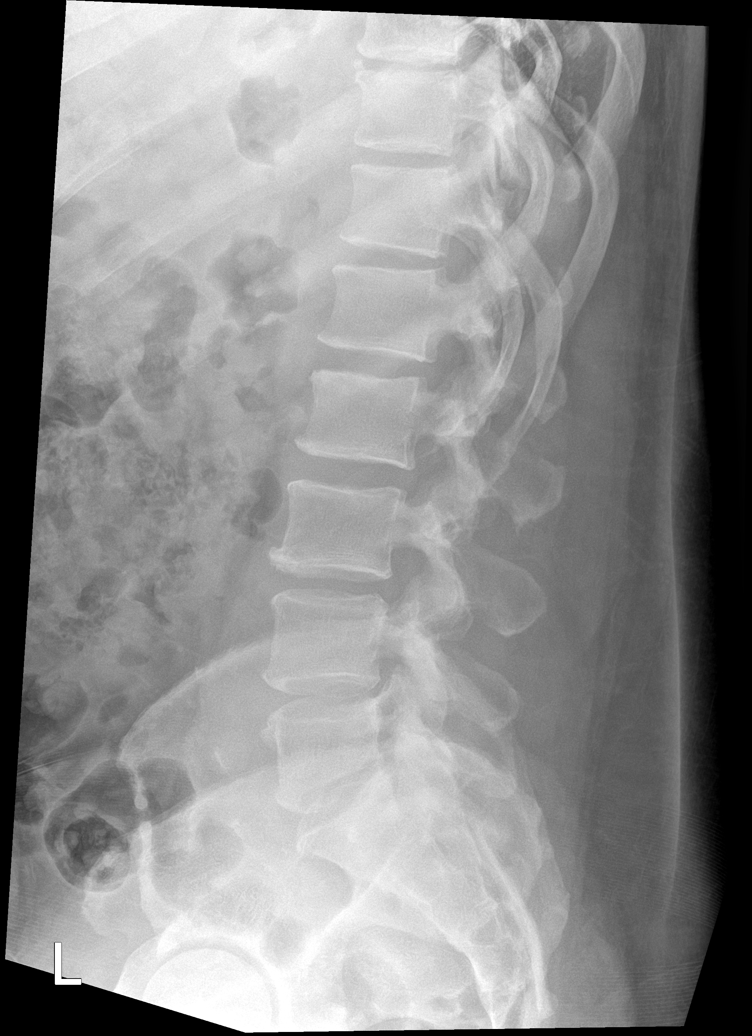

[l-spine spot]
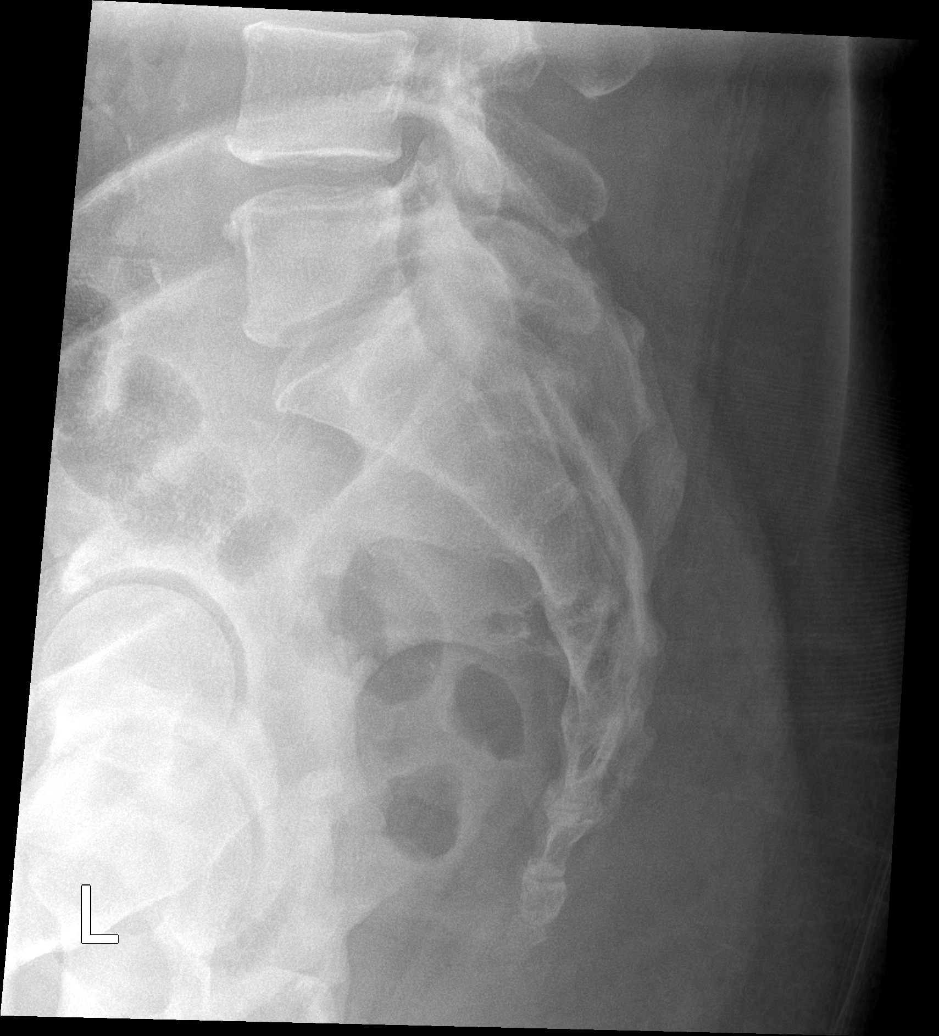

[5 of 5 positions shown; findings below may reference images not displayed]

FINDINGS: There is no evidence for fracture. Alignment is anatomic. There is
mild disc space narrowing and endplate osteophyte formation at
L3-L4, L4-L5 and L5-S1 compatible with degenerative change. Soft
tissues are within normal limits.
IMPRESSION: 1. No acute fracture or subluxation.
2. Mild degenerative changes of the lower lumbar spine.

## 2021-08-05 MED ORDER — IBUPROFEN 600 MG PO TABS: 600.0000 mg | ORAL_TABLET | Freq: Three times a day (TID) | ORAL | 0 refills | Status: DC | PRN

## 2021-08-05 NOTE — Assessment & Plan Note (Signed)
Lab Results  Component Value Date   HGBA1C 9.0 (A) 07/25/2021   Improved from 48.4 On Trulicity and Lantus 30 U qHS Followed by Endocrinology - Recently increased dose of Trulicity, but he does not have it due to shortage, advised to contact Endocrinology for alternative as his blood glucose has been more than 200 in the afternoon Advised to take Lantus 40 U until he finds alternative for Trulicity Advised to follow diabetic diet On statin Diabetic eye exam: Advised to follow up with Ophthalmology for diabetic eye exam

## 2021-08-05 NOTE — Assessment & Plan Note (Signed)
Lipid profile reviewed On statin, but does not take it regularly due to leg pain, advised to take it QOD for now

## 2021-08-05 NOTE — Assessment & Plan Note (Signed)
Chronic, advised to contact Orthopedic surgeon
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
## 2021-08-05 NOTE — Assessment & Plan Note (Signed)
Likely muscle strain from heavy lifting As it has been present for 1 month, will check X-ray lumbar spine Ibuprofen PRN Flexeril PRN Heating pad and/or back brace Simple back exercises

## 2021-08-05 NOTE — Assessment & Plan Note (Signed)
BP Readings from Last 1 Encounters:  08/05/21 128/88   Well-controlled now with diet modification Advised DASH diet and moderate exercise/walking, at least 150 mins/week

## 2021-08-05 NOTE — Patient Instructions (Signed)
Please take Ibuprofen for back pain.  Please contact Orthopedic surgeon for shoulder pain.  Please contact your Endocrinologist for alternative for Trulicity. Until you find alternative, please take Lantus 40 U for now.  Please take Atorvastatin every other day for now.

## 2021-08-08 ENCOUNTER — Telehealth: Payer: Self-pay | Admitting: Internal Medicine

## 2021-08-08 NOTE — Telephone Encounter (Signed)
Travis Delgado -is calling he is wanting to know what he can take for the back pain

## 2021-08-08 NOTE — Telephone Encounter (Signed)
Pt advised with verbal understanding  °

## 2021-08-09 ENCOUNTER — Encounter: Payer: Self-pay | Admitting: Internal Medicine

## 2021-08-15 ENCOUNTER — Telehealth: Payer: Self-pay | Admitting: Nurse Practitioner

## 2021-08-15 ENCOUNTER — Other Ambulatory Visit: Payer: Self-pay | Admitting: *Deleted

## 2021-08-15 ENCOUNTER — Telehealth: Payer: Self-pay | Admitting: Internal Medicine

## 2021-08-15 ENCOUNTER — Encounter: Payer: Self-pay | Admitting: Orthopedic Surgery

## 2021-08-15 DIAGNOSIS — G8929 Other chronic pain: Secondary | ICD-10-CM

## 2021-08-15 DIAGNOSIS — M545 Low back pain, unspecified: Secondary | ICD-10-CM

## 2021-08-15 DIAGNOSIS — M25511 Pain in right shoulder: Secondary | ICD-10-CM

## 2021-08-15 NOTE — Telephone Encounter (Signed)
Patient's wife left a Vm stating that he is unable to get the Trulicity 4.5 mg and ever since he was changed from the 1.5mg  his sugar has not be controlled. Can you call patient?

## 2021-08-15 NOTE — Telephone Encounter (Signed)
Referral placed for pt

## 2021-08-15 NOTE — Telephone Encounter (Signed)
Please send a referral to Dr Aline Brochure for shoulder and back pain

## 2021-08-16 ENCOUNTER — Other Ambulatory Visit: Payer: Self-pay

## 2021-08-16 MED ORDER — TRULICITY 4.5 MG/0.5ML ~~LOC~~ SOAJ: 4.5000 mg | SUBCUTANEOUS | 1 refills | Status: DC

## 2021-08-16 NOTE — Telephone Encounter (Signed)
I have not seen anything

## 2021-08-16 NOTE — Telephone Encounter (Signed)
Patient's wife called and said that he needs a PA on this now. Have you seen one come over?

## 2021-08-16 NOTE — Telephone Encounter (Signed)
I have received a PA for the Trulicity 4.5 mg Key: [id number] Waiting for response.

## 2021-08-16 NOTE — Telephone Encounter (Signed)
We can try increasing the Lantus a bit more to cover those high readings which is likely making him sleepy until we can find the Trulicity.  Increase to 50 units SQ nightly for now.  Have them look at mail order pharmacies and non-chain pharmacies, as they both tend to have more in stock than other chain pharmacies.

## 2021-08-16 NOTE — Telephone Encounter (Signed)
Called the patient and gave him the message. Patient stated that [facility] has the Trulicity 4.5 and I have sent a new prescription to them. Patient verbalized an understanding and will call if he has any other concerns.

## 2021-08-16 NOTE — Telephone Encounter (Signed)
Called the patient and they have not been able to find a pharmacy that has the Trulicity 4.5 in stock. I have advised for them to check with different pharmacies other than [facility].  Patients wife stated that Dr. Posey Pronto increased his Lantus to 40 units at bedtime and he is sleeping a lot and yesterday his blood sugar was 286 in the morning and 290 in the evening. Patient did not have another number to give and they could not find the previous paper with his readings on it.  Please advise.

## 2021-08-18 NOTE — Telephone Encounter (Signed)
Patient's wife called and said his sugar is in the 300's. She said she is giving him the lantus 50 units. The PA is still pending for his Trulicitiy. Please advise  Call back [phone]

## 2021-08-18 NOTE — Telephone Encounter (Signed)
Kim checked Cover my Meds when the patient's wife called. The PA is still pending.

## 2021-08-18 NOTE — Telephone Encounter (Signed)
Discussed with pt's wife that the PA is still pending, also to increase his lantus to 60 units per Whitney. Reminded her that he needs to stay away from sugary beverages, eat 3 meals, avoid snacking and try to incorporate exercise on a routine basis. Understanding voiced.

## 2021-08-18 NOTE — Telephone Encounter (Signed)
Joy, have we gotten word back about his PA for Trulicity?  We will need to increase his Lantus further to 60 units to help control those highs.  Remind the patient to avoid sugary beverages, eat 3 meals (avoiding snacks) and incorporate some exercise in his daily routine which will help with his glucose readings.

## 2021-08-22 ENCOUNTER — Other Ambulatory Visit: Payer: Self-pay | Admitting: Internal Medicine

## 2021-08-22 ENCOUNTER — Other Ambulatory Visit: Payer: Self-pay

## 2021-08-22 ENCOUNTER — Encounter: Payer: 59 | Attending: Nurse Practitioner | Admitting: Nutrition

## 2021-08-22 VITALS — Ht 68.0 in | Wt 224.0 lb

## 2021-08-22 DIAGNOSIS — E1165 Type 2 diabetes mellitus with hyperglycemia: Secondary | ICD-10-CM | POA: Diagnosis not present

## 2021-08-22 DIAGNOSIS — I10 Essential (primary) hypertension: Secondary | ICD-10-CM

## 2021-08-22 DIAGNOSIS — E782 Mixed hyperlipidemia: Secondary | ICD-10-CM

## 2021-08-22 DIAGNOSIS — G43819 Other migraine, intractable, without status migrainosus: Secondary | ICD-10-CM

## 2021-08-22 NOTE — Telephone Encounter (Signed)
Received a fax on the prior authorization today and faxed back to [facility] at [phone].

## 2021-08-22 NOTE — Telephone Encounter (Signed)
Called patients wife and let her know that I have faxed the paperwork for the prior authorization. Wife verbalized an understanding.

## 2021-08-22 NOTE — Telephone Encounter (Signed)
Pt wife is calling today for a follow up on this. [phone]

## 2021-08-22 NOTE — Progress Notes (Signed)
Medical Nutrition Therapy  Appointment Start time:  [phone]  Appointment End time:  1000  Primary concerns today: Type 2 DM  Referral diagnosis: E11.8 Preferred learning style: No preference. Learning readiness: Change in progress   NUTRITION ASSESSMENT  Type 2 DM. Dx 5+ yrs ago. Sees Rayetta Pigg, FNP at [facility]. Changes made since working with Whitney: watching what eating, avoiding processed foods. Got rid of sweet tea, processed foods and a lot of  milk. FBS: 70-100's. Anthropometrics  Wt Readings from Last 3 Encounters:  08/05/21 219 lb (99.3 kg)  07/25/21 221 lb 6.4 oz (100.4 kg)  04/04/21 213 lb 1.3 oz (96.7 kg)   Ht Readings from Last 3 Encounters:  08/05/21 5\' 8"  (1.727 m)  07/25/21 5\' 8"  (1.727 m)  04/04/21 5\' 8"  (1.727 m)   There is no height or weight on file to calculate BMI. @BMIFA @ Facility age limit for growth percentiles is 20 years. Facility age limit for growth percentiles is 20 years.    Clinical Medical Hx: see chart Medications: Lantus 40 units now. Had been up to 50 units and was having some low blood sugars. Had tried to get Trulicity but insuranace denied.  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 He does most of the cooking at home. Wife dieting right now. Has had some back issues.  Estimated daily fluid intake: 80 oz water Supplements: MVI Sleep: 7 hours Stress / self-care: none Current average weekly physical activity: working  24-Hr Dietary Recall First Meal: Bacon and eggs, water Snack:  Second  Meal: skips or ssandwich with 1 slice bread with honey ham, water Snack:  Third Meal: Fish or chicken,  greens, apples, water Snack:  Beverages: water   Eating more baked salmon, some red meat, Estimated Energy Needs Calories: 1800-2000 Carbohydrate: 200g Protein: 135g Fat: 50g   NUTRITION DIAGNOSIS  NB-1.1 Food and nutrition-related knowledge deficit As related to Diabetes Type 2.  As evidenced by A1C 9%.   NUTRITION INTERVENTION  Nutrition education (E-1) on the following topics:  Nutrition and Diabetes education provided on My Plate, CHO counting, meal planning, portion sizes, timing of meals, avoiding snacks between meals unless having a low blood sugar, target ranges for A1C and blood sugars, signs/symptoms and treatment of hyper/hypoglycemia, monitoring blood sugars, taking medications as prescribed, benefits of exercising 30 minutes per day and prevention of complications of DM. Lifestyle Medicine - Whole Food, Plant Predominant Nutrition is highly recommended: Eat Plenty of vegetables, Mushrooms, fruits, Legumes, Whole Grains, Nuts, seeds in lieu of processed meats, processed snacks/pastries red meat, poultry, eggs.    -It is better to avoid simple carbohydrates including: Cakes, Sweet Desserts, Ice Cream, Soda (diet and regular), Sweet Tea, Candies, Chips, Cookies, Store Bought Juices, Alcohol in Excess of  1-2 drinks a day, Lemonade,  Artificial Sweeteners, Doughnuts, Coffee Creamers, "Sugar-free" Products, etc, etc.  This is not a complete list.....  Exercise: If you are able: 30 -60 minutes a day ,4 days a week, or  150 minutes a week.  The longer the better.  Combine stretch, strength, and aerobic activities.  If you were told in the past that you have high risk for cardiovascular diseases, you may seek evaluation by your heart doctor prior to initiating moderate to intense exercise programs.  Handouts Provided Include  LIvestyle medicine plant based meal plan Meal Plan  Card Calorie Density of foods  Learning Style & Readiness for Change Teaching method utilized: Visual & Auditory  Demonstrated degree of understanding via: Teach Back  Barriers to learning/adherence to lifestyle change: none  Goals Established by Pt Goals  Eat three meals per day at times discussed Drink only water Focus on whole plant based foods Cut out processed foods Avoid snacks between meals. Take insulin and other medications as prescribed. Get A1C down to 7%     MONITORING & EVALUATION Dietary intake, weekly physical activity, and blood sugars  in 1 month.  Next Steps  Patient is to work on better eating habits and timing of meals.Marland Kitchen

## 2021-08-23 ENCOUNTER — Encounter: Payer: Self-pay | Admitting: Orthopaedic Surgery

## 2021-08-23 ENCOUNTER — Ambulatory Visit (INDEPENDENT_AMBULATORY_CARE_PROVIDER_SITE_OTHER): Payer: 59 | Admitting: Orthopaedic Surgery

## 2021-08-23 VITALS — BP 131/93 | HR 69 | Ht 68.0 in | Wt 223.0 lb

## 2021-08-23 DIAGNOSIS — M545 Low back pain, unspecified: Secondary | ICD-10-CM | POA: Diagnosis not present

## 2021-08-23 DIAGNOSIS — M5136 Other intervertebral disc degeneration, lumbar region: Secondary | ICD-10-CM

## 2021-08-23 DIAGNOSIS — M5432 Sciatica, left side: Secondary | ICD-10-CM

## 2021-08-23 MED ORDER — PREDNISONE 5 MG (21) PO TBPK: ORAL_TABLET | ORAL | 0 refills | Status: DC
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
## 2021-08-23 NOTE — Telephone Encounter (Signed)
Called patients wife to let her know that his Trulicity 4.5 mg has been Approved from 08/16/2021 to 08/16/2022.  Wife verbalized an understanding and thanked me. She is going to reach out to the pharmacy.

## 2021-08-23 NOTE — Patient Instructions (Addendum)
Physical therapy has been ordered for you at [facility]. They should call you to schedule, [phone] is the phone number to call, if you want to call to schedule.    Take the prednisone if you have a pinched nerve it will help temporarily. Do not take the Ibuprofen while you are on the prednisone    Back Exercises The following exercises strengthen the muscles that help to support the trunk (torso) and back. They also help to keep the lower back flexible. Doing these exercises can help to prevent or lessen existing low back pain. If you have back pain or discomfort, try doing these exercises 2-3 times each day or as told by your health care provider. As your pain improves, do them once each day, but increase the number of times that you repeat the steps for each exercise (do more repetitions). To prevent the recurrence of back pain, continue to do these exercises once each day or as told by your health care provider. Do exercises exactly as told by your health care provider and adjust them as directed. It is normal to feel mild stretching, pulling, tightness, or discomfort as you do these exercises, but you should stop right away if you feel sudden pain or your pain gets worse. Exercises Single knee to chest Repeat these steps 3-5 times for each leg: Lie on your back on a firm bed or the floor with your legs extended. Bring one knee to your chest. Your other leg should stay extended and in contact with the floor. Hold your knee in place by grabbing your knee or thigh with both hands and hold. Pull on your knee until you feel a gentle stretch in your lower back or buttocks. Hold the stretch for 10-30 seconds. Slowly release and straighten your leg.  Pelvic tilt Repeat these steps 5-10 times: Lie on your back on a firm bed or the floor with your legs extended. Bend your knees so they are pointing toward the ceiling and your feet are flat on the floor. Tighten your lower abdominal muscles to  press your lower back against the floor. This motion will tilt your pelvis so your tailbone points up toward the ceiling instead of pointing to your feet or the floor. With gentle tension and even breathing, hold this position for 5-10 seconds.  Cat-cow Repeat these steps until your lower back becomes more flexible: Get into a hands-and-knees position on a firm bed or the floor. Keep your hands under your shoulders, and keep your knees under your hips. You may place padding under your knees for comfort. Let your head hang down toward your chest. Contract your abdominal muscles and point your tailbone toward the floor so your lower back becomes rounded like the back of a cat. Hold this position for 5 seconds. Slowly lift your head, let your abdominal muscles relax, and point your tailbone up toward the ceiling so your back forms a sagging arch like the back of a cow. Hold this position for 5 seconds.  Press-ups Repeat these steps 5-10 times: Lie on your abdomen (face-down) on a firm bed or the floor. Place your palms near your head, about shoulder-width apart. Keeping your back as relaxed as possible and keeping your hips on the floor, slowly straighten your arms to raise the top half of your body and lift your shoulders. Do not use your back muscles to raise your upper torso. You may adjust the placement of your hands to make yourself more  comfortable. Hold this position for 5 seconds while you keep your back relaxed. Slowly return to lying flat on the floor.  Bridges Repeat these steps 10 times: Lie on your back on a firm bed or the floor. Bend your knees so they are pointing toward the ceiling and your feet are flat on the floor. Your arms should be flat at your sides, next to your body. Tighten your buttocks muscles and lift your buttocks off the floor until your waist is at almost the same height as your knees. You should feel the muscles working in your buttocks and the back of your  thighs. If you do not feel these muscles, slide your feet 1-2 inches (2.5-5 cm) farther away from your buttocks. Hold this position for 3-5 seconds. Slowly lower your hips to the starting position, and allow your buttocks muscles to relax completely. If this exercise is too easy, try doing it with your arms crossed over your chest. Abdominal crunches Repeat these steps 5-10 times: Lie on your back on a firm bed or the floor with your legs extended. Bend your knees so they are pointing toward the ceiling and your feet are flat on the floor. Cross your arms over your chest. Tip your chin slightly toward your chest without bending your neck. Tighten your abdominal muscles and slowly raise your torso high enough to lift your shoulder blades a tiny bit off the floor. Avoid raising your torso higher than that because it can put too much stress on your lower back and does not help to strengthen your abdominal muscles. Slowly return to your starting position.  Back lifts Repeat these steps 5-10 times: Lie on your abdomen (face-down) with your arms at your sides, and rest your forehead on the floor. Tighten the muscles in your legs and your buttocks. Slowly lift your chest off the floor while you keep your hips pressed to the floor. Keep the back of your head in line with the curve in your back. Your eyes should be looking at the floor. Hold this position for 3-5 seconds. Slowly return to your starting position.  Contact a health care provider if: Your back pain or discomfort gets much worse when you do an exercise. Your worsening back pain or discomfort does not lessen within 2 hours after you exercise. If you have any of these problems, stop doing these exercises right away. Do not do them again unless your health care provider says that you can. Get help right away if: You develop sudden, severe back pain. If this happens, stop doing the exercises right away. Do not do them again unless your  health care provider says that you can. This information is not intended to replace advice given to you by your health care provider. Make sure you discuss any questions you have with your health care provider. Document Revised: 12/21/2020 Document Reviewed: 09/08/2020 Elsevier Patient Education  2022 [employer].   When you return if you are not better we will order MRI scan

## 2021-08-30 ENCOUNTER — Ambulatory Visit: Payer: 59

## 2021-08-30 ENCOUNTER — Encounter: Payer: Self-pay | Admitting: Orthopedic Surgery

## 2021-08-30 ENCOUNTER — Other Ambulatory Visit: Payer: Self-pay

## 2021-08-30 ENCOUNTER — Ambulatory Visit: Payer: 59 | Admitting: Orthopedic Surgery

## 2021-08-30 VITALS — BP 133/91 | HR 83 | Ht 68.0 in | Wt 223.0 lb

## 2021-08-30 DIAGNOSIS — M25512 Pain in left shoulder: Secondary | ICD-10-CM

## 2021-08-30 DIAGNOSIS — G8929 Other chronic pain: Secondary | ICD-10-CM | POA: Diagnosis not present

## 2021-08-30 NOTE — Patient Instructions (Signed)
While we are working on your approval for MRI please go ahead and call to schedule your appointment with [facility] within at least one (1) week.   Central Scheduling [phone]  

## 2021-08-30 NOTE — Progress Notes (Signed)
Orthopaedic Clinic Return  Assessment: Travis Delgado is a 51 y.o. male with the following: Chronic left shoulder pain, impingement syndrome versus rotator cuff injury  Plan: Patient has bilateral shoulder pain, left is currently worse than right.  He has tried injections previously, without sustained relief.  He has worked with therapy, and tried some home exercises.  Nothing has provided sustained pain relief.  As a result, I recommended a left shoulder MRI.  He is in agreement with this plan.  Once the images are complete, we will see him in clinic to discuss the results.  He is aware that surgery may be a possibility, depending on the results of the MRI.  Follow-up: Return for After MRI.   Subjective:  Chief Complaint  Patient presents with   Shoulder Pain    LT// has been painful for a few years but now is getting worse. Wakes patient up at night    History of Present Illness: Travis Delgado is a 51 y.o. male who returns to clinic for repeat evaluation of left shoulder pain.  I saw him in clinic approximately 1 year ago for bilateral shoulder pain.  He has been working on home exercises, but states that he is unable to continue his usual activities.  He has tried medications without relief.  Injections in the past have not provided sustained relief.  Pain gets worse at night.  He actually has pain in both shoulders, but left is currently worse than right.  Occasionally has some radiating pains, primarily into his right hand.  Review of Systems: No fevers or chills No numbness or tingling No chest pain No shortness of breath No bowel or bladder dysfunction No GI distress No headaches   Objective: BP (!) 133/91    Pulse 83    Ht 5\' 8"  (1.727 m)    Wt 223 lb (101.2 kg)    BMI 33.91 kg/m   Physical Exam:  Evaluation left shoulder demonstrates no deformity.  He has mild tenderness to palpation within the trapezius muscles.  Forward flexion to 140 degrees with obvious  pain.  Internal rotation to his lumbar spine.  Limited abduction.  4+/5 strength in the infraspinatus.  Pain began testing position.  IMAGING: I personally ordered and reviewed the following images:  X-rays left shoulder were obtained in clinic today.  No acute injuries are noted.  Based on available imaging, there appears to be decreased space between the humeral head, and the undersurface of the acromion.  Possible sequelae of chronic impingement.  No proximal humeral migration.  Impression: Left shoulder x-ray, with mild degenerative changes of the [facility] joint, sequelae of chronic impingement syndrome   Mordecai Rasmussen, MD 08/30/2021 12:21 PM

## 2021-08-31 ENCOUNTER — Ambulatory Visit (HOSPITAL_COMMUNITY): Payer: 59 | Attending: Orthopaedic Surgery | Admitting: Physical Therapy

## 2021-08-31 ENCOUNTER — Encounter (HOSPITAL_COMMUNITY): Payer: Self-pay | Admitting: Physical Therapy

## 2021-08-31 DIAGNOSIS — M5136 Other intervertebral disc degeneration, lumbar region: Secondary | ICD-10-CM | POA: Diagnosis not present

## 2021-08-31 DIAGNOSIS — M545 Low back pain, unspecified: Secondary | ICD-10-CM | POA: Insufficient documentation
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
## 2021-09-06 ENCOUNTER — Ambulatory Visit: Payer: 59 | Admitting: Orthopaedic Surgery

## 2021-09-06 ENCOUNTER — Encounter (HOSPITAL_COMMUNITY): Payer: Self-pay

## 2021-09-06 ENCOUNTER — Other Ambulatory Visit: Payer: Self-pay

## 2021-09-06 ENCOUNTER — Ambulatory Visit (HOSPITAL_COMMUNITY): Payer: 59

## 2021-09-06 DIAGNOSIS — M545 Low back pain, unspecified: Secondary | ICD-10-CM | POA: Diagnosis not present
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
## 2021-09-06 NOTE — Patient Instructions (Addendum)
Bracing With Leg March (Hook-Lying)    With neutral spine, tighten pelvic floor and abdominals and hold.  Alternating legs, lift foot ___ inches and return to floor.  Repeat 10 times. Do 2 times a day.   Copyright  VHI. All rights reserved.   Bridging    Slowly raise buttocks from floor, keeping stomach tight. Repeat 10 times per set. Do 2 sets per session.   http://orth.exer.us/1096   Copyright  VHI. All rights reserved.

## 2021-09-07 ENCOUNTER — Encounter: Payer: Self-pay | Admitting: Nutrition

## 2021-09-07 NOTE — Patient Instructions (Addendum)
Goals  Eat three meals per day at times discussed Drink only water Focus on whole plant based foods Cut out processed foods Avoid snacks between meals. Take insulin and other medications as prescribed. Get A1C down to 7%  Lifestyle Medicine - Whole Food, Plant Predominant Nutrition is highly recommended: Eat Plenty of vegetables, Mushrooms, fruits, Legumes, Whole Grains, Nuts, seeds in lieu of processed meats, processed snacks/pastries red meat, poultry, eggs.    -It is better to avoid simple carbohydrates including: Cakes, Sweet Desserts, Ice Cream, Soda (diet and regular), Sweet Tea, Candies, Chips, Cookies, Store Bought Juices, Alcohol in Excess of  1-2 drinks a day, Lemonade,  Artificial Sweeteners, Doughnuts, Coffee Creamers, "Sugar-free" Products, etc, etc.  This is not a complete list.....  Exercise: If you are able: 30 -60 minutes a day ,4 days a week, or 150 minutes a week.  The longer the better.  Combine stretch, strength, and aerobic activities.  If you were told in the past that you have high risk for cardiovascular diseases, you may seek evaluation by your heart doctor prior to initiating moderate to intense exercise programs.

## 2021-09-08 ENCOUNTER — Ambulatory Visit: Payer: 59 | Admitting: Orthopaedic Surgery

## 2021-09-13 ENCOUNTER — Encounter (HOSPITAL_COMMUNITY): Payer: Self-pay

## 2021-09-13 ENCOUNTER — Other Ambulatory Visit: Payer: Self-pay

## 2021-09-13 ENCOUNTER — Ambulatory Visit (HOSPITAL_COMMUNITY): Payer: 59 | Attending: Orthopaedic Surgery

## 2021-09-13 DIAGNOSIS — M545 Low back pain, unspecified: Secondary | ICD-10-CM | POA: Insufficient documentation
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
## 2021-09-15 ENCOUNTER — Ambulatory Visit: Payer: 59 | Admitting: Orthopaedic Surgery

## 2021-09-15 ENCOUNTER — Ambulatory Visit (HOSPITAL_COMMUNITY)
Admission: RE | Admit: 2021-09-15 | Discharge: 2021-09-15 | Disposition: A | Discharge status: Home / Self Care | Payer: 59 | Source: Ambulatory Visit | Attending: Orthopedic Surgery | Admitting: Orthopedic Surgery

## 2021-09-15 ENCOUNTER — Other Ambulatory Visit: Payer: Self-pay

## 2021-09-15 DIAGNOSIS — M25512 Pain in left shoulder: Secondary | ICD-10-CM | POA: Insufficient documentation

## 2021-09-15 DIAGNOSIS — G8929 Other chronic pain: Secondary | ICD-10-CM | POA: Insufficient documentation

## 2021-09-15 IMAGING — MR MR SHOULDER*L* W/O CM
5 series · 40 of 40 positions shown · non-contrast
Comparison: X-ray shoulder [DATE].

CLINICAL DATA: Chronic left shoulder pain with limited range of
motion. Clinical concern for rotator cuff disorder.

EXAM:
MRI OF THE LEFT SHOULDER WITHOUT CONTRAST
TECHNIQUE: Multiplanar, multisequence MR imaging of the shoulder was performed.
No intravenous contrast was administered.

[Series 2: T2 fat-sat · axial · left · 4.0mm · 0.49mm/px · z∈[-80,+9]mm · 8 of 20 slices shown (1 of 3)]
[im 1/20]
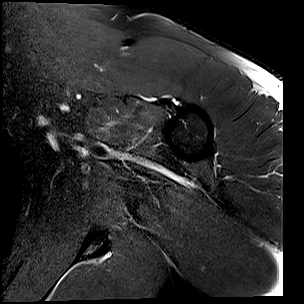
[im 3/20]
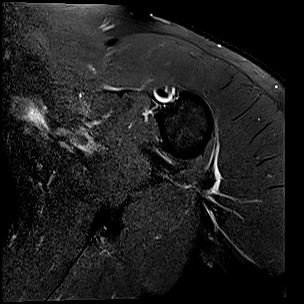
[im 6/20]
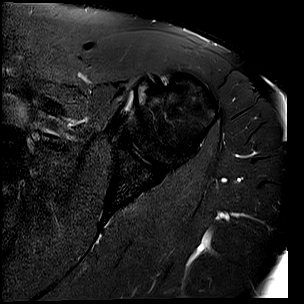
[im 9/20]
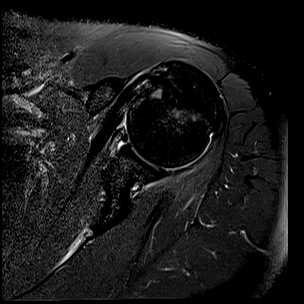
[im 11/20]
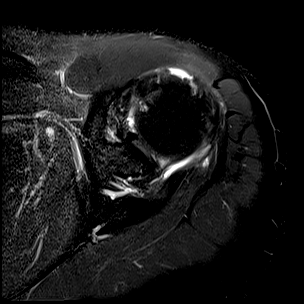
[im 14/20]
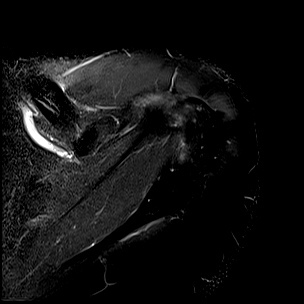
[im 17/20]
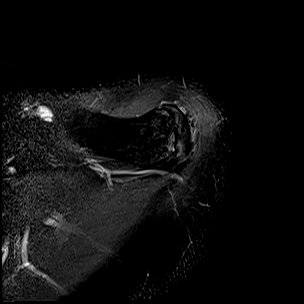
[im 20/20]
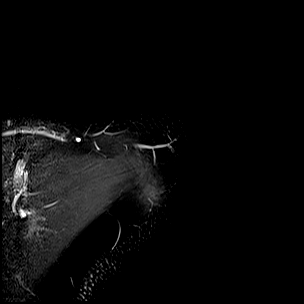

[Series 3: T1 · oblique · left · 4.0mm · 0.41mm/px · 8 of 19 slices shown]
[im 1/19]
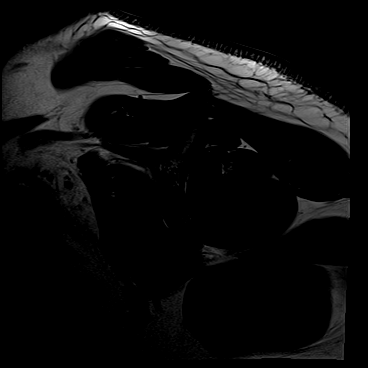
[im 3/19]
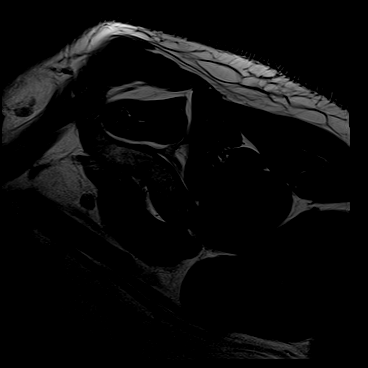
[im 6/19]
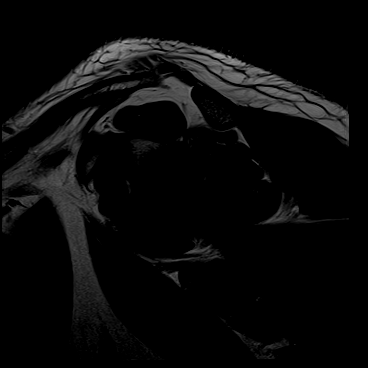
[im 8/19]
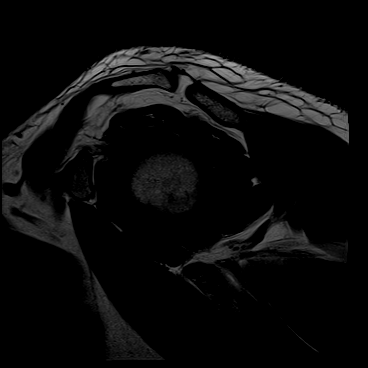
[im 11/19]
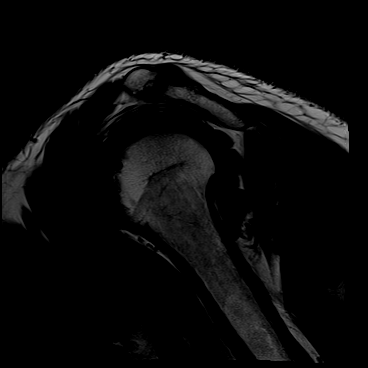
[im 13/19]
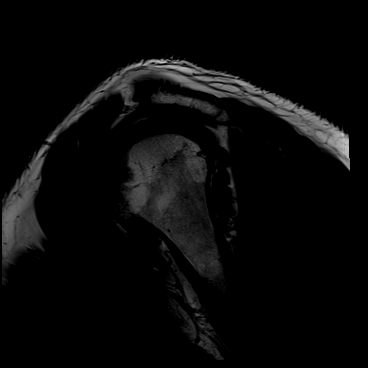
[im 16/19]
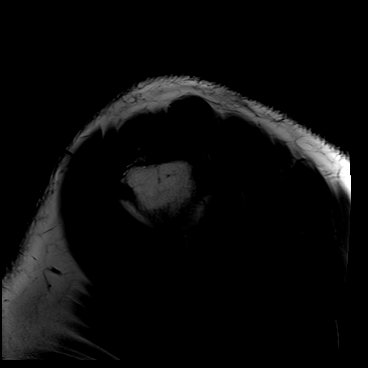
[im 19/19]
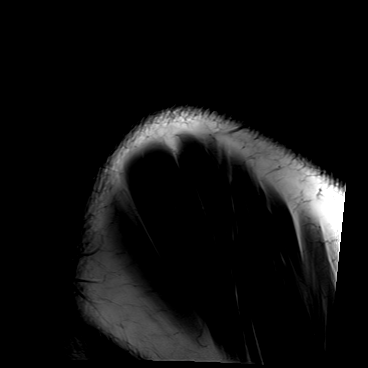

[Series 7: T2 fat-sat · oblique · left · 4.0mm · 0.47mm/px · 8 of 19 slices shown (2 of 3)]
[im 1/19]
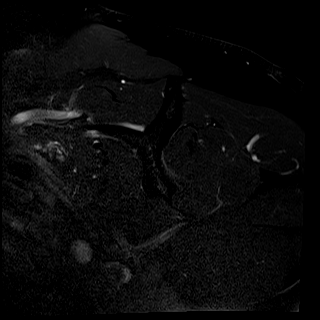
[im 3/19]
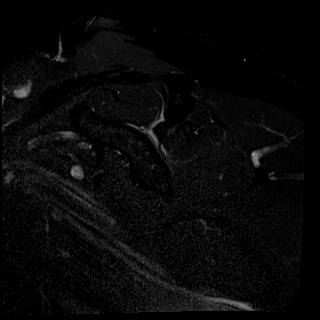
[im 6/19]
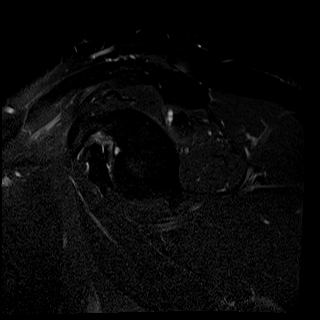
[im 8/19]
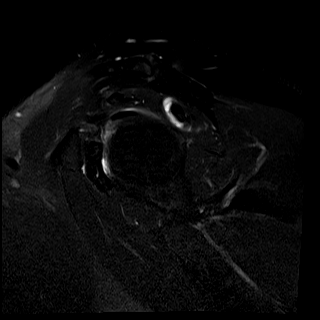
[im 11/19]
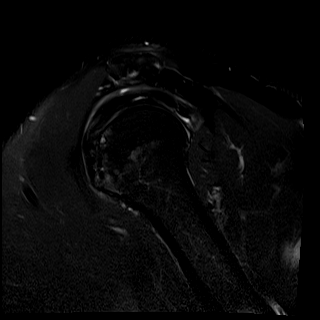
[im 13/19]
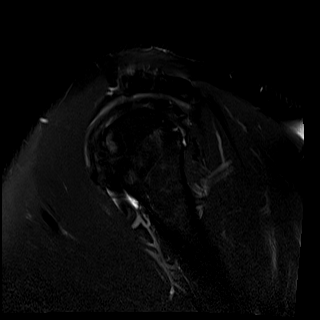
[im 16/19]
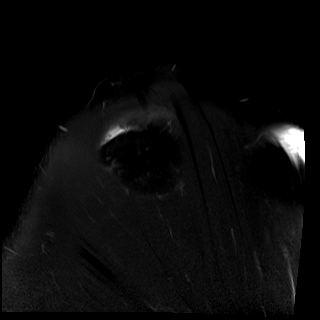
[im 19/19]
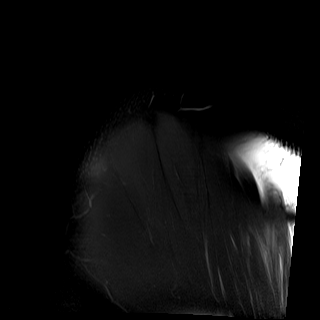

[Series 1009: T2 fat-sat · oblique · left · 4.0mm · 0.47mm/px · 8 of 19 slices shown (3 of 3)]
[im 1/19]
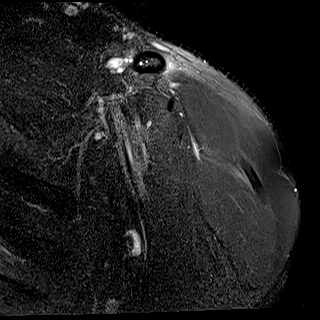
[im 3/19]
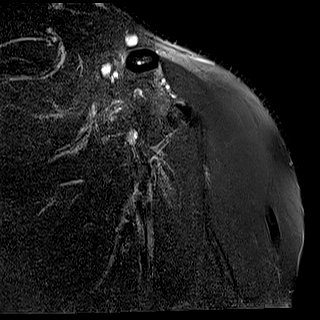
[im 6/19]
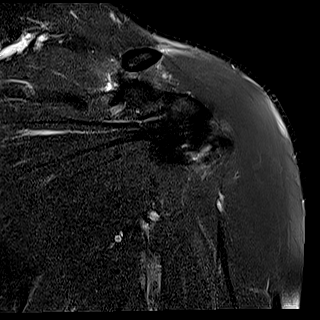
[im 8/19]
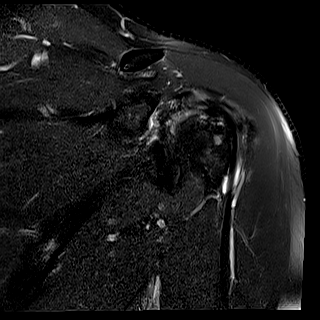
[im 11/19]
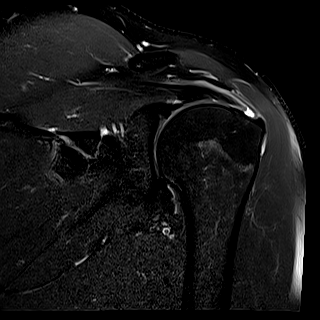
[im 13/19]
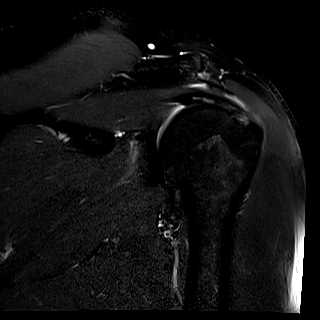
[im 16/19]
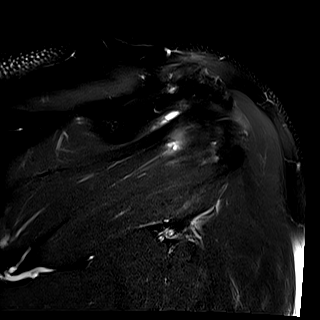
[im 19/19]
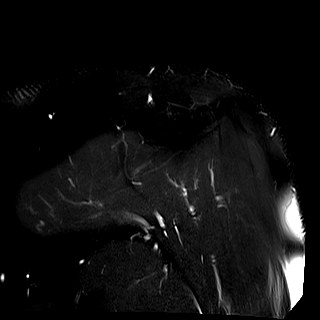

[Series 1011: PD · oblique · left · 4.0mm · 0.43mm/px · 8 of 19 slices shown]
[im 1/19]
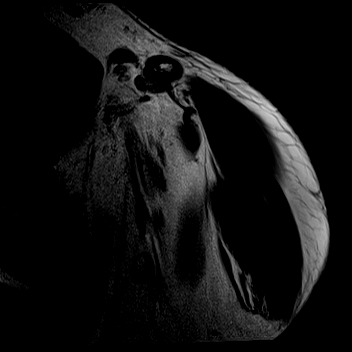
[im 3/19]
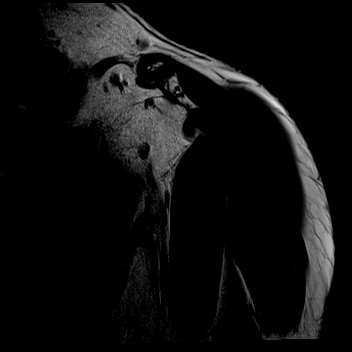
[im 6/19]
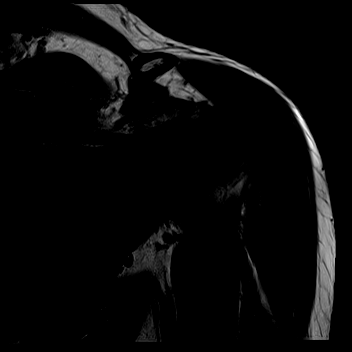
[im 8/19]
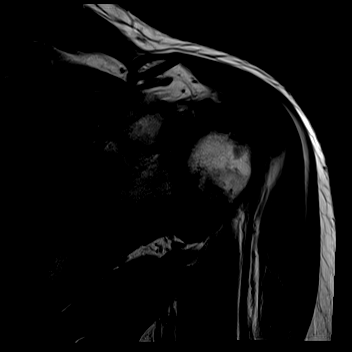
[im 11/19]
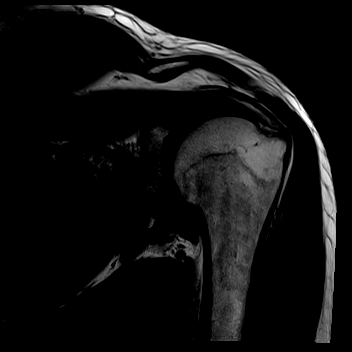
[im 13/19]
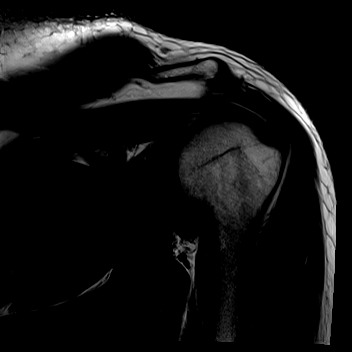
[im 16/19]
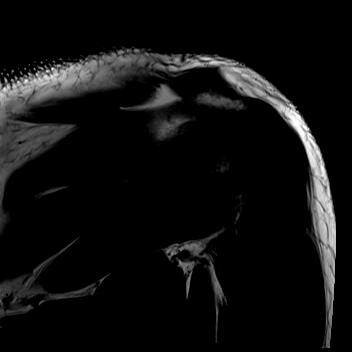
[im 19/19]
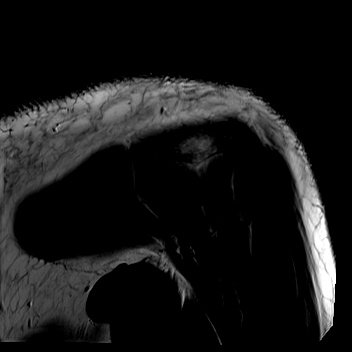

[40 of 40 positions shown; findings below may reference images not displayed]

FINDINGS: Rotator cuff: Moderate tendinosis of the supraspinatus tendon with a
10 mm full-thickness tear anteriorly and 5 mm of retraction. Mild
tendinosis of the infraspinatus tendon. Teres minor tendon is
intact. Moderate tendinosis of the subscapularis tendon.

Muscles: No muscle atrophy or edema. Small ganglion cyst at the
musculotendinous junction of the infraspinatus. No intramuscular
fluid collection or hematoma.

Biceps Long Head: Moderate tendinosis of the intra-articular portion
of the long head of the biceps tendon.

Acromioclavicular Joint: Mild arthropathy of the acromioclavicular
joint. No subacromial/subdeltoid bursal fluid.

Glenohumeral Joint: No joint effusion. No chondral defect.

Labrum: Grossly intact, but evaluation is limited by lack of
intraarticular fluid/contrast.

Bones: No fracture or dislocation. No aggressive osseous lesion.

Other: No fluid collection or hematoma.
IMPRESSION: 1. Moderate tendinosis of the supraspinatus tendon with a 10 mm
full-thickness tear anteriorly and 5 mm of retraction.
2. Mild tendinosis of the infraspinatus tendon.
3. Moderate tendinosis of the subscapularis tendon.
4. Moderate tendinosis of the intra-articular portion of the long
head of the biceps tendon.

## 2021-09-19 ENCOUNTER — Ambulatory Visit: Payer: 59 | Admitting: Nutrition

## 2021-09-20 ENCOUNTER — Encounter (HOSPITAL_COMMUNITY): Payer: Self-pay | Admitting: Physical Therapy

## 2021-09-20 ENCOUNTER — Ambulatory Visit (HOSPITAL_COMMUNITY): Payer: 59 | Admitting: Physical Therapy

## 2021-09-20 ENCOUNTER — Ambulatory Visit (INDEPENDENT_AMBULATORY_CARE_PROVIDER_SITE_OTHER): Payer: 59 | Admitting: Orthopaedic Surgery

## 2021-09-20 ENCOUNTER — Other Ambulatory Visit: Payer: Self-pay

## 2021-09-20 ENCOUNTER — Encounter: Payer: Self-pay | Admitting: Orthopaedic Surgery

## 2021-09-20 DIAGNOSIS — G8929 Other chronic pain: Secondary | ICD-10-CM

## 2021-09-20 DIAGNOSIS — M545 Low back pain, unspecified: Secondary | ICD-10-CM | POA: Diagnosis not present

## 2021-09-20 DIAGNOSIS — M25512 Pain in left shoulder: Secondary | ICD-10-CM

## 2021-09-20 DIAGNOSIS — M5432 Sciatica, left side: Secondary | ICD-10-CM | POA: Diagnosis not present

## 2021-09-20 NOTE — Progress Notes (Signed)
My shoulder and back hurt. ? ?He had recent MRI of the left shoulder showing: ?IMPRESSION: ?1. Moderate tendinosis of the supraspinatus tendon with a 10 mm ?full-thickness tear anteriorly and 5 mm of retraction. ?2. Mild tendinosis of the infraspinatus tendon. ?3. Moderate tendinosis of the subscapularis tendon. ?4. Moderate tendinosis of the intra-articular portion of the long ?head of the biceps tendon. ? ?He is to see Dr. Amedeo Kinsman for this. ? ?He also has lower back pain.  He has been to PT with little help.  He has more pain.  He is not improving.  I will get MRI of the lumbar spine. ? ?Spine/Pelvis examination: ? Inspection:  Overall, sacoiliac joint benign and hips nontender; without crepitus or defects. ? ? Thoracic spine inspection: Alignment normal without kyphosis present ? ? Lumbar spine inspection:  Alignment  with normal lumbar lordosis, without scoliosis apparent. ? ? Thoracic spine palpation:  without tenderness of spinal processes ? ? Lumbar spine palpation: with tenderness of lumbar area; with tightness of lumbar muscles  ? ? Range of Motion: ?  Lumbar flexion, forward flexion is 5 with pain or tenderness  ?  Lumbar extension is 5 with pain or tenderness ?  Left lateral bend is Normal  with pain or tenderness ?  Right lateral bend is Normal with pain or tenderness ?  Straight leg raising is Abnormal- 30 degrees  left ? ? Strength & tone: Normal ? ? Stability overall normal stability ?   ?Encounter Diagnoses  ?Name Primary?  ? Left sided sciatica Yes  ? Chronic left shoulder pain   ? ?To see Dr. Amedeo Kinsman for his shoulder. ? ?To get MRI of the lumbar spine.  I am concerned about HNP.  He has not improved with PT, medicine, home exercises. ? ?Return in two weeks. ? ?Call if any problem. ? ?Precautions discussed. ? ?Electronically Signed ?Sanjuana Kava, MD ?3/14/202310:38 AM ? ?
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
## 2021-09-20 NOTE — Patient Instructions (Signed)
Access Code: Delrae Sawyers ?URL: [email or web address] ?Date: 09/20/2021 ?Prepared by: Mitzi Hansen Marlene Pfluger ? ?Exercises ?Prone Press Up - 3 x daily - 7 x weekly - 2 sets - 10 reps - 2 second hold ? ?

## 2021-09-20 NOTE — Patient Instructions (Addendum)
Return to clinic to see Dr. Amedeo Kinsman for left shoulder. ?

## 2021-09-22 ENCOUNTER — Other Ambulatory Visit: Payer: Self-pay

## 2021-09-22 ENCOUNTER — Ambulatory Visit: Payer: 59 | Admitting: Orthopaedic Surgery

## 2021-09-22 ENCOUNTER — Telehealth: Payer: Self-pay | Admitting: Nurse Practitioner

## 2021-09-22 MED ORDER — TRULICITY 3 MG/0.5ML ~~LOC~~ SOAJ: 3.0000 mg | SUBCUTANEOUS | 1 refills | Status: DC

## 2021-09-22 NOTE — Telephone Encounter (Signed)
Yes, it is possible that the increased dose of Trulicity may be contributing to his symptoms.  Symptoms can be worse if he eats a high fat meal or a large quantity.  These symptoms typically get better over time.  However, if he doesn't feel like it is improving or that it may be due to what he has been eating lately, we can certainly lower him back to the 3 mg and see how he does.

## 2021-09-22 NOTE — Telephone Encounter (Signed)
Pt called requesting your call back. [phone] ?

## 2021-09-22 NOTE — Telephone Encounter (Signed)
Called the patient and gave him the message and he stated that he has not eaten any large meals but he has eaten some fried foods and he stated that his stomach bothers him no matter what he eats such as a handful of grapes. I let the patient know that I am sending the Trulicity '3mg'$  to his [place] and to take when his next dose is due. Patient verbalized an understanding. ?

## 2021-09-22 NOTE — Telephone Encounter (Signed)
Called patient and he stated that since he has been taking the Trulicity 4.5 mg his stomach has really been messed up and he is having diarrhea, stomach cramps, gas, and bad discomfort. Patient stated that he did not have these symptoms on the Trulicity 3 mg and wanted to know if he needs to go back to the Trulicity 3 mg dose? ?

## 2021-09-27 ENCOUNTER — Encounter (HOSPITAL_COMMUNITY): Payer: Self-pay | Admitting: Physical Therapy

## 2021-09-27 ENCOUNTER — Encounter: Payer: Self-pay | Admitting: Orthopedic Surgery

## 2021-09-27 ENCOUNTER — Ambulatory Visit (HOSPITAL_COMMUNITY): Payer: 59 | Admitting: Physical Therapy

## 2021-09-27 ENCOUNTER — Other Ambulatory Visit: Payer: Self-pay

## 2021-09-27 ENCOUNTER — Ambulatory Visit: Payer: 59 | Admitting: Orthopedic Surgery

## 2021-09-27 VITALS — BP 150/89 | HR 90 | Ht 68.0 in | Wt 223.0 lb

## 2021-09-27 DIAGNOSIS — M75122 Complete rotator cuff tear or rupture of left shoulder, not specified as traumatic: Secondary | ICD-10-CM

## 2021-09-27 DIAGNOSIS — M545 Low back pain, unspecified: Secondary | ICD-10-CM | POA: Diagnosis not present

## 2021-09-27 NOTE — Progress Notes (Signed)
?  09/27/21 0851  ?PT LONG TERM GOAL #1  ?Title Patient will be independent with advanced HEP and self-management strategies to improve functional outcomes  ?Baseline reviewed and answered all patient questions  ?Time 4  ?Period Weeks  ?Status Achieved  ?Target Date 09/28/21  ?PT LONG TERM GOAL #2  ?Title Patient will improve FOTO score to predicted value to indicate improvement in functional outcomes  ?Baseline See FOTO  ?Time 4  ?Period Weeks  ?Status Partially Met  ?Target Date 09/28/21  ?PT LONG TERM GOAL #3  ?Title Patient will improve lumbar AROM by 50% in all restricted planes for improved ability to perform functional mobility tasks and ADLs.  ?Baseline See Lumbar ROM  ?Time 4  ?Period Weeks  ?Status Not Met  ?Target Date 09/28/21  ?PT LONG TERM GOAL #4  ?Title Patient will have  5/5 MMT throughout BLE to improve ability to perform functional mobility, stair ambulation and ADLs.  ?Baseline See MMT  ?Time 4  ?Period Weeks  ?Status Partially Met  ?Target Date 09/28/21  ? ? ?

## 2021-09-27 NOTE — Progress Notes (Signed)
Orthopaedic Clinic Return ? ?Assessment: ?Travis Delgado is a 51 y.o. male with the following: ?Left shoulder pain, full-thickness rotator cuff tear, with moderate tendinosis of the long head of the biceps tendon ? ?Plan: ?Travis Delgado continues to have pain in the anterior left shoulder, with decreased function.  MRI demonstrates full-thickness tearing, with small amount of retraction.  There is also obvious irritation of the long head of the biceps tendon.  Given the chronicity, and the severity of his symptoms, he is interested in surgery.  This was discussed briefly today.  He is currently undergoing a work-up for his lumbar spine.  In addition, he is a diabetic, with most recent A1c of 9.0.  This is vastly improved from August, 2022.  Nonetheless, I would like to obtain medical clearance prior to proceeding with surgery.  I will reach out to Dr. Posey Pronto directly to ensure that Travis Delgado is able to proceed with surgery.  In addition, he is scheduled to undergo an MRI of his lumbar spine in 2-3 weeks, and then follow-up with Dr. Luna Glasgow.  I do not want to interfere with his work-up.  Once clearance has been obtained, we will see him in clinic to discuss the neck step in management of his left shoulder pain. ? ?Follow-up: ?Return for After medical clearance for OR. ? ? ?Subjective: ? ?Chief Complaint  ?Patient presents with  ? Surgical Consultation  ?  LT shoulder/Dr.Keeling referred  ? ? ?History of Present Illness: ?Travis Delgado is a 51 y.o. male who returns to clinic for repeat evaluation of left shoulder pain.  He continues to have pain in the anterior aspect of the left shoulder.  His pain is reasonable when he is not using his arm.  However, when he is active, his pain worsens.  He has difficulty with overhead motion.  Pain gets worse at night.  He has obtained an MRI, and is here to discuss the results.  He is unable to work in his current state.  He is also seeing Dr. Luna Glasgow for lower back  pain. ? ?Review of Systems: ?No fevers or chills ?No numbness or tingling ?No chest pain ?No shortness of breath ?No bowel or bladder dysfunction ?No GI distress ?No headaches ? ? ?Objective: ?BP (!) 150/89   Pulse 90   Ht '5\' 8"'$  (1.727 m)   Wt 223 lb (101.2 kg)   BMI 33.91 kg/m?  ? ?Physical Exam: ? ?Alert and oriented.  No acute distress. ? ?Evaluation left shoulder demonstrates no deformity.  Forward flexion to 130 degrees with obvious discomfort.  Internal rotation to his back pocket.  Pain in the empty can testing position.  Supraspinatus strength is 4/5.  Positive O'Brien's.  Tenderness to palpation of the anterior shoulder.  Fingers are warm and well-perfused.  2+ radial pulse. ? ?IMAGING: ?I personally ordered and reviewed the following images: ? ?Left shoulder MRI ? ?IMPRESSION: ?1. Moderate tendinosis of the supraspinatus tendon with a 10 mm ?full-thickness tear anteriorly and 5 mm of retraction. ?2. Mild tendinosis of the infraspinatus tendon. ?3. Moderate tendinosis of the subscapularis tendon. ?4. Moderate tendinosis of the intra-articular portion of the long ?head of the biceps tendon. ? ?Mordecai Rasmussen, MD ?09/27/2021 ?1:51 PM ? ? ?
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
## 2021-09-29 ENCOUNTER — Encounter: Payer: Self-pay | Admitting: Nutrition

## 2021-09-29 ENCOUNTER — Encounter: Payer: 59 | Attending: "Endocrinology | Admitting: Nutrition

## 2021-09-29 ENCOUNTER — Other Ambulatory Visit: Payer: Self-pay

## 2021-09-29 VITALS — Ht 68.0 in | Wt 218.0 lb

## 2021-09-29 DIAGNOSIS — E1165 Type 2 diabetes mellitus with hyperglycemia: Secondary | ICD-10-CM | POA: Diagnosis not present

## 2021-09-29 DIAGNOSIS — I10 Essential (primary) hypertension: Secondary | ICD-10-CM | POA: Diagnosis present

## 2021-09-29 DIAGNOSIS — E782 Mixed hyperlipidemia: Secondary | ICD-10-CM | POA: Diagnosis present

## 2021-09-29 NOTE — Progress Notes (Signed)
Medical Nutrition Therapy  ?Appointment Start time:  [phone]  Appointment End time:  1000 ? ?Primary concerns today: Type 2 DM  ?Referral diagnosis: E11.8 ?Preferred learning style: No preference. ?Learning readiness: Change in progress ? ? ?NUTRITION ASSESSMENT  ?Type 2 DM. Dx 5+ yrs ago. ?Sees Rayetta Pigg, FNP at [facility].  ?FBS:120-150's  Bedtime: 150's. ?Changes: drinking more water, working on cutting out the fried foods. ?Still needs to work on getting his meal time schedule. Going to therapy an back. Scheduled for MRI next week. ? ?Has skipped some meals and has been restricting carbs too much at times and this makes him feel weak and have headaches. ? ?AVG appear to be around 150's according to his report. He didn't bring meter this am because he was running late. ? ?Motivated to keep making changes. ?Only taking 3 mg of Trulicity due to 4.5 mg making him sick. ?Lantus 30 units daily. ? ?He notes his BP has been up a little  high at home 150/80's at home.  ?Advised for him to call his PCP and discuss readings for possible medications needs or recommendations. He hasn't been eating a lot of salt or processed foods. ? ?Anthropometrics  ?Wt Readings from Last 3 Encounters:  ?09/27/21 223 lb (101.2 kg)  ?08/30/21 223 lb (101.2 kg)  ?08/23/21 223 lb (101.2 kg)  ? ?Ht Readings from Last 3 Encounters:  ?09/27/21 '5\' 8"'$  (1.727 m)  ?08/30/21 '5\' 8"'$  (1.727 m)  ?08/23/21 '5\' 8"'$  (1.727 m)  ? ?There is no height or weight on file to calculate BMI. ?'@BMIFA'$ @ ?Facility age limit for growth percentiles is 20 years. ?Facility age limit for growth percentiles is 20 years. ?  ? ?Clinical ?Medical Hx: see chart ?Medications: Lantus 30 units now. Had been up to 50 units and was having some low blood sugars. Had tried to get Trulicity but insuranace denied. ? ?Labs:  ?Lab Results  ?Component Value Date  ? HGBA1C 9.0 (A) 07/25/2021  ? ?Lipid Panel  ?   ?Component Value Date/Time  ? CHOL 230 (H) 03/04/2021 0947  ? TRIG  127 03/04/2021 0947  ? HDL 35 (L) 03/04/2021 0947  ? CHOLHDL 5.9 (H) 07/28/2019 1030  ? VLDL 21 08/27/2014 0852  ? [place] 172 (H) 03/04/2021 0947  ? LDLCALC 164 (H) 07/28/2019 1030  ? LABVLDL 23 03/04/2021 0947  ? ? ?Notable Signs/Symptoms: None ? 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 ?He does most of the cooking at home. ?Wife dieting right now. ?Has had some back issues. ? ?Estimated daily fluid intake: 80 oz water ?Supplements: MVI ?Sleep: 7 hours ?Stress / self-care: none ?Current average weekly physical activity: working ? ?24-Hr Dietary Recall ?First Meal: Skipped:   ?Snack:  ?Second Meal: PB sandwich with one piece of bread  or left overs. ?Snack:  ?Third Meal: Fish or chicken,  greens, apples, water ?Snack:  ?Beverages: water  ? ?Eating more baked salmon, some red meat, ?Estimated Energy Needs ?Calories: 1800-2000 ?Carbohydrate: 200g ?Protein: 135g ?Fat: 50g ? ? ?NUTRITION DIAGNOSIS  ?NB-1.1 Food and nutrition-related knowledge deficit As related to Diabetes Type 2.  As evidenced by A1C 9%. ? ? ?NUTRITION INTERVENTION  ?Nutrition education (E-1) on the following topics:  ?Nutrition and Diabetes education provided on My Plate, CHO counting, meal planning, portion sizes, timing of meals, avoiding snacks between meals unless having a low blood sugar, target ranges for A1C and blood sugars, signs/symptoms and treatment of hyper/hypoglycemia, monitoring blood sugars, taking medications as  prescribed, benefits of exercising 30 minutes per day and prevention of complications of DM. ?Lifestyle Medicine ?- Whole Food, Plant Predominant Nutrition is highly recommended: Eat Plenty of vegetables, Mushrooms, fruits, Legumes, Whole Grains, Nuts, seeds in lieu of processed meats, processed snacks/pastries red meat, poultry, eggs.  ?  ?-It is better to avoid simple carbohydrates including: Cakes, Sweet Desserts, Ice Cream, Soda (diet and regular), Sweet Tea, Candies, Chips, Cookies, Store  Bought Juices, Alcohol in Excess of  1-2 drinks a day, Lemonade,  Artificial Sweeteners, Doughnuts, Coffee Creamers, "Sugar-free" Products, etc, etc.  This is not a complete list..... ? ?Exercise: If you are able: 30 -60 minutes a day ,4 days a week, or 150 minutes a week.  The longer the better.  Combine stretch, strength, and aerobic activities.  If you were told in the past that you have high risk for cardiovascular diseases, you may seek evaluation by your heart doctor prior to initiating moderate to intense exercise programs. ? ?Handouts Provided Include  ?LIvestyle medicine plant based meal plan ?Meal Plan Card ?Calorie Density of foods ? ?Learning Style & Readiness for Change ?Teaching method utilized: Visual & Auditory  ?Demonstrated degree of understanding via: Teach Back  ?Barriers to learning/adherence to lifestyle change: none ? ?Goals Established by Pt ?Goals ? ?Eat 3 meals per day ?Don't skip meals ?Eat 45-60 g Carbs per meal ? ? ? ? ?MONITORING & EVALUATION ?Dietary intake, weekly physical activity, and blood sugars  in 1 month. ? ?Next Steps  ?Patient is to work on better eating habits and timing of meals.. ? ?

## 2021-09-29 NOTE — Patient Instructions (Signed)
Goals Established by Pt ?Goals ? ?Eat 3 meals per day ?Don't skip meals ?Eat 45-60 g Carbs per meal ?Get A1C down to 7% ? ? Smart phrases: ?Lifestyle Medicine ?- Whole Food, Plant Predominant Nutrition is highly recommended: Eat Plenty of vegetables, Mushrooms, fruits, Legumes, Whole Grains, Nuts, seeds in lieu of processed meats, processed snacks/pastries red meat, poultry, eggs.  ?  ?-It is better to avoid simple carbohydrates including: Cakes, Sweet Desserts, Ice Cream, Soda (diet and regular), Sweet Tea, Candies, Chips, Cookies, Store Bought Juices, Alcohol in Excess of  1-2 drinks a day, Lemonade,  Artificial Sweeteners, Doughnuts, Coffee Creamers, "Sugar-free" Products, etc, etc.  This is not a complete list..... ? ?Exercise: If you are able: 30 -60 minutes a day ,4 days a week, or 150 minutes a week.  The longer the better.  Combine stretch, strength, and aerobic activities.  If you were told in the past that you have high risk for cardiovascular diseases, you may seek evaluation by your heart doctor prior to initiating moderate to intense exercise programs. ? ?The following Lifestyle Medicine recommendations according to [place]  [facility]) were discussed and and offered to patient and she  agrees to start the journey:  ?A. Whole Foods, Plant-Based Nutrition comprising of fruits and vegetables, plant-based proteins, whole-grain carbohydrates was discussed in detail with the patient.   A list for source of those nutrients were also provided to the patient.  Patient will use only water or unsweetened tea for hydration. ?B.  The need to stay away from risky substances including alcohol, smoking; obtaining 7 to 9 hours of restorative sleep, at least 150 minutes of moderate intensity exercise weekly, the importance of healthy social connections,  and stress management techniques were discussed. ?C.  A full color page of  Calorie density of various food groups per pound showing examples  of each food groups was provided to the patient. ?  ?

## 2021-10-04 ENCOUNTER — Encounter (HOSPITAL_COMMUNITY): Payer: 59

## 2021-10-10 ENCOUNTER — Ambulatory Visit (INDEPENDENT_AMBULATORY_CARE_PROVIDER_SITE_OTHER): Payer: 59 | Admitting: *Deleted

## 2021-10-10 VITALS — Ht 68.0 in | Wt 219.0 lb

## 2021-10-10 DIAGNOSIS — Z1211 Encounter for screening for malignant neoplasm of colon: Secondary | ICD-10-CM

## 2021-10-10 NOTE — Progress Notes (Addendum)
Gastroenterology Pre-Procedure Review ? ?Request Date: 10/10/2021 ?Requesting Physician: Dr. Ihor Dow @ [facility], no previous TCS ? ?PATIENT REVIEW QUESTIONS: The patient responded to the following health history questions as indicated:   ? ?1. Diabetes Melitis: yes, type II ?2. Joint replacements in the past 12 months: no ?3. Major health problems in the past 3 months: no ?4. Has an artificial valve or MVP: no ?5. Has a defibrillator: no ?6. Has been advised in past to take antibiotics in advance of a procedure like teeth cleaning: no ?7. Family history of colon cancer: no  ?8. Alcohol Use: no ?9. Illicit drug Use: no ?10. History of sleep apnea: no  ?11. History of coronary artery or other vascular stents placed within the last 12 months: no ?12. History of any prior anesthesia complications: no ?13. Body mass index is 33.3 kg/m?. ?   ?MEDICATIONS & ALLERGIES:    ?Patient reports the following regarding taking any blood thinners:   ?Plavix? no ?Aspirin? no ?Coumadin? no ?Brilinta? no ?Xarelto? no ?Eliquis? no ?Pradaxa? no ?Savaysa? no ?Effient? no ? ?Patient confirms/reports the following medications:  ?Current Outpatient Medications  ?Medication Sig Dispense Refill  ? atorvastatin (LIPITOR) 40 MG tablet Take 1 tablet (40 mg total) by mouth daily. 90 tablet 3  ? cyclobenzaprine (FLEXERIL) 5 MG tablet TAKE 1 TABLET(5 MG) BY MOUTH THREE TIMES DAILY AS NEEDED FOR MUSCLE SPASMS (Patient taking differently: as needed.) 30 tablet 1  ? Dulaglutide (TRULICITY) 3 TG/2.5WL SOPN Inject 3 mg into the skin once a week. 6 mL 1  ? ibuprofen (ADVIL) 600 MG tablet Take 1 tablet (600 mg total) by mouth every 8 (eight) hours as needed. (Patient taking differently: Take 600 mg by mouth as needed.) 30 tablet 0  ? insulin glargine (LANTUS SOLOSTAR) 100 UNIT/ML Solostar Pen Inject 30 Units into the skin at bedtime. 15 mL   ? Multiple Vitamins-Minerals (MEGA MULTIVITAMIN FOR MEN PO) Take 1 tablet by mouth daily.    ?  ondansetron (ZOFRAN) 4 MG tablet Take 1 tablet (4 mg total) by mouth every 6 (six) hours. (Patient taking differently: Take 4 mg by mouth as needed.) 12 tablet 0  ? rizatriptan (MAXALT-MLT) 10 MG disintegrating tablet DISSOLVE 1 TABLET ON THE TONGUE EVERY DAY AS NEEDED FOR MIGRAINE. MAY REPEAT IN 2 HOURS AS NEEDED (Patient taking differently: as needed.) 10 tablet 0  ? ?No current facility-administered medications for this visit.  ? ? ?Patient confirms/reports the following allergies:  ?Allergies  ?Allergen Reactions  ? Glipizide Other (See Comments)  ?  Headache  ? Metformin And Related Nausea And Vomiting  ?  Body aches  ? ? ?No orders of the defined types were placed in this encounter. ? ? ?AUTHORIZATION INFORMATION ?Primary Insurance: [employer],  ID #: [id number],  Group #: Individual [employer] [place] ?Pre-Cert / Josem Kaufmann required: No, not required ? ?SCHEDULE INFORMATION: ?Procedure has been scheduled as follows:  ?Date: 11/08/2021, Time: 9:00 ?Location: [facility] with Dr. Abbey Chatters ? ?This Gastroenterology Pre-Procedure Review Form is being routed to the following provider(s): Aliene Altes, PA-C ?  ?

## 2021-10-10 NOTE — Progress Notes (Signed)
Okay to schedule.  ASA 2. ? ?Medication adjustments: ?1 day prior to procedure: Take one half dose of Lantus (15 units) at bedtime.  Okay to take Trulicity as prescribed. ?Day of procedure: Do not take any morning diabetes medications. ?

## 2021-10-11 ENCOUNTER — Encounter: Payer: Self-pay | Admitting: *Deleted

## 2021-10-11 MED ORDER — PEG 3350-KCL-NA BICARB-NACL 420 G PO SOLR: 4000.0000 mL | Freq: Once | ORAL | 0 refills | Status: AC

## 2021-10-11 NOTE — Progress Notes (Signed)
Spoke to pt.  Scheduled procedure for 11/08/2021 at 9:00, arrival 7:30 at [facility].  Reviewed prep instructions with pt by phone.  Pt aware of diabetes medication adjustments.  Pt aware that I sent RX to his pharmacy for pick up.  He is aware to pick up required OTC items as well.  He was informed that I am mailing out all information discussed.   ?

## 2021-10-11 NOTE — Addendum Note (Signed)
Addended by: Metro Kung on: 10/11/2021 12:26 PM ? ? Modules accepted: Orders ? ?

## 2021-10-13 ENCOUNTER — Ambulatory Visit (HOSPITAL_COMMUNITY)
Admission: RE | Admit: 2021-10-13 | Discharge: 2021-10-13 | Disposition: A | Discharge status: Home / Self Care | Payer: 59 | Source: Ambulatory Visit | Attending: Orthopaedic Surgery | Admitting: Orthopaedic Surgery

## 2021-10-13 DIAGNOSIS — M5432 Sciatica, left side: Secondary | ICD-10-CM | POA: Insufficient documentation

## 2021-10-13 IMAGING — MR MR LUMBAR SPINE W/O CM
5 series · 31 of 48 positions shown · non-contrast
Comparison: Radiographs [DATE].

CLINICAL DATA: Left sided sciatica [IF] ([IF]-CM).

EXAM:
MRI LUMBAR SPINE WITHOUT CONTRAST
TECHNIQUE: Multiplanar, multisequence MR imaging of the lumbar spine was
performed. No intravenous contrast was administered.

[Series 5: T2 · sagittal · 4.0mm · 0.68mm/px · 6 of 15 slices shown (1 of 2)]
[im 1/15]
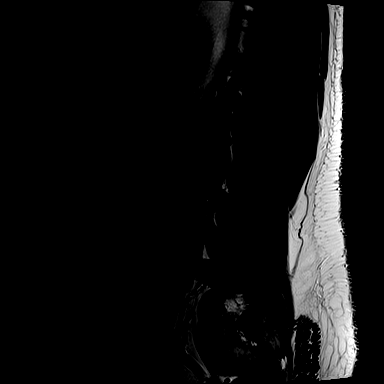
[im 3/15]
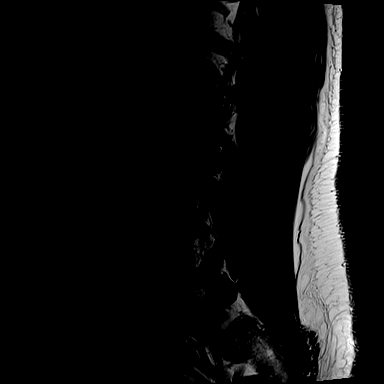
[im 6/15]
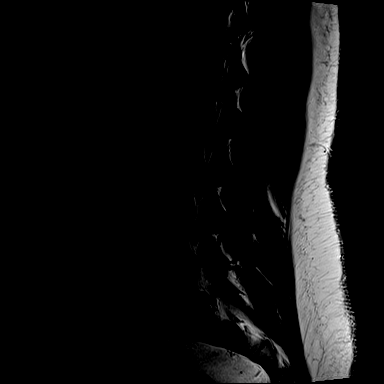
[im 9/15]
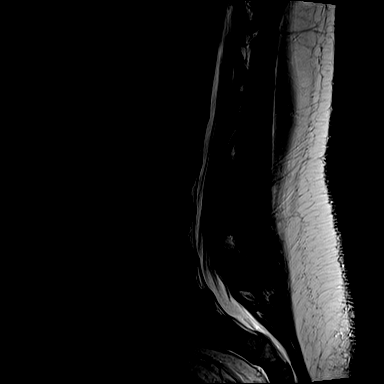
[im 12/15]
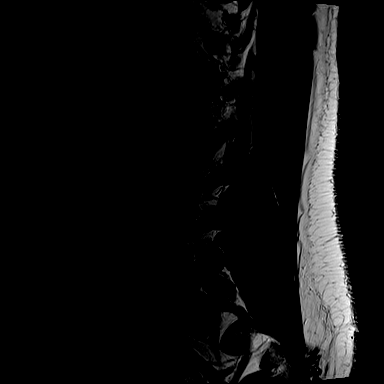
[im 15/15]
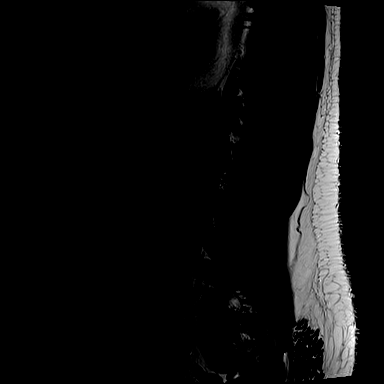

[Series 6: T1 · sagittal · 4.0mm · 0.81mm/px · 7 of 15 slices shown (1 of 2)]
[im 1/15]
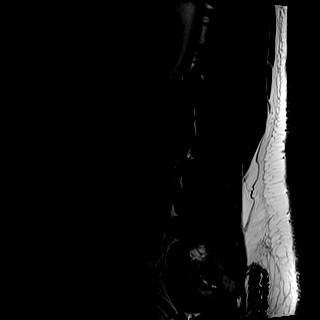
[im 3/15]
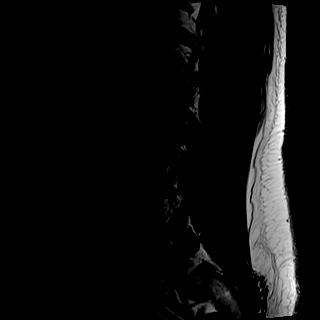
[im 5/15]
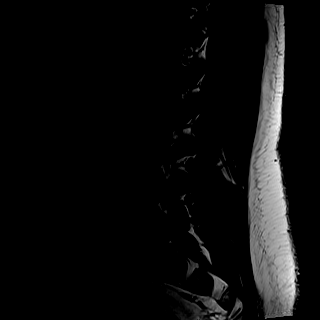
[im 8/15]
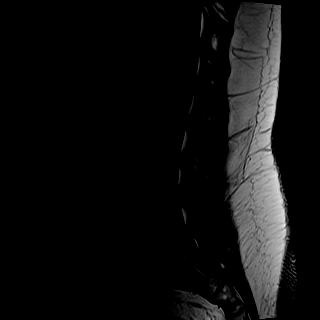
[im 10/15]
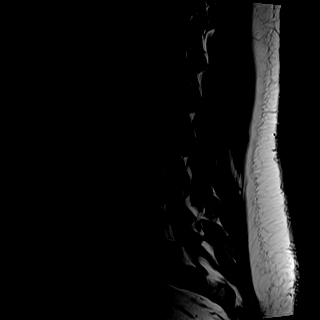
[im 12/15]
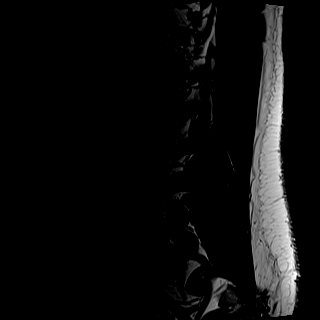
[im 15/15]
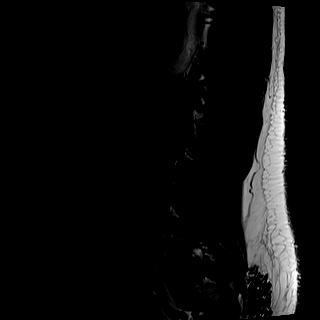

[Series 7: STIR · sagittal · 4.0mm · 0.51mm/px · 2 of 15 slices shown]
[im 1/15]
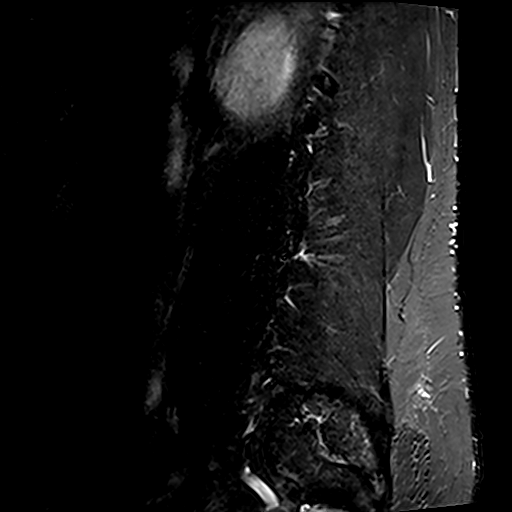
[im 3/15]
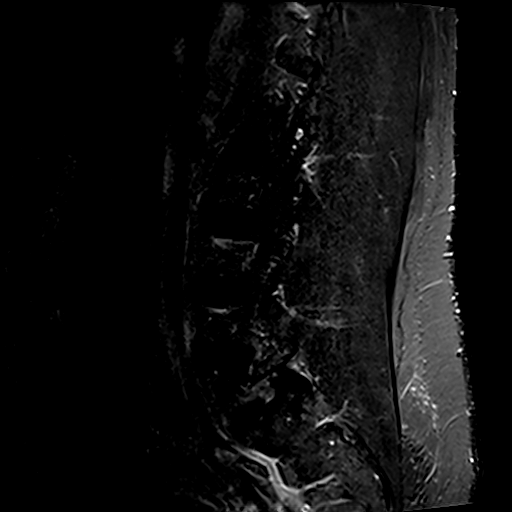

[Series 8: T2 · axial · 4.0mm · 0.70mm/px · z∈[-76,+104]mm · 8 of 32 slices shown (2 of 2)]
[im 1/32]
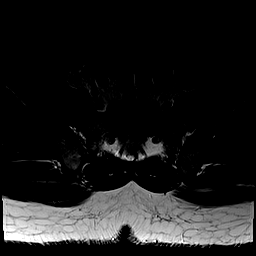
[im 5/32]
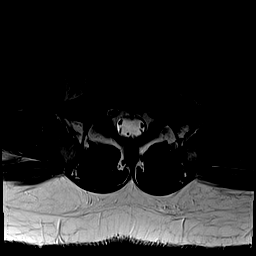
[im 10/32]
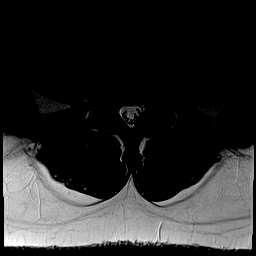
[im 15/32]
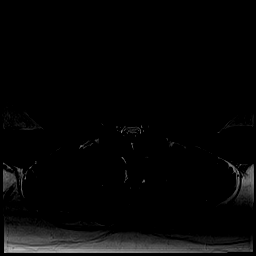
[im 17/32]
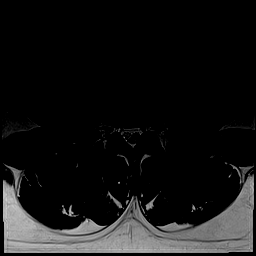
[im 22/32]
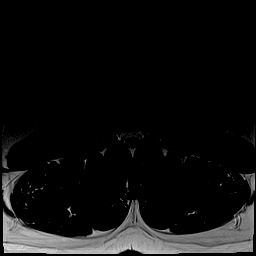
[im 27/32]
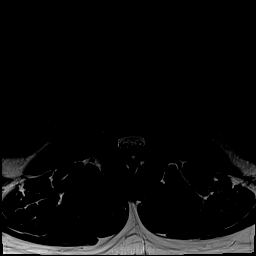
[im 32/32]
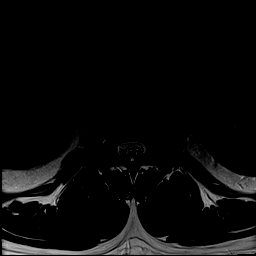

[Series 9: T1 · axial · 4.0mm · 0.35mm/px · z∈[-76,+104]mm · 8 of 32 slices shown (2 of 2)]
[im 1/32]
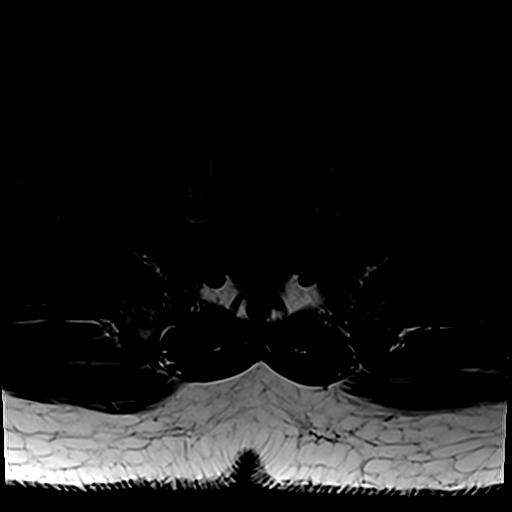
[im 5/32]
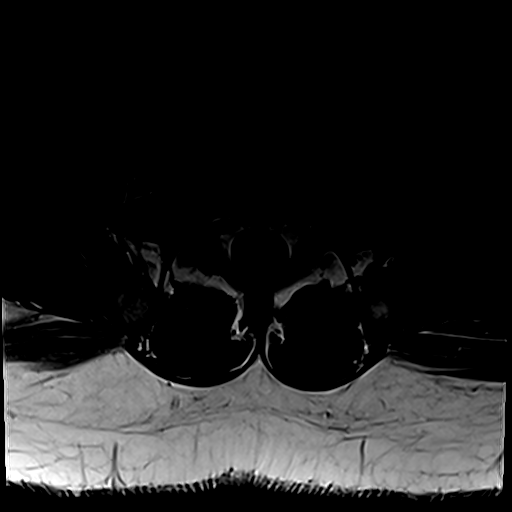
[im 10/32]
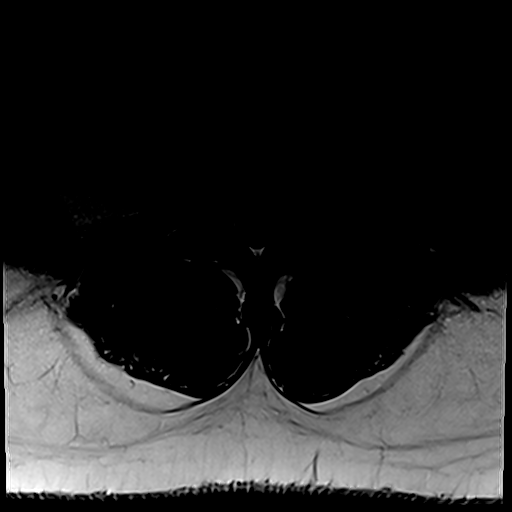
[im 15/32]
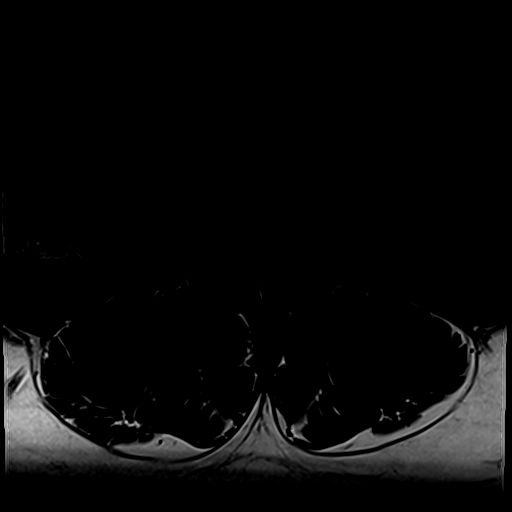
[im 17/32]
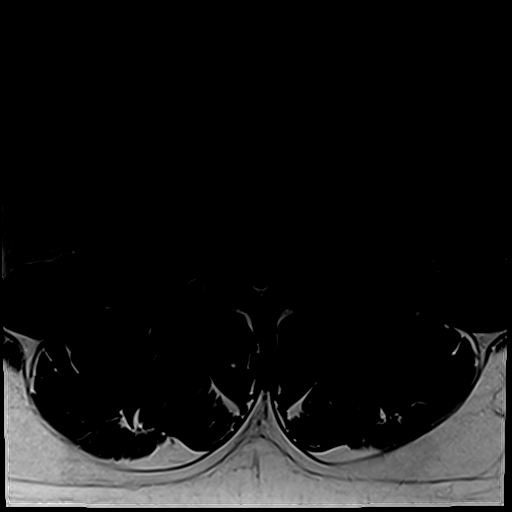
[im 22/32]
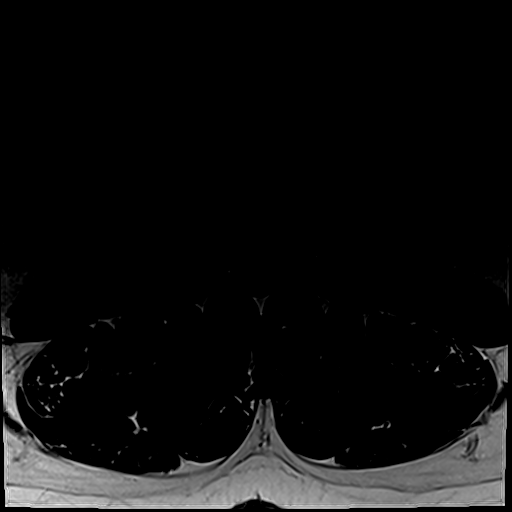
[im 27/32]
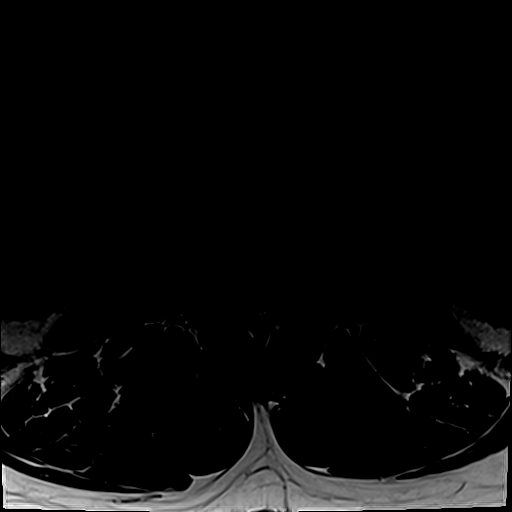
[im 32/32]
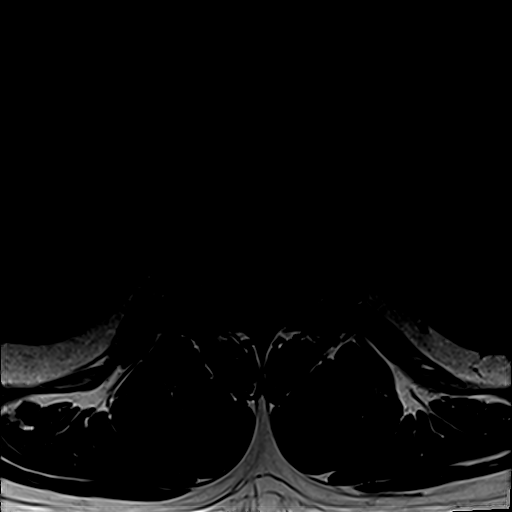

[31 of 48 positions shown; findings below may reference images not displayed]

FINDINGS: Segmentation:  Standard.

Alignment:  Physiologic.

Vertebrae:  No fracture, evidence of discitis, or bone lesion.

Conus medullaris and cauda equina: Conus extends to the L1-2 level.
Conus and cauda equina appear normal.

Paraspinal and other soft tissues: Negative.

Disc levels:

L1-2: No spinal canal or neural foraminal stenosis.

L2-3: Shallow disc bulge. No spinal canal or neural foraminal
stenosis.

L3-4: Shallow disc bulge and mild facet degenerative changes. No
significant spinal canal or neural foraminal stenosis.

L4-5: Mild loss of disc height, shallow disc bulge and mild facet
degenerative changes resulting in mild bilateral neural foraminal
narrowing. No significant spinal canal stenosis.

L5-S1: Loss of disc height, disc bulge with superimposed left
central disc protrusion causing abutting the traversing left S1
nerve root and narrowing the left subarticular zone.
Mild-to-moderate facet degenerative changes contribute for moderate
right and mild left neural foraminal narrowing.
IMPRESSION: 1. Left central disc protrusion at L5-S1 abuts the traversing left
S1 nerve root. Moderate right and mild left neural foraminal
narrowing at this level.
2. Mild bilateral neural foraminal narrowing at L4-5.
3. No high-grade spinal canal stenosis at any level.

## 2021-10-17 ENCOUNTER — Ambulatory Visit: Payer: 59 | Admitting: Nutrition

## 2021-10-18 ENCOUNTER — Ambulatory Visit: Payer: 59 | Admitting: Orthopaedic Surgery

## 2021-10-18 DIAGNOSIS — M75122 Complete rotator cuff tear or rupture of left shoulder, not specified as traumatic: Secondary | ICD-10-CM

## 2021-10-18 DIAGNOSIS — M5432 Sciatica, left side: Secondary | ICD-10-CM | POA: Diagnosis not present

## 2021-10-18 DIAGNOSIS — M545 Low back pain, unspecified: Secondary | ICD-10-CM

## 2021-10-18 DIAGNOSIS — M5136 Other intervertebral disc degeneration, lumbar region: Secondary | ICD-10-CM

## 2021-10-18 NOTE — Progress Notes (Signed)
My back is worse. ? ?He has lower back pain with left sided sciatica that is getting worse. ? ?He had MRI of the lumbar spine which showed: ?IMPRESSION: ?1. Left central disc protrusion at L5-S1 abuts the traversing left ?S1 nerve root. Moderate right and mild left neural foraminal ?narrowing at this level. ?2. Mild bilateral neural foraminal narrowing at L4-5. ?3. No high-grade spinal canal stenosis at any level. ?  ?I have explained the findings to him.  I will have him see neurosurgeon for this. ? ?He has seen Dr. Amedeo Kinsman for his left shoulder which has a rotator cuff injury and will need surgery as well. He will need to make a decision on what body part needs to be treated first. ? ?His lower back is tender, no spasm, ROM decreased, NV intact but slightly decreased left ankle reflex, SLR weakly positive left 30 degrees, no limp, muscle tone and strength normal. ? ?Encounter Diagnoses  ?Name Primary?  ? Left sided sciatica Yes  ? DDD (degenerative disc disease), lumbar   ? Lumbar pain   ? Complete tear of left rotator cuff, unspecified whether traumatic   ? ?To see neurosurgeon. ? ?Continue the ibuprofen. ? ?Call if any problem. ? ?Precautions discussed. ? ?Electronically Signed ?Sanjuana Kava, MD ?4/11/20239:10 AM ? ?

## 2021-10-20 ENCOUNTER — Encounter: Payer: Self-pay | Admitting: Nutrition

## 2021-10-25 ENCOUNTER — Ambulatory Visit: Payer: 59 | Admitting: Nurse Practitioner

## 2021-10-26 ENCOUNTER — Ambulatory Visit: Payer: 59 | Admitting: Nurse Practitioner

## 2021-10-26 ENCOUNTER — Encounter: Payer: 59 | Attending: Nurse Practitioner | Admitting: Nutrition

## 2021-10-26 ENCOUNTER — Encounter: Payer: Self-pay | Admitting: Nutrition

## 2021-10-26 ENCOUNTER — Ambulatory Visit (INDEPENDENT_AMBULATORY_CARE_PROVIDER_SITE_OTHER): Payer: 59 | Admitting: Nurse Practitioner

## 2021-10-26 ENCOUNTER — Encounter: Payer: Self-pay | Admitting: Nurse Practitioner

## 2021-10-26 VITALS — BP 111/77 | HR 91 | Ht 68.0 in | Wt 218.8 lb

## 2021-10-26 DIAGNOSIS — E1165 Type 2 diabetes mellitus with hyperglycemia: Secondary | ICD-10-CM | POA: Insufficient documentation

## 2021-10-26 DIAGNOSIS — I10 Essential (primary) hypertension: Secondary | ICD-10-CM

## 2021-10-26 DIAGNOSIS — Z713 Dietary counseling and surveillance: Secondary | ICD-10-CM | POA: Diagnosis not present

## 2021-10-26 DIAGNOSIS — E782 Mixed hyperlipidemia: Secondary | ICD-10-CM

## 2021-10-26 LAB — POCT GLYCOSYLATED HEMOGLOBIN (HGB A1C): HbA1c, POC (controlled diabetic range): 10.1 % — AB (ref 0.0–7.0)

## 2021-10-26 NOTE — Progress Notes (Signed)
Medical Nutrition Therapy  ?Appointment Start time:  1443  Appointment End time:  1430 ? ?Primary concerns today: Type 2 DM  ?Referral diagnosis: E11.8 ?Preferred learning style: No preference. ?Learning readiness: Change in progress ? ? ?NUTRITION ASSESSMENT  ?Walking very slow today. His back is giving him a lot of trouble right now. ?Going to back specialist for a pinched nerve in his back. He can't sleep well due to pain and ends up eating  throughout the night when he can't fall back to sleep. This is effecting his blood sugars. ?Admits to not drinking enough water. ?Unable to exercise or do much due to severe back pain. Has gone through PT. ?A1C 10.1%   ?Currently taking Latnus 30 units at night, Trulicity 3 g weekly. ? ?Anthropometrics  ?Wt Readings from Last 3 Encounters:  ?10/26/21 218 lb 12.8 oz (99.2 kg)  ?10/10/21 219 lb (99.3 kg)  ?09/29/21 218 lb (98.9 kg)  ? ?Ht Readings from Last 3 Encounters:  ?10/26/21 '5\' 8"'$  (1.727 m)  ?10/10/21 '5\' 8"'$  (1.727 m)  ?09/29/21 '5\' 8"'$  (1.727 m)  ? ?There is no height or weight on file to calculate BMI. ?'@BMIFA'$ @ ?Facility age limit for growth percentiles is 20 years. ?Facility age limit for growth percentiles is 20 years. ?  ? ?Clinical ?Medical Hx: see chart ?Medications: Lantus 30 units now. Had been up to 50 units and was having some low blood sugars. Had tried to get Trulicity but insuranace denied. ? ?Labs:  ?Lab Results  ?Component Value Date  ? HGBA1C 10.1 (A) 10/26/2021  ? ?Lipid Panel  ?   ?Component Value Date/Time  ? CHOL 230 (H) 03/04/2021 0947  ? TRIG 127 03/04/2021 0947  ? HDL 35 (L) 03/04/2021 0947  ? CHOLHDL 5.9 (H) 07/28/2019 1030  ? VLDL 21 08/27/2014 0852  ? [place] 172 (H) 03/04/2021 0947  ? LDLCALC 164 (H) 07/28/2019 1030  ? LABVLDL 23 03/04/2021 0947  ? ? ?Notable Signs/Symptoms: None ? 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 ?He does most of the cooking at home. ?Wife dieting right now. ?Has had some back  issues. ? ?Estimated daily fluid intake: 80 oz water ?Supplements: MVI ?Sleep: 7 hours ?Stress / self-care: none ?Current average weekly physical activity: working ? ?24-Hr Dietary Recall ?First Meal: Skipped:   ?Snack:  ?Second Meal: Small fry and oatmeal cookie ?Snack:  ?Third Meal: Fish or chicken,  greens, apples, water ?Snack:  ?Beverages: water  ? ?Eating more baked salmon, some red meat, ?Estimated Energy Needs ?Calories: 1800-2000 ?Carbohydrate: 200g ?Protein: 135g ?Fat: 50g ? ? ?NUTRITION DIAGNOSIS  ?NB-1.1 Food and nutrition-related knowledge deficit As related to Diabetes Type 2.  As evidenced by A1C 9%. ? ? ?NUTRITION INTERVENTION  ?Nutrition education (E-1) on the following topics:  ?Nutrition and Diabetes education provided on My Plate, CHO counting, meal planning, portion sizes, timing of meals, avoiding snacks between meals unless having a low blood sugar, target ranges for A1C and blood sugars, signs/symptoms and treatment of hyper/hypoglycemia, monitoring blood sugars, taking medications as prescribed, benefits of exercising 30 minutes per day and prevention of complications of DM. ?Lifestyle Medicine ?- Whole Food, Plant Predominant Nutrition is highly recommended: Eat Plenty of vegetables, Mushrooms, fruits, Legumes, Whole Grains, Nuts, seeds in lieu of processed meats, processed snacks/pastries red meat, poultry, eggs.  ?  ?-It is better to avoid simple carbohydrates including: Cakes, Sweet Desserts, Ice Cream, Soda (diet and regular), Sweet Tea, Candies, Chips, Cookies, Store Bought Juices, Alcohol in  Excess of  1-2 drinks a day, Lemonade,  Artificial Sweeteners, Doughnuts, Coffee Creamers, "Sugar-free" Products, etc, etc.  This is not a complete list..... ? ?Exercise: If you are able: 30 -60 minutes a day ,4 days a week, or 150 minutes a week.  The longer the better.  Combine stretch, strength, and aerobic activities.  If you were told in the past that you have high risk for cardiovascular  diseases, you may seek evaluation by your heart doctor prior to initiating moderate to intense exercise programs. ? ?Handouts Provided Include  ?LIvestyle medicine plant based meal plan ?Meal Plan Card ?Calorie Density of foods ? ?Learning Style & Readiness for Change ?Teaching method utilized: Visual & Auditory  ?Demonstrated degree of understanding via: Teach Back  ?Barriers to learning/adherence to lifestyle change: none ? ?Goals Established by Pt ?Goals ?Goals  ?Increase water to a gallon per day. ?Increase fresh fruits and vegetables ?Try to cut out junk food ?Try unsalted walnuts or almonds ?Try eating low carb vegetables when up at night instead of junk food or empty calorie foods. ? ? ? ?MONITORING & EVALUATION ?Dietary intake, weekly physical activity, and blood sugars  in 3 month. ? ?Next Steps  ?Patient is to work on better eating habits and timing of meals.. ? ?
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
## 2021-10-26 NOTE — Patient Instructions (Signed)
Diabetes Mellitus and Foot Care Foot care is an important part of your health, especially when you have diabetes. Diabetes may cause you to have problems because of poor blood flow (circulation) to your feet and legs, which can cause your skin to: Become thinner and drier. Break more easily. Heal more slowly. Peel and crack. You may also have nerve damage (neuropathy) in your legs and feet, causing decreased feeling in them. This means that you may not notice minor injuries to your feet that could lead to more serious problems. Noticing and addressing any potential problems early is the best way to prevent future foot problems. How to care for your feet Foot hygiene  Wash your feet daily with warm water and mild soap. Do not use hot water. Then, pat your feet and the areas between your toes until they are completely dry. Do not soak your feet as this can dry your skin. Trim your toenails straight across. Do not dig under them or around the cuticle. File the edges of your nails with an emery board or nail file. Apply a moisturizing lotion or petroleum jelly to the skin on your feet and to dry, brittle toenails. Use lotion that does not contain alcohol and is unscented. Do not apply lotion between your toes. Shoes and socks Wear clean socks or stockings every day. Make sure they are not too tight. Do not wear knee-high stockings since they may decrease blood flow to your legs. Wear shoes that fit properly and have enough cushioning. Always look in your shoes before you put them on to be sure there are no objects inside. To break in new shoes, wear them for just a few hours a day. This prevents injuries on your feet. Wounds, scrapes, corns, and calluses  Check your feet daily for blisters, cuts, bruises, sores, and redness. If you cannot see the bottom of your feet, use a mirror or ask someone for help. Do not cut corns or calluses or try to remove them with medicine. If you find a minor scrape,  cut, or break in the skin on your feet, keep it and the skin around it clean and dry. You may clean these areas with mild soap and water. Do not clean the area with peroxide, alcohol, or iodine. If you have a wound, scrape, corn, or callus on your foot, look at it several times a day to make sure it is healing and not infected. Check for: Redness, swelling, or pain. Fluid or blood. Warmth. Pus or a bad smell. General tips Do not cross your legs. This may decrease blood flow to your feet. Do not use heating pads or hot water bottles on your feet. They may burn your skin. If you have lost feeling in your feet or legs, you may not know this is happening until it is too late. Protect your feet from hot and cold by wearing shoes, such as at the beach or on hot pavement. Schedule a complete foot exam at least once a year (annually) or more often if you have foot problems. Report any cuts, sores, or bruises to your health care provider immediately. Where to find more information American Diabetes Association: www.diabetes.org Association of Diabetes Care & Education Specialists: www.diabeteseducator.org Contact a health care provider if: You have a medical condition that increases your risk of infection and you have any cuts, sores, or bruises on your feet. You have an injury that is not healing. You have redness on your legs or feet. You   feel burning or tingling in your legs or feet. You have pain or cramps in your legs and feet. Your legs or feet are numb. Your feet always feel cold. You have pain around any toenails. Get help right away if: You have a wound, scrape, corn, or callus on your foot and: You have pain, swelling, or redness that gets worse. You have fluid or blood coming from the wound, scrape, corn, or callus. Your wound, scrape, corn, or callus feels warm to the touch. You have pus or a bad smell coming from the wound, scrape, corn, or callus. You have a fever. You have a red  line going up your leg. Summary Check your feet every day for blisters, cuts, bruises, sores, and redness. Apply a moisturizing lotion or petroleum jelly to the skin on your feet and to dry, brittle toenails. Wear shoes that fit properly and have enough cushioning. If you have foot problems, report any cuts, sores, or bruises to your health care provider immediately. Schedule a complete foot exam at least once a year (annually) or more often if you have foot problems. This information is not intended to replace advice given to you by your health care provider. Make sure you discuss any questions you have with your health care provider. Document Revised: 01/15/2020 Document Reviewed: 01/15/2020 Elsevier Patient Education  2023 [employer]  

## 2021-10-26 NOTE — Patient Instructions (Incomplete)
Diabetes Mellitus and Foot Care Foot care is an important part of your health, especially when you have diabetes. Diabetes may cause you to have problems because of poor blood flow (circulation) to your feet and legs, which can cause your skin to: Become thinner and drier. Break more easily. Heal more slowly. Peel and crack. You may also have nerve damage (neuropathy) in your legs and feet, causing decreased feeling in them. This means that you may not notice minor injuries to your feet that could lead to more serious problems. Noticing and addressing any potential problems early is the best way to prevent future foot problems. How to care for your feet Foot hygiene  Wash your feet daily with warm water and mild soap. Do not use hot water. Then, pat your feet and the areas between your toes until they are completely dry. Do not soak your feet as this can dry your skin. Trim your toenails straight across. Do not dig under them or around the cuticle. File the edges of your nails with an emery board or nail file. Apply a moisturizing lotion or petroleum jelly to the skin on your feet and to dry, brittle toenails. Use lotion that does not contain alcohol and is unscented. Do not apply lotion between your toes. Shoes and socks Wear clean socks or stockings every day. Make sure they are not too tight. Do not wear knee-high stockings since they may decrease blood flow to your legs. Wear shoes that fit properly and have enough cushioning. Always look in your shoes before you put them on to be sure there are no objects inside. To break in new shoes, wear them for just a few hours a day. This prevents injuries on your feet. Wounds, scrapes, corns, and calluses  Check your feet daily for blisters, cuts, bruises, sores, and redness. If you cannot see the bottom of your feet, use a mirror or ask someone for help. Do not cut corns or calluses or try to remove them with medicine. If you find a minor scrape,  cut, or break in the skin on your feet, keep it and the skin around it clean and dry. You may clean these areas with mild soap and water. Do not clean the area with peroxide, alcohol, or iodine. If you have a wound, scrape, corn, or callus on your foot, look at it several times a day to make sure it is healing and not infected. Check for: Redness, swelling, or pain. Fluid or blood. Warmth. Pus or a bad smell. General tips Do not cross your legs. This may decrease blood flow to your feet. Do not use heating pads or hot water bottles on your feet. They may burn your skin. If you have lost feeling in your feet or legs, you may not know this is happening until it is too late. Protect your feet from hot and cold by wearing shoes, such as at the beach or on hot pavement. Schedule a complete foot exam at least once a year (annually) or more often if you have foot problems. Report any cuts, sores, or bruises to your health care provider immediately. Where to find more information American Diabetes Association: www.diabetes.org Association of Diabetes Care & Education Specialists: www.diabeteseducator.org Contact a health care provider if: You have a medical condition that increases your risk of infection and you have any cuts, sores, or bruises on your feet. You have an injury that is not healing. You have redness on your legs or feet. You   feel burning or tingling in your legs or feet. You have pain or cramps in your legs and feet. Your legs or feet are numb. Your feet always feel cold. You have pain around any toenails. Get help right away if: You have a wound, scrape, corn, or callus on your foot and: You have pain, swelling, or redness that gets worse. You have fluid or blood coming from the wound, scrape, corn, or callus. Your wound, scrape, corn, or callus feels warm to the touch. You have pus or a bad smell coming from the wound, scrape, corn, or callus. You have a fever. You have a red  line going up your leg. Summary Check your feet every day for blisters, cuts, bruises, sores, and redness. Apply a moisturizing lotion or petroleum jelly to the skin on your feet and to dry, brittle toenails. Wear shoes that fit properly and have enough cushioning. If you have foot problems, report any cuts, sores, or bruises to your health care provider immediately. Schedule a complete foot exam at least once a year (annually) or more often if you have foot problems. This information is not intended to replace advice given to you by your health care provider. Make sure you discuss any questions you have with your health care provider. Document Revised: 01/15/2020 Document Reviewed: 01/15/2020 Elsevier Patient Education  2023 Elsevier Inc.  

## 2021-10-26 NOTE — Patient Instructions (Addendum)
Goals  ?Increase water to a gallon per day. ?Increase fresh fruits and vegetables ?Try to cut out junk food ?Try unsalted walnuts or almonds ?Try eating low carb vegetables when up at night instead of junk food or empty calorie foods. ? ? ?

## 2021-11-03 ENCOUNTER — Ambulatory Visit
Admission: EM | Admit: 2021-11-03 | Discharge: 2021-11-03 | Disposition: A | Discharge status: Home / Self Care | Payer: 59 | Attending: Family Medicine | Admitting: Family Medicine

## 2021-11-03 DIAGNOSIS — J0100 Acute maxillary sinusitis, unspecified: Secondary | ICD-10-CM | POA: Diagnosis not present

## 2021-11-03 MED ORDER — AMOXICILLIN 875 MG PO TABS: 875.0000 mg | ORAL_TABLET | Freq: Two times a day (BID) | ORAL | 0 refills | Status: AC
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
## 2021-11-03 NOTE — ED Triage Notes (Signed)
Pt reports nasal congestion, head tightness and cough x 5 days. Maxalt gives x  some relief with head tightness.  ?

## 2021-11-07 ENCOUNTER — Telehealth: Payer: Self-pay | Admitting: *Deleted

## 2021-11-07 ENCOUNTER — Encounter: Payer: Self-pay | Admitting: *Deleted

## 2021-11-07 NOTE — Telephone Encounter (Signed)
Wife called in.  She requested that pt's procedure be rescheduled due to some issues he is having with his back.  Rescheduled procedure to 12/02/2021 at 11:00, arrival 9:30 at [facility]  She is aware that I am mailing out new instructions. ?

## 2021-12-02 ENCOUNTER — Other Ambulatory Visit: Payer: Self-pay

## 2021-12-02 ENCOUNTER — Ambulatory Visit (HOSPITAL_BASED_OUTPATIENT_CLINIC_OR_DEPARTMENT_OTHER): Payer: 59 | Admitting: Anesthesiology

## 2021-12-02 ENCOUNTER — Ambulatory Visit (HOSPITAL_COMMUNITY)
Admission: RE | Admit: 2021-12-02 | Discharge: 2021-12-02 | Disposition: A | Discharge status: Home / Self Care | Payer: 59 | Attending: Internal Medicine | Admitting: Internal Medicine

## 2021-12-02 ENCOUNTER — Encounter (HOSPITAL_COMMUNITY)
Admission: RE | Disposition: A | Discharge status: Home / Self Care | Payer: Self-pay | Source: Home / Self Care | Attending: Internal Medicine

## 2021-12-02 ENCOUNTER — Encounter (HOSPITAL_COMMUNITY): Payer: Self-pay

## 2021-12-02 ENCOUNTER — Ambulatory Visit (HOSPITAL_COMMUNITY): Payer: 59 | Admitting: Anesthesiology

## 2021-12-02 DIAGNOSIS — R221 Localized swelling, mass and lump, neck: Secondary | ICD-10-CM

## 2021-12-02 DIAGNOSIS — E785 Hyperlipidemia, unspecified: Secondary | ICD-10-CM | POA: Diagnosis not present

## 2021-12-02 DIAGNOSIS — Z7985 Long-term (current) use of injectable non-insulin antidiabetic drugs: Secondary | ICD-10-CM | POA: Diagnosis not present

## 2021-12-02 DIAGNOSIS — I10 Essential (primary) hypertension: Secondary | ICD-10-CM

## 2021-12-02 DIAGNOSIS — K648 Other hemorrhoids: Secondary | ICD-10-CM

## 2021-12-02 DIAGNOSIS — M545 Low back pain, unspecified: Secondary | ICD-10-CM

## 2021-12-02 DIAGNOSIS — E1165 Type 2 diabetes mellitus with hyperglycemia: Secondary | ICD-10-CM | POA: Diagnosis not present

## 2021-12-02 DIAGNOSIS — Z1211 Encounter for screening for malignant neoplasm of colon: Secondary | ICD-10-CM

## 2021-12-02 DIAGNOSIS — F4321 Adjustment disorder with depressed mood: Secondary | ICD-10-CM

## 2021-12-02 DIAGNOSIS — R5383 Other fatigue: Secondary | ICD-10-CM

## 2021-12-02 DIAGNOSIS — K5730 Diverticulosis of large intestine without perforation or abscess without bleeding: Secondary | ICD-10-CM | POA: Diagnosis not present

## 2021-12-02 DIAGNOSIS — Z7984 Long term (current) use of oral hypoglycemic drugs: Secondary | ICD-10-CM

## 2021-12-02 DIAGNOSIS — M25511 Pain in right shoulder: Secondary | ICD-10-CM

## 2021-12-02 DIAGNOSIS — E119 Type 2 diabetes mellitus without complications: Secondary | ICD-10-CM | POA: Insufficient documentation

## 2021-12-02 DIAGNOSIS — E782 Mixed hyperlipidemia: Secondary | ICD-10-CM

## 2021-12-02 DIAGNOSIS — Z6833 Body mass index (BMI) 33.0-33.9, adult: Secondary | ICD-10-CM | POA: Insufficient documentation

## 2021-12-02 DIAGNOSIS — Z794 Long term (current) use of insulin: Secondary | ICD-10-CM | POA: Insufficient documentation

## 2021-12-02 DIAGNOSIS — M25512 Pain in left shoulder: Secondary | ICD-10-CM

## 2021-12-02 HISTORY — PX: COLONOSCOPY WITH PROPOFOL: SHX5780

## 2021-12-02 LAB — GLUCOSE, CAPILLARY: Glucose-Capillary: 98 mg/dL (ref 70–99)

## 2021-12-02 SURGERY — COLONOSCOPY WITH PROPOFOL
Anesthesia: General

## 2021-12-02 MED ORDER — PROPOFOL 10 MG/ML IV BOLUS
INTRAVENOUS | Status: DC | PRN
Start: 2021-12-02 — End: 2021-12-02
  Administered 2021-12-02: 100 mg via INTRAVENOUS
  Administered 2021-12-02: 60 mg via INTRAVENOUS
  Administered 2021-12-02: 50 mg via INTRAVENOUS
  Administered 2021-12-02: 40 mg via INTRAVENOUS
  Administered 2021-12-02: 50 mg via INTRAVENOUS

## 2021-12-02 MED ORDER — LACTATED RINGERS IV SOLN: INTRAVENOUS | Status: DC

## 2021-12-02 MED ORDER — LIDOCAINE HCL (CARDIAC) PF 100 MG/5ML IV SOSY
PREFILLED_SYRINGE | INTRAVENOUS | Status: DC | PRN
  Administered 2021-12-02: 50 mg via INTRAVENOUS

## 2021-12-02 NOTE — Op Note (Signed)
[facility] Patient Name: Travis Delgado Procedure Date: 12/02/2021 10:20 AM MRN: [id number] Date of Birth: Mar 08, 1971 Attending MD: Elon Alas. Abbey Chatters DO CSN: [id number] Age: 51 Admit Type: Outpatient Procedure:                Colonoscopy Indications:              Screening for colorectal malignant neoplasm Providers:                Elon Alas. Abbey Chatters, DO, Lambert Mody, Dereck Leep, Technician Referring MD:              Medicines:                See the Anesthesia note for documentation of the                            administered medications Complications:            No immediate complications. Estimated Blood Loss:     Estimated blood loss: none. Procedure:                Pre-Anesthesia Assessment:                           - The anesthesia plan was to use monitored                            anesthesia care (MAC).                           After obtaining informed consent, the colonoscope                            was passed under direct vision. Throughout the                            procedure, the patient's blood pressure, pulse, and                            oxygen saturations were monitored continuously. The                            [id number]-[id number] ([id number]) scope was introduced through                            the anus and advanced to the the cecum, identified                            by appendiceal orifice and ileocecal valve. The                            colonoscopy was performed without difficulty. The                            patient tolerated the procedure well. The  quality                            of the bowel preparation was evaluated using the                            BBPS [facility] Bowel Preparation Scale) with scores                            of: Right Colon = 3, Transverse Colon = 3 and Left                            Colon = 3 (entire mucosa seen well with no residual                             staining, small fragments of stool or opaque                            liquid). The total BBPS score equals 9. Scope In: 10:30:00 AM Scope Out: 10:44:49 AM Scope Withdrawal Time: 0 hours 10 minutes 9 seconds  Total Procedure Duration: 0 hours 14 minutes 49 seconds  Findings:      The perianal and digital rectal examinations were normal.      Non-bleeding internal hemorrhoids were found during endoscopy.      A few small-mouthed diverticula were found in the sigmoid colon.      The exam was otherwise without abnormality. Impression:               - Non-bleeding internal hemorrhoids.                           - Diverticulosis in the sigmoid colon.                           - The examination was otherwise normal.                           - No specimens collected. Moderate Sedation:      Per Anesthesia Care Recommendation:           - Patient has a contact number available for                            emergencies. The signs and symptoms of potential                            delayed complications were discussed with the                            patient. Return to normal activities tomorrow.                            Written discharge instructions were provided to the                            patient.                           -  Resume previous diet.                           - Continue present medications.                           - Repeat colonoscopy in 10 years for screening                            purposes.                           - Return to GI clinic PRN. Procedure Code(s):        --- Professional ---                           O0355, Colorectal cancer screening; colonoscopy on                            individual not meeting criteria for high risk Diagnosis Code(s):        --- Professional ---                           Z12.11, Encounter for screening for malignant                            neoplasm of colon                           K64.8, Other hemorrhoids                            K57.30, Diverticulosis of large intestine without                            perforation or abscess without bleeding CPT copyright 2019 American Medical Association. All rights reserved. The codes documented in this report are preliminary and upon coder review may  be revised to meet current compliance requirements. Elon Alas. Abbey Chatters, DO [place] Abbey Chatters, DO 12/02/2021 10:47:55 AM This report has been signed electronically. Number of Addenda: 0

## 2021-12-02 NOTE — Anesthesia Preprocedure Evaluation (Addendum)
Anesthesia Evaluation  Patient identified by MRN, date of birth, ID band Patient awake    Reviewed: Allergy & Precautions, NPO status , Patient's Chart, lab work & pertinent test results  Airway Mallampati: III  TM Distance: >3 FB Neck ROM: Full    Dental  (+) Dental Advisory Given, Missing   Pulmonary former smoker,    Pulmonary exam normal breath sounds clear to auscultation       Cardiovascular Exercise Tolerance: Good hypertension,  Rhythm:Regular Rate:Normal + Systolic murmurs    Neuro/Psych negative neurological ROS  negative psych ROS   GI/Hepatic negative GI ROS, Neg liver ROS,   Endo/Other  diabetes, Poorly Controlled, Type 2, Oral Hypoglycemic Agents, Insulin Dependent  Renal/GU negative Renal ROS  negative genitourinary   Musculoskeletal negative musculoskeletal ROS (+)   Abdominal   Peds negative pediatric ROS (+)  Hematology negative hematology ROS (+)   Anesthesia Other Findings Mixed hyperlipidemia Bilateral shoulder pain Uncontrolled type 2 diabetes mellitus with hyperglycemia (HCC) Morbid obesity (HCC) Benign essential hypertension Fatigue Mass of left side of neck Grief Acute midline low back pain without sciatica    Reproductive/Obstetrics negative OB ROS                            Anesthesia Physical Anesthesia Plan  ASA: 3  Anesthesia Plan: General   Post-op Pain Management: Minimal or no pain anticipated   Induction: Intravenous  PONV Risk Score and Plan: Propofol infusion  Airway Management Planned: Nasal Cannula and Natural Airway  Additional Equipment:   Intra-op Plan:   Post-operative Plan: Extubation in OR  Informed Consent: I have reviewed the patients History and Physical, chart, labs and discussed the procedure including the risks, benefits and alternatives for the proposed anesthesia with the patient or authorized representative who has  indicated his/her understanding and acceptance.     Dental advisory given  Plan Discussed with: CRNA and Surgeon  Anesthesia Plan Comments:        Anesthesia Quick Evaluation

## 2021-12-02 NOTE — Anesthesia Procedure Notes (Signed)
Date/Time: 12/02/2021 10:31 AM Performed by: Orlie Dakin, CRNA Pre-anesthesia Checklist: Patient identified, Emergency Drugs available, Suction available and Patient being monitored Patient Re-evaluated:Patient Re-evaluated prior to induction Oxygen Delivery Method: Nasal cannula Induction Type: IV induction Placement Confirmation: positive ETCO2

## 2021-12-02 NOTE — Discharge Instructions (Addendum)

## 2021-12-02 NOTE — Anesthesia Postprocedure Evaluation (Signed)
Anesthesia Post Note  Patient: Travis Delgado  Procedure(s) Performed: COLONOSCOPY WITH PROPOFOL  Patient location during evaluation: Phase II Anesthesia Type: General Level of consciousness: awake and alert and oriented Pain management: pain level controlled Vital Signs Assessment: post-procedure vital signs reviewed and stable Respiratory status: spontaneous breathing, nonlabored ventilation and respiratory function stable Cardiovascular status: blood pressure returned to baseline and stable Postop Assessment: no apparent nausea or vomiting Anesthetic complications: no   No notable events documented.   Last Vitals:  Vitals:   12/02/21 0955 12/02/21 1047  BP: 132/87 (!) 97/58  Pulse: 82   Resp: 19 17  Temp: 36.4 C (!) 36.3 C  SpO2: 100% 100%    Last Pain:  Vitals:   12/02/21 1047  TempSrc: Oral  PainSc: 0-No pain                 Carle Dargan C Enio Hornback

## 2021-12-02 NOTE — Transfer of Care (Signed)
Immediate Anesthesia Transfer of Care Note  Patient: Travis Delgado  Procedure(s) Performed: COLONOSCOPY WITH PROPOFOL  Patient Location: Endoscopy Unit  Anesthesia Type:General  Level of Consciousness: drowsy  Airway & Oxygen Therapy: Patient Spontanous Breathing  Post-op Assessment: Report given to RN and Post -op Vital signs reviewed and stable  Post vital signs: Reviewed and stable  Last Vitals:  Vitals Value Taken Time  BP    Temp    Pulse    Resp    SpO2      Last Pain:  Vitals:   12/02/21 1047  TempSrc: Oral  PainSc:       Patients Stated Pain Goal: 8 (30/09/23 3007)  Complications: No notable events documented.
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
## 2021-12-06 ENCOUNTER — Encounter: Payer: 59 | Admitting: Internal Medicine

## 2021-12-08 ENCOUNTER — Other Ambulatory Visit: Payer: Self-pay | Admitting: Internal Medicine

## 2021-12-08 ENCOUNTER — Encounter (HOSPITAL_COMMUNITY): Payer: Self-pay | Admitting: Internal Medicine

## 2021-12-08 DIAGNOSIS — G43819 Other migraine, intractable, without status migrainosus: Secondary | ICD-10-CM

## 2021-12-21 ENCOUNTER — Other Ambulatory Visit: Payer: Self-pay

## 2021-12-21 DIAGNOSIS — I10 Essential (primary) hypertension: Secondary | ICD-10-CM

## 2021-12-21 MED ORDER — ATORVASTATIN CALCIUM 40 MG PO TABS: 40.0000 mg | ORAL_TABLET | Freq: Every day | ORAL | 1 refills | Status: DC

## 2022-01-27 ENCOUNTER — Ambulatory Visit: Payer: 59 | Admitting: Nurse Practitioner

## 2022-01-28 LAB — COMPREHENSIVE METABOLIC PANEL
ALT: 33 IU/L (ref 0–44)
AST: 24 IU/L (ref 0–40)
Albumin/Globulin Ratio: 1.7 (ref 1.2–2.2)
Albumin: 4.5 g/dL (ref 4.1–5.1)
Alkaline Phosphatase: 45 IU/L (ref 44–121)
BUN/Creatinine Ratio: 11 (ref 9–20)
BUN: 11 mg/dL (ref 6–24)
Bilirubin Total: 0.4 mg/dL (ref 0.0–1.2)
CO2: 22 mmol/L (ref 20–29)
Calcium: 9.2 mg/dL (ref 8.7–10.2)
Chloride: 106 mmol/L (ref 96–106)
Creatinine, Ser: 0.99 mg/dL (ref 0.76–1.27)
Globulin, Total: 2.7 g/dL (ref 1.5–4.5)
Glucose: 145 mg/dL — ABNORMAL HIGH (ref 70–99)
Potassium: 4.1 mmol/L (ref 3.5–5.2)
Sodium: 141 mmol/L (ref 134–144)
Total Protein: 7.2 g/dL (ref 6.0–8.5)
eGFR: 93 mL/min/{1.73_m2} (ref >59)

## 2022-01-28 LAB — LIPID PANEL
Chol/HDL Ratio: 6.2 ratio — ABNORMAL HIGH (ref 0.0–5.0)
Cholesterol, Total: 206 mg/dL — ABNORMAL HIGH (ref 100–199)
HDL: 33 mg/dL — ABNORMAL LOW (ref >39)
LDL Chol Calc (NIH): 151 mg/dL — ABNORMAL HIGH (ref 0–99)
Triglycerides: 118 mg/dL (ref 0–149)
VLDL Cholesterol Cal: 22 mg/dL (ref 5–40)

## 2022-01-28 LAB — VITAMIN D 25 HYDROXY (VIT D DEFICIENCY, FRACTURES): Vit D, 25-Hydroxy: 28.5 ng/mL — ABNORMAL LOW (ref 30.0–100.0)

## 2022-01-28 LAB — T4, FREE: Free T4: 1.09 ng/dL (ref 0.82–1.77)

## 2022-01-28 LAB — TSH: TSH: 3.49 u[IU]/mL (ref 0.450–4.500)

## 2022-01-31 ENCOUNTER — Encounter: Payer: Self-pay | Admitting: Internal Medicine

## 2022-01-31 ENCOUNTER — Ambulatory Visit (INDEPENDENT_AMBULATORY_CARE_PROVIDER_SITE_OTHER): Payer: 59 | Admitting: Internal Medicine

## 2022-01-31 VITALS — BP 118/88 | HR 74 | Resp 18 | Ht 68.0 in | Wt 221.0 lb

## 2022-01-31 DIAGNOSIS — Z0001 Encounter for general adult medical examination with abnormal findings: Secondary | ICD-10-CM

## 2022-01-31 DIAGNOSIS — E559 Vitamin D deficiency, unspecified: Secondary | ICD-10-CM

## 2022-01-31 DIAGNOSIS — R221 Localized swelling, mass and lump, neck: Secondary | ICD-10-CM

## 2022-01-31 DIAGNOSIS — E1165 Type 2 diabetes mellitus with hyperglycemia: Secondary | ICD-10-CM

## 2022-01-31 DIAGNOSIS — E782 Mixed hyperlipidemia: Secondary | ICD-10-CM

## 2022-01-31 NOTE — Assessment & Plan Note (Signed)
Lipid profile reviewed On statin, but does not take it regularly due to leg pain, advised to take it QD for now Advised to take CoQ10 to avoid leg cramping

## 2022-01-31 NOTE — Assessment & Plan Note (Signed)

## 2022-01-31 NOTE — Addendum Note (Signed)
Addended by: Zacarias Pontes R on: 01/31/2022 02:26 PM   Modules accepted: Orders

## 2022-01-31 NOTE — Assessment & Plan Note (Signed)
Lab Results  Component Value Date   HGBA1C 10.1 (A) 10/26/2021   Improved from 01.4 On Trulicity and Lantus 30 U qHS Followed by Endocrinology Advised to follow diabetic diet On statin Diabetic eye exam: Advised to follow up with Ophthalmology for diabetic eye exam

## 2022-01-31 NOTE — Patient Instructions (Signed)
Please continue taking medications as prescribed.  Please take Vitamin D 2000 IU once daily and CoQ10 100 mg once daily.  Please continue to follow low carb diet and perform moderate exercise/walking at least 150 mins/week.
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
## 2022-01-31 NOTE — Assessment & Plan Note (Signed)
Last vitamin D Lab Results  Component Value Date   VD25OH 28.5 (L) 01/27/2022   Advised to take Vitamin D 2000 IU QD

## 2022-01-31 NOTE — Assessment & Plan Note (Signed)
Recent increase in size Previous US neck reviewed Check US neck to determine stability of size

## 2022-02-01 ENCOUNTER — Encounter: Payer: 59 | Attending: Nurse Practitioner | Admitting: Nutrition

## 2022-02-01 ENCOUNTER — Encounter: Payer: Self-pay | Admitting: Nutrition

## 2022-02-01 VITALS — Ht 68.0 in | Wt 220.0 lb

## 2022-02-01 DIAGNOSIS — Z713 Dietary counseling and surveillance: Secondary | ICD-10-CM | POA: Diagnosis not present

## 2022-02-01 DIAGNOSIS — Z6833 Body mass index (BMI) 33.0-33.9, adult: Secondary | ICD-10-CM | POA: Diagnosis not present

## 2022-02-01 DIAGNOSIS — E559 Vitamin D deficiency, unspecified: Secondary | ICD-10-CM

## 2022-02-01 DIAGNOSIS — E782 Mixed hyperlipidemia: Secondary | ICD-10-CM

## 2022-02-01 DIAGNOSIS — I10 Essential (primary) hypertension: Secondary | ICD-10-CM

## 2022-02-01 DIAGNOSIS — E1165 Type 2 diabetes mellitus with hyperglycemia: Secondary | ICD-10-CM | POA: Insufficient documentation

## 2022-02-01 NOTE — Patient Instructions (Signed)
Goals  Cut out fried foods Increase fresh fruit and vegetables. Keep drinking 80 oz of water per day Walk 15-30 minutes per day as tolerated. Lose 1 lb per week. Get A1C down to 7% or below.

## 2022-02-01 NOTE — Progress Notes (Signed)
Medical Nutrition Therapy  Appointment Start time:  3419 Appointment End time:  1400  Primary concerns today: Type 2 DM  Referral diagnosis: E11.8 Preferred learning style: No preference. Learning readiness: Change in progress   NUTRITION ASSESSMENT  DM Follow up He is still not back to work yet. He is eating much better and making better choices. He feels better.  FBS 130-140's.. Night time 140's. Willing to start walking some when he can depending on his back. Taking 30 units of Lantus at night   Scheduled to see Rayetta Pigg, FNP next week for A1C. His eating habits and BS are improved since last visit.  Anthropometrics  Wt Readings from Last 3 Encounters:  01/31/22 221 lb (100.2 kg)  12/02/21 219 lb (99.3 kg)  10/26/21 218 lb 12.8 oz (99.2 kg)   Ht Readings from Last 3 Encounters:  01/31/22 '5\' 8"'$  (1.727 m)  12/02/21 '5\' 8"'$  (1.727 m)  10/26/21 '5\' 8"'$  (1.727 m)   There is no height or weight on file to calculate BMI. '@BMIFA'$ @ Facility age limit for growth %iles is 20 years. Facility age limit for growth %iles is 20 years.    Clinical Medical Hx: see chart Medications: Lantus 30 units now. Had been up to 50 units and was having some low blood sugars.  Labs:  Lab Results  Component Value Date   HGBA1C 10.1 (A) 10/26/2021   Lipid Panel     Component Value Date/Time   CHOL 206 (H) 01/27/2022 0913   TRIG 118 01/27/2022 0913   HDL 33 (L) 01/27/2022 0913   CHOLHDL 6.2 (H) 01/27/2022 0913   CHOLHDL 5.9 (H) 07/28/2019 1030   VLDL 21 08/27/2014 0852   LDLCALC 151 (H) 01/27/2022 0913   LDLCALC 164 (H) 07/28/2019 1030   LABVLDL 22 01/27/2022 0913    Notable Signs/Symptoms: None  Lifestyle & Dietary Hx Married. Does cleaning business at night. He does most of the cooking at home. Wife dieting right now. Has had some back issues.  Estimated daily fluid intake: 80 oz water Supplements: MVI Sleep: 7 hours Stress / self-care: none Current average weekly physical  activity: working  24-Hr Dietary Recall First Meal: Skipped:   Snack:  Second Meal: Small fry and oatmeal cookie Snack:  Third Meal: Fish or chicken,  greens, apples, water Snack:  Beverages: water   Eating more baked salmon, some red meat, Estimated Energy Needs Calories: 1800-2000 Carbohydrate: 200g Protein: 135g Fat: 50g   NUTRITION DIAGNOSIS  NB-1.1 Food and nutrition-related knowledge deficit As related to Diabetes Type 2.  As evidenced by A1C 9%.   NUTRITION INTERVENTION  Nutrition education (E-1) on the following topics:  Nutrition and Diabetes education provided on My Plate, CHO counting, meal planning, portion sizes, timing of meals, avoiding snacks between meals unless having a low blood sugar, target ranges for A1C and blood sugars, signs/symptoms and treatment of hyper/hypoglycemia, monitoring blood sugars, taking medications as prescribed, benefits of exercising 30 minutes per day and prevention of complications of DM. Lifestyle Medicine - Whole Food, Plant Predominant Nutrition is highly recommended: Eat Plenty of vegetables, Mushrooms, fruits, Legumes, Whole Grains, Nuts, seeds in lieu of processed meats, processed snacks/pastries red meat, poultry, eggs.    -It is better to avoid simple carbohydrates including: Cakes, Sweet Desserts, Ice Cream, Soda (diet and regular), Sweet Tea, Candies, Chips, Cookies, Store Bought Juices, Alcohol in Excess of  1-2 drinks a day, Lemonade,  Artificial Sweeteners, Doughnuts, Coffee Creamers, "Sugar-free" Products, etc, etc.  This is not  a complete list.....  Exercise: If you are able: 30 -60 minutes a day ,4 days a week, or 150 minutes a week.  The longer the better.  Combine stretch, strength, and aerobic activities.  If you were told in the past that you have high risk for cardiovascular diseases, you may seek evaluation by your heart doctor prior to initiating moderate to intense exercise programs.  Handouts Provided Include   LIvestyle medicine plant based meal plan Meal Plan Card Calorie Density of foods  Learning Style & Readiness for Change Teaching method utilized: Visual & Auditory  Demonstrated degree of understanding via: Teach Back  Barriers to learning/adherence to lifestyle change: none  Goals Established by Pt Goals  Cut out fried foods Increase fresh fruit and vegetables. Keep drinking 80 oz of water per day Walk 15-30 minutes per day as tolerated. Lose 1 lb per week. Get A1C down to 7% or below.   MONITORING & EVALUATION Dietary intake, weekly physical activity, and blood sugars  in 3 month.  Next Steps  Patient is to work on better eating habits and timing of meals.Marland Kitchen

## 2022-02-02 LAB — MICROALBUMIN / CREATININE URINE RATIO
Creatinine, Urine: 180 mg/dL
Microalb/Creat Ratio: 5 mg/g creat (ref 0–29)
Microalbumin, Urine: 9.8 ug/mL

## 2022-02-08 ENCOUNTER — Encounter: Payer: Self-pay | Admitting: Nurse Practitioner

## 2022-02-08 ENCOUNTER — Ambulatory Visit (INDEPENDENT_AMBULATORY_CARE_PROVIDER_SITE_OTHER): Payer: 59 | Admitting: Nurse Practitioner

## 2022-02-08 VITALS — BP 138/89 | HR 76 | Ht 68.0 in | Wt 225.0 lb

## 2022-02-08 DIAGNOSIS — E1165 Type 2 diabetes mellitus with hyperglycemia: Secondary | ICD-10-CM | POA: Diagnosis not present

## 2022-02-08 DIAGNOSIS — I10 Essential (primary) hypertension: Secondary | ICD-10-CM | POA: Diagnosis not present

## 2022-02-08 DIAGNOSIS — E782 Mixed hyperlipidemia: Secondary | ICD-10-CM | POA: Diagnosis not present

## 2022-02-08 LAB — POCT GLYCOSYLATED HEMOGLOBIN (HGB A1C): HbA1c POC (<> result, manual entry): 9 % (ref 4.0–5.6)

## 2022-02-08 MED ORDER — TRULICITY 3 MG/0.5ML ~~LOC~~ SOAJ: 3.0000 mg | SUBCUTANEOUS | 1 refills | Status: DC

## 2022-02-08 MED ORDER — LANTUS SOLOSTAR 100 UNIT/ML ~~LOC~~ SOPN: 30.0000 [IU] | PEN_INJECTOR | Freq: Every day | SUBCUTANEOUS | 3 refills | Status: DC
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
## 2022-02-13 ENCOUNTER — Emergency Department (HOSPITAL_COMMUNITY): Payer: 59

## 2022-02-13 ENCOUNTER — Encounter (HOSPITAL_COMMUNITY): Payer: Self-pay

## 2022-02-13 ENCOUNTER — Emergency Department (HOSPITAL_COMMUNITY)
Admission: EM | Admit: 2022-02-13 | Discharge: 2022-02-13 | Disposition: A | Discharge status: Home / Self Care | Payer: 59 | Attending: Student | Admitting: Student

## 2022-02-13 ENCOUNTER — Other Ambulatory Visit: Payer: Self-pay

## 2022-02-13 DIAGNOSIS — E119 Type 2 diabetes mellitus without complications: Secondary | ICD-10-CM | POA: Diagnosis not present

## 2022-02-13 DIAGNOSIS — R221 Localized swelling, mass and lump, neck: Secondary | ICD-10-CM | POA: Diagnosis present

## 2022-02-13 DIAGNOSIS — D179 Benign lipomatous neoplasm, unspecified: Secondary | ICD-10-CM | POA: Diagnosis not present

## 2022-02-13 DIAGNOSIS — M5020 Other cervical disc displacement, unspecified cervical region: Secondary | ICD-10-CM | POA: Diagnosis not present

## 2022-02-13 DIAGNOSIS — I10 Essential (primary) hypertension: Secondary | ICD-10-CM | POA: Insufficient documentation

## 2022-02-13 DIAGNOSIS — Z794 Long term (current) use of insulin: Secondary | ICD-10-CM | POA: Diagnosis not present

## 2022-02-13 DIAGNOSIS — Z87891 Personal history of nicotine dependence: Secondary | ICD-10-CM | POA: Insufficient documentation

## 2022-02-13 LAB — COMPREHENSIVE METABOLIC PANEL
ALT: 43 U/L (ref 0–44)
AST: 27 U/L (ref 15–41)
Albumin: 4 g/dL (ref 3.5–5.0)
Alkaline Phosphatase: 44 U/L (ref 38–126)
Anion gap: 6 (ref 5–15)
BUN: 19 mg/dL (ref 6–20)
CO2: 25 mmol/L (ref 22–32)
Calcium: 9.1 mg/dL (ref 8.9–10.3)
Chloride: 105 mmol/L (ref 98–111)
Creatinine, Ser: 1.12 mg/dL (ref 0.61–1.24)
GFR, Estimated: >60 mL/min (ref >60)
Glucose, Bld: 402 mg/dL — ABNORMAL HIGH (ref 70–99)
Potassium: 4 mmol/L (ref 3.5–5.1)
Sodium: 136 mmol/L (ref 135–145)
Total Bilirubin: 0.7 mg/dL (ref 0.3–1.2)
Total Protein: 7.6 g/dL (ref 6.5–8.1)

## 2022-02-13 LAB — CBC WITH DIFFERENTIAL/PLATELET
Abs Immature Granulocytes: 0.01 10*3/uL (ref 0.00–0.07)
Basophils Absolute: 0 10*3/uL (ref 0.0–0.1)
Basophils Relative: 1 %
Eosinophils Absolute: 0.1 10*3/uL (ref 0.0–0.5)
Eosinophils Relative: 2 %
HCT: 39.9 % (ref 39.0–52.0)
Hemoglobin: 13 g/dL (ref 13.0–17.0)
Immature Granulocytes: 0 %
Lymphocytes Relative: 52 %
Lymphs Abs: 3 10*3/uL (ref 0.7–4.0)
MCH: 26.7 pg (ref 26.0–34.0)
MCHC: 32.6 g/dL (ref 30.0–36.0)
MCV: 81.9 fL (ref 80.0–100.0)
Monocytes Absolute: 0.6 10*3/uL (ref 0.1–1.0)
Monocytes Relative: 11 %
Neutro Abs: 1.9 10*3/uL (ref 1.7–7.7)
Neutrophils Relative %: 34 %
Platelets: 197 10*3/uL (ref 150–400)
RBC: 4.87 MIL/uL (ref 4.22–5.81)
RDW: 14.4 % (ref 11.5–15.5)
WBC: 5.6 10*3/uL (ref 4.0–10.5)
nRBC: 0 % (ref 0.0–0.2)

## 2022-02-13 MED ORDER — METHOCARBAMOL 500 MG PO TABS: 1000.0000 mg | ORAL_TABLET | Freq: Two times a day (BID) | ORAL | 0 refills | Status: AC

## 2022-02-13 MED ORDER — ACETAMINOPHEN 325 MG PO TABS
650.0000 mg | ORAL_TABLET | Freq: Once | ORAL | Status: AC
  Administered 2022-02-13: 650 mg via ORAL
  Filled 2022-02-13: qty 2

## 2022-02-13 MED ORDER — IOHEXOL 300 MG/ML  SOLN
75.0000 mL | Freq: Once | INTRAMUSCULAR | Status: AC | PRN
  Administered 2022-02-13: 75 mL via INTRAVENOUS

## 2022-02-13 MED ORDER — IBUPROFEN 600 MG PO TABS: 600.0000 mg | ORAL_TABLET | Freq: Four times a day (QID) | ORAL | 0 refills | Status: DC | PRN

## 2022-02-13 MED ORDER — KETOROLAC TROMETHAMINE 15 MG/ML IJ SOLN
15.0000 mg | Freq: Once | INTRAMUSCULAR | Status: AC
  Administered 2022-02-13: 15 mg via INTRAVENOUS
  Filled 2022-02-13: qty 1

## 2022-02-13 MED ORDER — ACETAMINOPHEN 325 MG PO TABS: 650.0000 mg | ORAL_TABLET | Freq: Four times a day (QID) | ORAL | 0 refills | Status: AC | PRN

## 2022-02-13 MED ORDER — OXYCODONE HCL 5 MG PO TABS: 5.0000 mg | ORAL_TABLET | ORAL | 0 refills | Status: DC | PRN

## 2022-02-13 NOTE — ED Triage Notes (Signed)
Pt presents to ED with c/o "neck pain and knot on neck, posterior, and on left side" pt has had several scans over the past 5 years with ongoing problems with shoulders and neck. Pt says the knot on left side has been there for years, but the one on back of neck is newer and pain got really bad on Monday. Pt is scheduled for another [place] in the next week.
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
## 2022-02-13 NOTE — ED Provider Notes (Signed)
  Provider Note MRN:  [id number]  Arrival date & time: 02/13/22    ED Course and Medical Decision Making  Assumed care from [facility] at shift change.  See note from prior team for complete details, in brief:   51 yo male Lump on neck, dropping items CT w/ lipoma Pending MRI Neuro non-focal, has some mild paresthesias to fingertips  Plan per prior physician f/u mri  MRI with mild cervical disc protrusion  D/w NSGY Dr Zada Finders; recommend f/u in office; no emergent surgical condition today; he has reviewed the imaging F/u with ENT for lipoma  The patient improved significantly and was discharged in stable condition. Detailed discussions were had with the patient regarding current findings, and need for close f/u with PCP or on call doctor. The patient has been instructed to return immediately if the symptoms worsen in any way for re-evaluation. Patient verbalized understanding and is in agreement with current care plan. All questions answered prior to discharge.      Procedures  Final Clinical Impressions(s) / ED Diagnoses     ICD-10-CM   1. Lipoma, unspecified site  D17.9     2. Protrusion of cervical intervertebral disc  M50.20       ED Discharge Orders          Ordered    ibuprofen (ADVIL) 600 MG tablet  Every 6 hours PRN        02/13/22 0957    acetaminophen (TYLENOL) 325 MG tablet  Every 6 hours PRN        02/13/22 0957    methocarbamol (ROBAXIN) 500 MG tablet  2 times daily        02/13/22 0957    oxyCODONE (ROXICODONE) 5 MG immediate release tablet  Every 4 hours PRN        02/13/22 1004              Discharge Instructions      It was a pleasure caring for you today in the emergency department.  Please return to the emergency department for any worsening or worrisome symptoms.  Please follow-up with neurosurgery and ENT           Jeanell Sparrow, DO 02/13/22 1101

## 2022-02-13 NOTE — Discharge Instructions (Addendum)
It was a pleasure caring for you today in the emergency department.  Please return to the emergency department for any worsening or worrisome symptoms.  Please follow-up with neurosurgery and ENT

## 2022-02-17 ENCOUNTER — Ambulatory Visit (HOSPITAL_COMMUNITY): Admission: RE | Admit: 2022-02-17 | Payer: 59 | Source: Ambulatory Visit
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
## 2022-02-23 ENCOUNTER — Ambulatory Visit: Payer: 59 | Admitting: Neurosurgery

## 2022-02-23 VITALS — BP 128/82 | Wt 222.0 lb

## 2022-02-23 DIAGNOSIS — G8929 Other chronic pain: Secondary | ICD-10-CM

## 2022-02-23 DIAGNOSIS — M542 Cervicalgia: Secondary | ICD-10-CM | POA: Diagnosis not present

## 2022-02-23 DIAGNOSIS — M5442 Lumbago with sciatica, left side: Secondary | ICD-10-CM | POA: Diagnosis not present

## 2022-04-10 ENCOUNTER — Ambulatory Visit (HOSPITAL_COMMUNITY): Payer: 59 | Attending: Neurosurgery | Admitting: Physical Therapy

## 2022-04-12 ENCOUNTER — Telehealth: Payer: Self-pay | Admitting: Internal Medicine

## 2022-04-12 ENCOUNTER — Telehealth: Payer: Self-pay | Admitting: Nurse Practitioner

## 2022-04-12 ENCOUNTER — Other Ambulatory Visit: Payer: Self-pay | Admitting: Nurse Practitioner

## 2022-04-12 ENCOUNTER — Other Ambulatory Visit: Payer: Self-pay | Admitting: *Deleted

## 2022-04-12 DIAGNOSIS — E1165 Type 2 diabetes mellitus with hyperglycemia: Secondary | ICD-10-CM

## 2022-04-12 DIAGNOSIS — G43819 Other migraine, intractable, without status migrainosus: Secondary | ICD-10-CM

## 2022-04-12 DIAGNOSIS — I10 Essential (primary) hypertension: Secondary | ICD-10-CM

## 2022-04-12 MED ORDER — LANTUS SOLOSTAR 100 UNIT/ML ~~LOC~~ SOPN: 30.0000 [IU] | PEN_INJECTOR | Freq: Every day | SUBCUTANEOUS | 3 refills | Status: DC

## 2022-04-12 MED ORDER — TRULICITY 3 MG/0.5ML ~~LOC~~ SOAJ: 3.0000 mg | SUBCUTANEOUS | 1 refills | Status: DC

## 2022-04-12 MED ORDER — BASAGLAR KWIKPEN 100 UNIT/ML ~~LOC~~ SOPN: 30.0000 [IU] | PEN_INJECTOR | Freq: Every day | SUBCUTANEOUS | 3 refills | Status: DC

## 2022-04-12 MED ORDER — RIZATRIPTAN BENZOATE 10 MG PO TBDP: ORAL_TABLET | ORAL | 0 refills | Status: AC

## 2022-04-12 MED ORDER — ATORVASTATIN CALCIUM 40 MG PO TABS: 40.0000 mg | ORAL_TABLET | Freq: Every day | ORAL | 1 refills | Status: DC

## 2022-04-12 NOTE — Telephone Encounter (Signed)
Pt needs new RX for his Lantus and his Trulicity. He has new insurance and they need new RX's. [facility] [place]

## 2022-04-12 NOTE — Telephone Encounter (Signed)
done

## 2022-04-12 NOTE — Telephone Encounter (Signed)
Medications sent to [facility] [place]

## 2022-04-12 NOTE — Telephone Encounter (Signed)
Pt wife called stating since they have had to change insu that his previous phar will not transfer his medications. Wants to know if you can please transfer his active medications from Dr. Posey Pronto to [place]?     [facility] [place]

## 2022-04-14 ENCOUNTER — Other Ambulatory Visit: Payer: Self-pay | Admitting: Nurse Practitioner

## 2022-04-17 ENCOUNTER — Telehealth: Payer: Self-pay | Admitting: *Deleted

## 2022-04-17 NOTE — Telephone Encounter (Signed)
We rec'd a request for refills from the [facility] in [place], this was for his 2 inulins. Patient had reached out to [place] , stating that  his [facility]ul[facility] [place] due to his new Insurance. This was done on 04/12/2022.

## 2022-04-20 NOTE — Telephone Encounter (Signed)
Pt's wife called and said he has not taken his Trulicity in 3 weeks because the pharmacy said it needs a PA. The number to start this PA is [phone], she said the Lantus needs a PA too.

## 2022-04-25 ENCOUNTER — Other Ambulatory Visit (HOSPITAL_COMMUNITY): Payer: Self-pay

## 2022-04-25 ENCOUNTER — Ambulatory Visit: Payer: 59 | Admitting: Neurosurgery

## 2022-04-25 NOTE — Telephone Encounter (Signed)
Patient Advocate Encounter   Received notification that prior authorization for Trulicity '3MG'$ /0.5ML pen-injectors is required/requested.   PA submitted on 04/25/2022 to [place] via [place]  Status is pending

## 2022-04-25 NOTE — Telephone Encounter (Signed)
F/u   Patient wife is calling inquiring about prior authorization .   Wife has questions regarding Insulin Glargine (BASAGLAR KWIKPEN) 100 UNIT/ML   Asking for a call back to discuss.

## 2022-04-25 NOTE — Telephone Encounter (Signed)
Wonderful, thanks!  

## 2022-04-25 NOTE — Telephone Encounter (Signed)
Patient Advocate Encounter  Prior Authorization for Trulicity '3MG'$ /0.5ML pen-injectors has been approved.    PA#  [id number]  Key: [id number]  Effective dates: 04/25/2022 through 04/26/2023

## 2022-04-26 ENCOUNTER — Other Ambulatory Visit (HOSPITAL_COMMUNITY): Payer: Self-pay

## 2022-05-03 ENCOUNTER — Other Ambulatory Visit: Payer: Self-pay

## 2022-05-03 MED ORDER — TRULICITY 3 MG/0.5ML ~~LOC~~ SOAJ: 3.0000 mg | SUBCUTANEOUS | 1 refills | Status: DC

## 2022-05-11 ENCOUNTER — Encounter: Payer: Self-pay | Admitting: Nurse Practitioner

## 2022-05-11 ENCOUNTER — Ambulatory Visit (INDEPENDENT_AMBULATORY_CARE_PROVIDER_SITE_OTHER): Payer: 59 | Admitting: Nurse Practitioner

## 2022-05-11 VITALS — BP 102/72 | HR 86 | Ht 68.0 in | Wt 216.2 lb

## 2022-05-11 DIAGNOSIS — I10 Essential (primary) hypertension: Secondary | ICD-10-CM | POA: Diagnosis not present

## 2022-05-11 DIAGNOSIS — E782 Mixed hyperlipidemia: Secondary | ICD-10-CM

## 2022-05-11 DIAGNOSIS — E1165 Type 2 diabetes mellitus with hyperglycemia: Secondary | ICD-10-CM | POA: Diagnosis not present
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
## 2022-05-16 ENCOUNTER — Encounter (HOSPITAL_COMMUNITY): Payer: Self-pay | Admitting: Physical Therapy

## 2022-05-16 ENCOUNTER — Ambulatory Visit (HOSPITAL_COMMUNITY): Payer: 59 | Attending: Neurosurgery | Admitting: Physical Therapy

## 2022-05-16 DIAGNOSIS — M545 Low back pain, unspecified: Secondary | ICD-10-CM | POA: Insufficient documentation

## 2022-05-16 DIAGNOSIS — M542 Cervicalgia: Secondary | ICD-10-CM | POA: Diagnosis not present

## 2022-05-16 DIAGNOSIS — G8929 Other chronic pain: Secondary | ICD-10-CM | POA: Diagnosis not present

## 2022-05-16 DIAGNOSIS — M5442 Lumbago with sciatica, left side: Secondary | ICD-10-CM | POA: Diagnosis not present
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
## 2022-05-22 ENCOUNTER — Encounter (HOSPITAL_COMMUNITY): Payer: 59 | Admitting: Physical Therapy

## 2022-05-24 ENCOUNTER — Ambulatory Visit (HOSPITAL_COMMUNITY): Payer: 59 | Admitting: Physical Therapy

## 2022-05-24 DIAGNOSIS — G8929 Other chronic pain: Secondary | ICD-10-CM | POA: Diagnosis not present

## 2022-05-24 DIAGNOSIS — M545 Low back pain, unspecified: Secondary | ICD-10-CM | POA: Diagnosis not present

## 2022-05-24 DIAGNOSIS — E113293 Type 2 diabetes mellitus with mild nonproliferative diabetic retinopathy without macular edema, bilateral: Secondary | ICD-10-CM | POA: Diagnosis not present

## 2022-05-24 DIAGNOSIS — M542 Cervicalgia: Secondary | ICD-10-CM

## 2022-05-24 DIAGNOSIS — M5442 Lumbago with sciatica, left side: Secondary | ICD-10-CM | POA: Diagnosis not present
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
## 2022-05-29 ENCOUNTER — Ambulatory Visit (HOSPITAL_COMMUNITY): Payer: 59 | Admitting: Physical Therapy

## 2022-05-29 DIAGNOSIS — M542 Cervicalgia: Secondary | ICD-10-CM | POA: Diagnosis not present

## 2022-05-29 DIAGNOSIS — G8929 Other chronic pain: Secondary | ICD-10-CM | POA: Diagnosis not present

## 2022-05-29 DIAGNOSIS — M545 Low back pain, unspecified: Secondary | ICD-10-CM

## 2022-05-29 DIAGNOSIS — M5442 Lumbago with sciatica, left side: Secondary | ICD-10-CM | POA: Diagnosis not present
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
## 2022-05-30 ENCOUNTER — Encounter: Payer: Self-pay | Admitting: Internal Medicine

## 2022-05-30 ENCOUNTER — Ambulatory Visit: Payer: 59 | Admitting: Internal Medicine

## 2022-05-30 DIAGNOSIS — R0981 Nasal congestion: Secondary | ICD-10-CM | POA: Diagnosis not present

## 2022-05-30 NOTE — Progress Notes (Signed)
   Acute Telephone Visit  Virtual Visit via Telephone Note  I connected with Travis Delgado on 05/30/22 at  4:40 PM EST by telephone and verified that I am speaking with the correct person using two identifiers.  Location: Patient: [address] [place], [place] [address] Provider: [phone], [place], [place] [address]   I discussed the limitations, risks, security and privacy concerns of performing an evaluation and management service by telephone and the availability of in person appointments. I also discussed with the patient that there may be a patient responsible charge related to this service. The patient expressed understanding and agreed to proceed.   History of Present Illness:  Mr. Sasso was evaluated today via acute telephone encounter for symptoms of sinus congestion, headache, and throat irritation.  He reports onset of symptoms 2 days ago after blowing leaves in his yard.  He currently endorses a headache, frontal sinus pressure, throat irritation, and nasal drainage that is occasionally green with specks of blood.  He currently denies fever/chills and cough.  His son has had similar symptoms recently.  He has tried taking Alka-Seltzer gelcaps for symptom relief without sustained improvement.  Assessment and Plan:  Sinus Congestion Seasonal Allergies He was evaluated today for the symptoms noted above.  Symptom onset was preceded by blowing leaves in his yard.  His symptoms are likely attributable to seasonal allergies.  He denies fever/chills currently.  I recommended supportive care measures for now, which include use of a daily antihistamine, nasal saline rinse followed by fluticasone, Tylenol for pain relief, and a nasal decongestant such as Sudafed.  I instructed him to return to care within 1 week if his symptoms do not improve, he develops fever/chills or cough, or if the quality and quantity of his nasal drainage increases.  Follow Up Instructions:  I discussed the  assessment and treatment plan with the patient. The patient was provided an opportunity to ask questions and all were answered. The patient agreed with the plan and demonstrated an understanding of the instructions.   The patient was advised to call back or seek an in-person evaluation if the symptoms worsen or if the condition fails to improve as anticipated.  I provided 7 minutes of non-face-to-face time during this encounter.   Johnette Abraham, MD

## 2022-06-06 ENCOUNTER — Encounter (HOSPITAL_COMMUNITY): Payer: 59 | Admitting: Physical Therapy

## 2022-06-08 ENCOUNTER — Encounter (HOSPITAL_COMMUNITY): Payer: 59 | Admitting: Physical Therapy

## 2022-06-13 ENCOUNTER — Encounter (HOSPITAL_COMMUNITY): Payer: 59 | Admitting: Physical Therapy

## 2022-06-15 ENCOUNTER — Telehealth (HOSPITAL_COMMUNITY): Payer: Self-pay | Admitting: Physical Therapy

## 2022-06-15 ENCOUNTER — Encounter (HOSPITAL_COMMUNITY): Payer: Self-pay | Admitting: Physical Therapy

## 2022-06-15 ENCOUNTER — Encounter (HOSPITAL_COMMUNITY): Payer: 59 | Admitting: Physical Therapy

## 2022-06-15 NOTE — Telephone Encounter (Signed)
Pt did not show today nor his last visit on 12/5.  2 cancellations prior to that.  Pt does not have anymore appointments and per NS/CX policy is now discharged.  Teena Irani, PTA/CLT [place] Ph: [phone]

## 2022-07-13 ENCOUNTER — Ambulatory Visit: Payer: 59 | Admitting: Neurosurgery

## 2022-08-11 ENCOUNTER — Encounter: Payer: Self-pay | Admitting: Nurse Practitioner

## 2022-08-11 ENCOUNTER — Ambulatory Visit: Payer: 59 | Admitting: Nurse Practitioner

## 2022-08-11 VITALS — BP 122/79 | HR 84 | Ht 68.0 in | Wt 222.4 lb

## 2022-08-11 DIAGNOSIS — I10 Essential (primary) hypertension: Secondary | ICD-10-CM | POA: Diagnosis not present

## 2022-08-11 DIAGNOSIS — E782 Mixed hyperlipidemia: Secondary | ICD-10-CM

## 2022-08-11 DIAGNOSIS — E1165 Type 2 diabetes mellitus with hyperglycemia: Secondary | ICD-10-CM

## 2022-08-11 LAB — HEMOGLOBIN A1C: Hemoglobin A1C: 8.8

## 2022-08-11 MED ORDER — TIRZEPATIDE 5 MG/0.5ML ~~LOC~~ SOAJ: 5.0000 mg | SUBCUTANEOUS | 0 refills | Status: DC
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
## 2022-09-07 ENCOUNTER — Encounter: Payer: Self-pay | Admitting: Radiology

## 2022-09-11 ENCOUNTER — Encounter: Payer: Self-pay | Admitting: Nurse Practitioner

## 2022-09-11 NOTE — Telephone Encounter (Signed)
Can you check to see if PA was requested for his Mounjaro 5 mg SQ weekly ordered at last visit 08/11/22.

## 2022-09-12 ENCOUNTER — Telehealth: Payer: Self-pay

## 2022-09-12 NOTE — Telephone Encounter (Signed)
PA request received via CMM/provider for Mounjaro '5MG'$ /0.5ML pen-injectors  PA submitted to [facility] and is pending determination.  Key: Roy

## 2022-09-12 NOTE — Telephone Encounter (Signed)
PA has been submitted for [facility] and is pending determination, will be updated in additional encounter created.

## 2022-09-19 NOTE — Telephone Encounter (Signed)
Received fax from [employer] ins for additional information. We also received a denial letter 2 days after the initial fax. Since we weren't able to get to it in time, they denied it. Hopefully they will reopen and approve, since I faxed over the form filled out, along with office notes. Looks like he meets the criteria on the denial letter.

## 2022-09-21 NOTE — Telephone Encounter (Signed)
Patient's wife is asking for an update on this. Please Advise  She said the insurance said that you need to call the provider line.

## 2022-09-22 NOTE — Telephone Encounter (Signed)
Can you check on the status of his PA for [facility]?

## 2022-09-25 ENCOUNTER — Other Ambulatory Visit (HOSPITAL_COMMUNITY): Payer: Self-pay

## 2022-09-25 NOTE — Telephone Encounter (Signed)
Looks like it was approved until next year, he should be able to pick up his script.

## 2022-09-27 NOTE — Telephone Encounter (Signed)
Patient was called and a message was left on his voicemail.

## 2022-11-09 ENCOUNTER — Encounter: Payer: Self-pay | Admitting: Nurse Practitioner

## 2022-11-09 ENCOUNTER — Ambulatory Visit (INDEPENDENT_AMBULATORY_CARE_PROVIDER_SITE_OTHER): Payer: 59 | Admitting: Nurse Practitioner

## 2022-11-09 VITALS — BP 118/68 | HR 80 | Ht 68.0 in | Wt 217.2 lb

## 2022-11-09 DIAGNOSIS — E782 Mixed hyperlipidemia: Secondary | ICD-10-CM | POA: Diagnosis not present

## 2022-11-09 DIAGNOSIS — E1165 Type 2 diabetes mellitus with hyperglycemia: Secondary | ICD-10-CM | POA: Diagnosis not present

## 2022-11-09 DIAGNOSIS — I10 Essential (primary) hypertension: Secondary | ICD-10-CM | POA: Diagnosis not present

## 2022-11-09 LAB — LIPID PANEL
Chol/HDL Ratio: 6.8 ratio — ABNORMAL HIGH (ref 0.0–5.0)
Cholesterol, Total: 257 mg/dL — ABNORMAL HIGH (ref 100–199)
HDL: 38 mg/dL — ABNORMAL LOW (ref >39)
LDL Chol Calc (NIH): 188 mg/dL — ABNORMAL HIGH (ref 0–99)
Triglycerides: 167 mg/dL — ABNORMAL HIGH (ref 0–149)
VLDL Cholesterol Cal: 31 mg/dL (ref 5–40)

## 2022-11-09 LAB — POCT GLYCOSYLATED HEMOGLOBIN (HGB A1C): Hemoglobin A1C: 9 % — AB (ref 4.0–5.6)

## 2022-11-09 LAB — COMPREHENSIVE METABOLIC PANEL
ALT: 34 IU/L (ref 0–44)
AST: 26 IU/L (ref 0–40)
Albumin/Globulin Ratio: 1.6 (ref 1.2–2.2)
Albumin: 4.6 g/dL (ref 3.8–4.9)
Alkaline Phosphatase: 53 IU/L (ref 44–121)
BUN/Creatinine Ratio: 14 (ref 9–20)
BUN: 13 mg/dL (ref 6–24)
Bilirubin Total: 0.5 mg/dL (ref 0.0–1.2)
CO2: 19 mmol/L — ABNORMAL LOW (ref 20–29)
Calcium: 9.6 mg/dL (ref 8.7–10.2)
Chloride: 104 mmol/L (ref 96–106)
Creatinine, Ser: 0.94 mg/dL (ref 0.76–1.27)
Globulin, Total: 2.9 g/dL (ref 1.5–4.5)
Glucose: 161 mg/dL — ABNORMAL HIGH (ref 70–99)
Potassium: 4.1 mmol/L (ref 3.5–5.2)
Sodium: 140 mmol/L (ref 134–144)
Total Protein: 7.5 g/dL (ref 6.0–8.5)
eGFR: 98 mL/min/{1.73_m2} (ref >59)

## 2022-11-09 LAB — T4, FREE: Free T4: 1.28 ng/dL (ref 0.82–1.77)

## 2022-11-09 LAB — VITAMIN D 25 HYDROXY (VIT D DEFICIENCY, FRACTURES): Vit D, 25-Hydroxy: 51.1 ng/mL (ref 30.0–100.0)

## 2022-11-09 LAB — TSH: TSH: 1.66 u[IU]/mL (ref 0.450–4.500)

## 2022-11-09 MED ORDER — TIRZEPATIDE 5 MG/0.5ML ~~LOC~~ SOAJ: 5.0000 mg | SUBCUTANEOUS | 0 refills | Status: DC

## 2022-11-09 MED ORDER — ATORVASTATIN CALCIUM 40 MG PO TABS
40.0000 mg | ORAL_TABLET | Freq: Every day | ORAL | 1 refills | Status: AC
Start: 2022-11-09 — End: ?

## 2022-11-09 MED ORDER — FREESTYLE LIBRE 3 SENSOR MISC: 3 refills | Status: DC

## 2022-11-09 MED ORDER — BASAGLAR KWIKPEN 100 UNIT/ML ~~LOC~~ SOPN: 30.0000 [IU] | PEN_INJECTOR | Freq: Every day | SUBCUTANEOUS | 3 refills | Status: DC
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
## 2022-11-22 ENCOUNTER — Encounter: Payer: Self-pay | Admitting: Nurse Practitioner

## 2023-02-05 ENCOUNTER — Encounter: Payer: Self-pay | Admitting: Internal Medicine

## 2023-02-06 NOTE — Telephone Encounter (Signed)
scheduled

## 2023-02-14 ENCOUNTER — Encounter: Payer: Self-pay | Admitting: Internal Medicine

## 2023-02-14 ENCOUNTER — Ambulatory Visit: Payer: 59 | Admitting: Internal Medicine

## 2023-02-14 VITALS — BP 119/80 | HR 84 | Ht 68.0 in | Wt 221.8 lb

## 2023-02-14 DIAGNOSIS — M7031 Other bursitis of elbow, right elbow: Secondary | ICD-10-CM | POA: Diagnosis not present

## 2023-02-14 DIAGNOSIS — M7711 Lateral epicondylitis, right elbow: Secondary | ICD-10-CM | POA: Insufficient documentation

## 2023-02-14 DIAGNOSIS — E782 Mixed hyperlipidemia: Secondary | ICD-10-CM | POA: Diagnosis not present

## 2023-02-14 DIAGNOSIS — F331 Major depressive disorder, recurrent, moderate: Secondary | ICD-10-CM | POA: Insufficient documentation

## 2023-02-14 DIAGNOSIS — Z794 Long term (current) use of insulin: Secondary | ICD-10-CM

## 2023-02-14 DIAGNOSIS — E1165 Type 2 diabetes mellitus with hyperglycemia: Secondary | ICD-10-CM | POA: Diagnosis not present

## 2023-02-14 DIAGNOSIS — G720 Drug-induced myopathy: Secondary | ICD-10-CM

## 2023-02-14 MED ORDER — IBUPROFEN 600 MG PO TABS
600.0000 mg | ORAL_TABLET | Freq: Three times a day (TID) | ORAL | 1 refills | Status: DC | PRN
Start: 2023-02-14 — End: 2023-07-31

## 2023-02-14 MED ORDER — SERTRALINE HCL 25 MG PO TABS
25.0000 mg | ORAL_TABLET | Freq: Every day | ORAL | 1 refills | Status: DC
Start: 2023-02-14 — End: 2023-04-13

## 2023-02-14 MED ORDER — PRALUENT 150 MG/ML ~~LOC~~ SOAJ
150.0000 mg | SUBCUTANEOUS | 2 refills | Status: DC
Start: 2023-02-14 — End: 2023-04-19

## 2023-02-14 MED ORDER — IBUPROFEN 600 MG PO TABS
600.0000 mg | ORAL_TABLET | Freq: Three times a day (TID) | ORAL | 1 refills | Status: DC | PRN
Start: 2023-02-14 — End: 2023-02-14

## 2023-02-14 NOTE — Assessment & Plan Note (Addendum)
Flowsheet Row Office Visit from 02/14/2023 in [facility]  PHQ-9 Total Score 10      Started Zoloft 25 mg QD [facility] therapy referral, but he prefers to wait for now

## 2023-02-14 NOTE — Assessment & Plan Note (Signed)
Lipid profile reviewed On statin, but does not take it regularly due to leg pain Has tried Crestor, Lipitor and pravastatin Has constant leg pain/myalgias with them Started Praluent

## 2023-02-14 NOTE — Assessment & Plan Note (Signed)
Pain and swelling of right elbow likely due to elbow bursitis Ibuprofen as needed for pain Advised to apply ice Referred to orthopedic surgery as he has severe pain and swelling

## 2023-02-14 NOTE — Assessment & Plan Note (Signed)
Has tried Crestor, Lipitor and pravastatin Has constant leg pain/myalgias with them Started Praluent

## 2023-02-14 NOTE — Assessment & Plan Note (Signed)
Lab Results  Component Value Date   HGBA1C 9.0 (A) 11/09/2022   Uncontrolled Improved from 10.1 On Mounjaro and Lantus 30 U qHS Followed by Endocrinology Advised to follow diabetic diet On statin, but has severe myalgia with it - started Praluent Diabetic eye exam: Advised to follow up with Ophthalmology for diabetic eye exam
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
## 2023-02-14 NOTE — Patient Instructions (Signed)
Please start taking Ibuprofen as needed for elbow pain.  Please start taking Zoloft as prescribed.  Please continue to take other medications as prescribed.  Please continue to follow low carb diet and perform moderate exercise/walking at least 150 mins/week.

## 2023-02-18 ENCOUNTER — Other Ambulatory Visit: Payer: Self-pay | Admitting: Internal Medicine

## 2023-02-18 DIAGNOSIS — E782 Mixed hyperlipidemia: Secondary | ICD-10-CM

## 2023-02-21 ENCOUNTER — Ambulatory Visit: Payer: 59 | Admitting: Orthopedic Surgery

## 2023-02-21 ENCOUNTER — Encounter: Payer: Self-pay | Admitting: Orthopedic Surgery

## 2023-02-21 VITALS — BP 128/92 | HR 79 | Ht 68.0 in | Wt 223.0 lb

## 2023-02-21 DIAGNOSIS — M7711 Lateral epicondylitis, right elbow: Secondary | ICD-10-CM

## 2023-02-21 NOTE — Patient Instructions (Signed)
Your pain is consistent with tennis elbow.  Repetitive use.  Recommend you continue to take ibuprofen every 8 hours for the next week.  After that, continue take the medicine as needed.  You can use ice on the part of the elbow which is painful.  He can also use of creams, sprays, lotions or patches.  I recommend Voltaren gel.  This is available over-the-counter at the pharmacy.  If your pain does not continue to improve, you can return to clinic for injection at the site of the pain.   Tennis Elbow Rehab Do exercises exactly as told by your health care provider and adjust them as directed. It is normal to feel mild stretching, pulling, tightness, or discomfort as you do these exercises. Stop right away if you feel sudden pain or your pain gets worse.   Stretching and range-of-motion exercises These exercises warm up your muscles and joints and improve the movement andflexibility of your elbow. Wrist flexion, assisted  Straighten your left / right elbow in front of you with your palm facing down toward the floor. If told by your health care provider, bend your left / right elbow to a 90-degree angle (right angle) at your side instead of holding it straight. With your other hand, gently push over the back of your left / right hand so your fingers point toward the floor (flexion). Stop when you feel a gentle stretch on the back of your forearm. Hold this position for 10 seconds. Repeat 10 times. Complete this exercise 1-2 times a day. Wrist extension, assisted  Straighten your left / right elbow in front of you with your palm facing up toward the ceiling. If told by your health care provider, bend your left / right elbow to a 90-degree angle (right angle) at your side instead of holding it straight. With your other hand, gently pull your left / right hand and fingers toward the floor (extension). Stop when you feel a gentle stretch on the palm side of your forearm. Hold this position for 10  seconds. Repeat 10 times. Complete this exercise 1-2 times a day. Assisted forearm rotation, supination Sit or stand with your elbows at your side. Bend your left / right elbow to a 90-degree angle (right angle). Using your uninjured hand, turn your left / right palm up toward the ceiling (supination) until you feel a gentle stretch along the inside of your forearm. Hold this position for 10 seconds. Repeat 10 times. Complete this exercise 1-2 times a day. Assisted forearm rotation, pronation Sit or stand with your elbows at your side. Bend your left / right elbow to a 90-degree angle (right angle). Using your uninjured hand, turn your left / right palm down toward the floor (pronation) until you feel a gentle stretch along the outside of your forearm. Hold this position for 10 seconds. Repeat 10 times. Complete this exercise 1-2 times a day. Strengthening exercises These exercises build strength and endurance in your forearm and elbow. Endurance is the ability to use your muscles for a long time, even after theyget tired. Radial deviation  Stand with a 5 lbs weight or a hammer in your left / right hand. Or, sit while holding a rubber exercise band or tubing, with your left / right forearm supported on a table or countertop. Position your forearm so that the thumb is facing the ceiling, as if you are going to clap your hands. This is the neutral position. Raise your hand upward in front of you  so your thumb moves toward the ceiling (radial deviation), or pull up on the rubber tubing. Keep your forearm and elbow still while you move your wrist only. Hold this position for 10 seconds. Slowly return to the starting position. Repeat 10 times. Complete this exercise 1-2 times a day. Wrist extension, eccentric Sit with your left / right forearm palm-down and supported on a table or other surface. Let your left / right wrist extend over the edge of the surface. Hold a 5 lbs (can of soup) weight or  a piece of exercise band or tubing in your left / right hand. If using a rubber exercise band or tubing, hold the other end of the tubing with your other hand. Use your uninjured hand to move your left / right hand up toward the ceiling. Take your uninjured hand away and slowly return to the starting position using only your left / right hand. Lowering your arm under tension is called eccentric extension. Repeat 10 times. Complete this exercise 1-2 times a day. Wrist extension  Do not do this exercise if it causes pain at the outside of your elbow. Only do this exercise once instructed by your health care provider. Sit with your left / right forearm supported on a table or other surface and your palm turned down toward the floor. Let your left / right wrist extend over the edge of the surface. Hold a 5 lbs weight or a piece of rubber exercise band or tubing. If you are using a rubber exercise band or tubing, hold the band or tubing in place with your other hand to provide resistance. Slowly bend your wrist so your hand moves up toward the ceiling (extension). Move only your wrist, keeping your forearm and elbow still. Hold this position for 10 seconds. Slowly return to the starting position. Repeat 10 times. Complete this exercise 1-2 times a day. Forearm rotation, supination To do this exercise, you will need a lightweight hammer or rubber mallet. Sit with your left / right forearm supported on a table or other surface. Bend your elbow to a 90-degree angle (right angle). Position your forearm so that your palm is facing down toward the floor, with your hand resting over the edge of the table. Hold a hammer in your left / right hand. To make this exercise easier, hold the hammer near the head of the hammer. To make this exercise harder, hold the hammer near the end of the handle. Without moving your wrist or elbow, slowly rotate your forearm so your palm faces up toward the ceiling  (supination). Hold this position for 10 seconds. Slowly return to the starting position. Repeat 10 times. Complete this exercise 1-2 times a day. Shoulder blade squeeze Sit in a stable chair or stand with good posture. If you are sitting down, do not let your back touch the back of the chair. Your arms should be at your sides with your elbows bent to a 90-degree angle (right angle). Position your forearms so that your thumbs are facing the ceiling (neutral position). Without lifting your shoulders up, squeeze your shoulder blades tightly together. Hold this position for 10 seconds. Slowly release and return to the starting position. Repeat 10 times. Complete this exercise 1-2 times a day.  This information is not intended to replace advice given to you by your health care provider. Make sure you discuss any questions you have with your healthcare provider. Document Revised: 09/17/2019 Document Reviewed: 09/17/2019 Elsevier Patient Education  2022 [employer]  Inc.

## 2023-02-21 NOTE — Progress Notes (Signed)
Orthopaedic Clinic Return  Assessment: Travis Delgado is a 52 y.o. male with the following: Right elbow lateral epicondylitis  Plan: Mr. Somogyi has pain in the right lateral elbow.  Presentation is consistent with tennis elbow.  This was discussed with the patient.  He states that ibuprofen has been helping his pain.  I encouraged him to take this medication every 8 hours for the next week.  After that, he can return to taking that medicine as needed.  He can also consider using ice or topical treatments.  I recommended Voltaren gel.  I have also provided him with home exercises.  If he continues to have issues, I would recommend proceeding with an injection.  Follow-up: Return if symptoms worsen or fail to improve.   Subjective:  Chief Complaint  Patient presents with   Elbow Pain    R elbow pain down to the hand for 3 wks.     History of Present Illness: Travis Delgado is a 52 y.o. male who returns to clinic for evaluation of right elbow pain.  Said pain in the lateral aspect of the right elbow for the past 3 weeks.  No specific injury.  Pain radiates from the elbow towards his hand.  He has difficulty gripping objects due to the pain.  No numbness or tingling.  He does have a history of injury to the long and ring fingers, and already has decreased sensation in this area, but this is not changed since the onset of his pain.  He has been taking some ibuprofen, which improves his symptoms.  He is only been taking the ibuprofen as needed.  It is written for every 8 hours per Dr. Allena Katz.    Review of Systems: No fevers or chills No numbness or tingling No chest pain No shortness of breath No bowel or bladder dysfunction No GI distress No headaches   Objective: BP (!) 128/92   Pulse 79   Ht 5\' 8"  (1.727 m)   Wt 223 lb (101.2 kg)   BMI 33.91 kg/m   Physical Exam:  Evaluation of right elbow demonstrates no deformity.  No swelling.  No bruising.  He has tenderness  palpation over the lateral epicondyle.  He has pain with resisted long finger extension.  He has pain with resisted wrist extension.  Fingers are warm and well-perfused.  Decree sensation to the long and ring finger, which is baseline.  IMAGING: I personally ordered and reviewed the following images:  No new imaging obtained today.   Oliver Barre, MD 02/21/2023 11:25 AM

## 2023-03-13 ENCOUNTER — Ambulatory Visit: Payer: 59 | Admitting: Nurse Practitioner

## 2023-03-13 DIAGNOSIS — E782 Mixed hyperlipidemia: Secondary | ICD-10-CM

## 2023-03-13 DIAGNOSIS — E1165 Type 2 diabetes mellitus with hyperglycemia: Secondary | ICD-10-CM

## 2023-03-13 DIAGNOSIS — Z7985 Long-term (current) use of injectable non-insulin antidiabetic drugs: Secondary | ICD-10-CM

## 2023-03-13 DIAGNOSIS — I10 Essential (primary) hypertension: Secondary | ICD-10-CM

## 2023-03-13 DIAGNOSIS — Z794 Long term (current) use of insulin: Secondary | ICD-10-CM

## 2023-03-15 ENCOUNTER — Other Ambulatory Visit: Payer: Self-pay | Admitting: Nurse Practitioner

## 2023-03-21 ENCOUNTER — Ambulatory Visit (INDEPENDENT_AMBULATORY_CARE_PROVIDER_SITE_OTHER): Payer: 59 | Admitting: Orthopedic Surgery

## 2023-03-21 ENCOUNTER — Encounter: Payer: Self-pay | Admitting: Orthopedic Surgery

## 2023-03-21 VITALS — BP 129/95 | HR 86 | Ht 68.0 in | Wt 220.0 lb

## 2023-03-21 DIAGNOSIS — M7711 Lateral epicondylitis, right elbow: Secondary | ICD-10-CM | POA: Diagnosis not present

## 2023-03-21 NOTE — Patient Instructions (Addendum)
Instructions Following Joint Injections  In clinic today, you received an injection in one of your joints (sometimes more than one).  Occasionally, you can have some pain at the injection site, this is normal.  You can place ice at the injection site, or take over-the-counter medications such as Tylenol (acetaminophen) or Advil (ibuprofen).  Please follow all directions listed on the bottle.  If your joint (knee or shoulder) becomes swollen, red or very painful, please contact the clinic for additional assistance.   Two medications were injected, including lidocaine and a steroid (often referred to as cortisone).  Lidocaine is effective almost immediately but wears off quickly.  However, the steroid can take a few days to improve your symptoms.  In some cases, it can make your pain worse for a couple of days.  Do not be concerned if this happens as it is common.  You can apply ice or take some over-the-counter medications as needed.   Injections in the same joint cannot be repeated for 3 months.  This helps to limit the risk of an infection in the joint.  If you were to develop an infection in your joint, the best treatment option would be surgery.     [facility] El[facility] Do exercises exactly as told by your health care provider and adjust them as directed. It is normal to feel mild stretching, pulling, tightness, or discomfort as you do these exercises. Stop right away if you feel sudden pain or your pain gets worse.   Stretching and range-of-motion exercises These exercises warm up your muscles and joints and improve the movement andflexibility of your elbow. Wrist flexion, assisted  Straighten your left / right elbow in front of you with your palm facing down toward the floor. If told by your health care provider, bend your left / right elbow to a 90-degree angle (right angle) at your side instead of holding it straight. With your other hand, gently push over the back of your left / right  hand so your fingers point toward the floor (flexion). Stop when you feel a gentle stretch on the back of your forearm. Hold this position for 10 seconds. Repeat 10 times. Complete this exercise 1-2 times a day. Wrist extension, assisted  Straighten your left / right elbow in front of you with your palm facing up toward the ceiling. If told by your health care provider, bend your left / right elbow to a 90-degree angle (right angle) at your side instead of holding it straight. With your other hand, gently pull your left / right hand and fingers toward the floor (extension). Stop when you feel a gentle stretch on the palm side of your forearm. Hold this position for 10 seconds. Repeat 10 times. Complete this exercise 1-2 times a day. Assisted forearm rotation, supination Sit or stand with your elbows at your side. Bend your left / right elbow to a 90-degree angle (right angle). Using your uninjured hand, turn your left / right palm up toward the ceiling (supination) until you feel a gentle stretch along the inside of your forearm. Hold this position for 10 seconds. Repeat 10 times. Complete this exercise 1-2 times a day. Assisted forearm rotation, pronation Sit or stand with your elbows at your side. Bend your left / right elbow to a 90-degree angle (right angle). Using your uninjured hand, turn your left / right palm down toward the floor (pronation) until you feel a gentle stretch along the outside of your forearm. Hold this position  for 10 seconds. Repeat 10 times. Complete this exercise 1-2 times a day. Strengthening exercises These exercises build strength and endurance in your forearm and elbow. Endurance is the ability to use your muscles for a long time, even after theyget tired. Radial deviation  Stand with a 5 lbs weight or a hammer in your left / right hand. Or, sit while holding a rubber exercise band or tubing, with your left / right forearm supported on a table or  countertop. Position your forearm so that the thumb is facing the ceiling, as if you are going to clap your hands. This is the neutral position. Raise your hand upward in front of you so your thumb moves toward the ceiling (radial deviation), or pull up on the rubber tubing. Keep your forearm and elbow still while you move your wrist only. Hold this position for 10 seconds. Slowly return to the starting position. Repeat 10 times. Complete this exercise 1-2 times a day. Wrist extension, eccentric Sit with your left / right forearm palm-down and supported on a table or other surface. Let your left / right wrist extend over the edge of the surface. Hold a 5 lbs (can of soup) weight or a piece of exercise band or tubing in your left / right hand. If using a rubber exercise band or tubing, hold the other end of the tubing with your other hand. Use your uninjured hand to move your left / right hand up toward the ceiling. Take your uninjured hand away and slowly return to the starting position using only your left / right hand. Lowering your arm under tension is called eccentric extension. Repeat 10 times. Complete this exercise 1-2 times a day. Wrist extension  Do not do this exercise if it causes pain at the outside of your elbow. Only do this exercise once instructed by your health care provider. Sit with your left / right forearm supported on a table or other surface and your palm turned down toward the floor. Let your left / right wrist extend over the edge of the surface. Hold a 5 lbs weight or a piece of rubber exercise band or tubing. If you are using a rubber exercise band or tubing, hold the band or tubing in place with your other hand to provide resistance. Slowly bend your wrist so your hand moves up toward the ceiling (extension). Move only your wrist, keeping your forearm and elbow still. Hold this position for 10 seconds. Slowly return to the starting position. Repeat 10 times. Complete  this exercise 1-2 times a day. Forearm rotation, supination To do this exercise, you will need a lightweight hammer or rubber mallet. Sit with your left / right forearm supported on a table or other surface. Bend your elbow to a 90-degree angle (right angle). Position your forearm so that your palm is facing down toward the floor, with your hand resting over the edge of the table. Hold a hammer in your left / right hand. To make this exercise easier, hold the hammer near the head of the hammer. To make this exercise harder, hold the hammer near the end of the handle. Without moving your wrist or elbow, slowly rotate your forearm so your palm faces up toward the ceiling (supination). Hold this position for 10 seconds. Slowly return to the starting position. Repeat 10 times. Complete this exercise 1-2 times a day. Shoulder blade squeeze Sit in a stable chair or stand with good posture. If you are sitting down, do not  let your back touch the back of the chair. Your arms should be at your sides with your elbows bent to a 90-degree angle (right angle). Position your forearms so that your thumbs are facing the ceiling (neutral position). Without lifting your shoulders up, squeeze your shoulder blades tightly together. Hold this position for 10 seconds. Slowly release and return to the starting position. Repeat 10 times. Complete this exercise 1-2 times a day. This information is not intended to replace advice given to you by your health care provider. Make sure you discuss any questions you have with your healthcare provider. Document Revised: 09/17/2019 Document Reviewed: 09/17/2019 Elsevier Patient Education  2022 [employer].

## 2023-03-21 NOTE — Progress Notes (Unsigned)
Orthopaedic Clinic Return  Assessment: Travis Delgado is a 52 y.o. male with the following: Right elbow lateral epicondylitis  Plan: Mr. Schloss has continued pain in the right lateral elbow.  Pain is worsened since I saw him last.  Presentation is consistent with lateral epicondylitis.  I offered him an injection, and he elected to proceed.  This was completed in clinic today without issues.  He will follow-up as needed.  Procedure note injection - Right elbow, lateral epicondyle  Verbal consent was obtained to inject the Right elbow, lateral epicondyle Timeout was completed to confirm the site of injection.  The skin was prepped with alcohol and ethyl chloride was sprayed at the injection site.  A 21-gauge needle was used to inject 40 mg of Depo-Medrol and 1% lidocaine (1 cc) into the Right lateral epicondyle using a direct lateral approach.  There were no complications. Patient tolerated the procedure well. A sterile bandage was applied    Follow-up: Return if symptoms worsen or fail to improve.   Subjective:  Chief Complaint  Patient presents with   Elbow Pain    Right elbow pain     History of Present Illness: [name] is a 52 y.o. male who returns to clinic for evaluation of right elbow pain.  Said pain in the lateral aspect of the right elbow for the past 3 weeks.  No specific injury.  Pain radiates from the elbow towards his hand.  He has difficulty gripping objects due to the pain.  No numbness or tingling.  He does have a history of injury to the long and ring fingers, and already has decreased sensation in this area, but this is not changed since the onset of his pain.  He has been taking some ibuprofen, which improves his symptoms.  He is only been taking the ibuprofen as needed.  It is written for every 8 hours per Dr. Allena Katz.    Review of Systems: No fevers or chills No numbness or tingling No chest pain No shortness of breath No bowel or bladder  dysfunction No GI distress No headaches   Objective: BP (!) 129/95   Pulse 86   Ht 5\' 8"  (1.727 m)   Wt 220 lb (99.8 kg)   BMI 33.45 kg/m   Physical Exam:  Evaluation of right elbow demonstrates no deformity.  No swelling.  No bruising.  He has tenderness palpation over the lateral epicondyle.  He has pain with resisted long finger extension.  He has pain with resisted wrist extension.  Fingers are warm and well-perfused.  Decree sensation to the long and ring finger, which is baseline.  IMAGING: I personally ordered and reviewed the following images:  No new imaging obtained today.   Oliver Barre, MD 03/21/2023 1:47 PM

## 2023-03-27 ENCOUNTER — Ambulatory Visit: Payer: 59 | Admitting: Orthopedic Surgery

## 2023-03-28 ENCOUNTER — Encounter: Payer: Self-pay | Admitting: Pharmacist

## 2023-04-03 ENCOUNTER — Ambulatory Visit: Payer: 59 | Admitting: Internal Medicine

## 2023-04-04 ENCOUNTER — Other Ambulatory Visit: Payer: Self-pay

## 2023-04-04 ENCOUNTER — Emergency Department (HOSPITAL_COMMUNITY)
Admission: EM | Admit: 2023-04-04 | Discharge: 2023-04-04 | Disposition: A | Discharge status: Home / Self Care | Payer: 59 | Attending: Emergency Medicine | Admitting: Emergency Medicine

## 2023-04-04 ENCOUNTER — Encounter (HOSPITAL_COMMUNITY): Payer: Self-pay | Admitting: Emergency Medicine

## 2023-04-04 ENCOUNTER — Emergency Department (HOSPITAL_COMMUNITY): Payer: 59

## 2023-04-04 DIAGNOSIS — S92414A Nondisplaced fracture of proximal phalanx of right great toe, initial encounter for closed fracture: Secondary | ICD-10-CM | POA: Diagnosis not present

## 2023-04-04 DIAGNOSIS — S99921A Unspecified injury of right foot, initial encounter: Secondary | ICD-10-CM | POA: Diagnosis present

## 2023-04-04 DIAGNOSIS — E119 Type 2 diabetes mellitus without complications: Secondary | ICD-10-CM | POA: Insufficient documentation

## 2023-04-04 DIAGNOSIS — W010XXA Fall on same level from slipping, tripping and stumbling without subsequent striking against object, initial encounter: Secondary | ICD-10-CM | POA: Diagnosis not present

## 2023-04-04 DIAGNOSIS — Z794 Long term (current) use of insulin: Secondary | ICD-10-CM | POA: Diagnosis not present

## 2023-04-04 MED ORDER — HYDROCODONE-ACETAMINOPHEN 5-325 MG PO TABS: 1.0000 | ORAL_TABLET | Freq: Four times a day (QID) | ORAL | 0 refills | Status: DC | PRN

## 2023-04-04 MED ORDER — IBUPROFEN 400 MG PO TABS
600.0000 mg | ORAL_TABLET | Freq: Once | ORAL | Status: AC
  Administered 2023-04-04: 600 mg via ORAL
  Filled 2023-04-04: qty 2

## 2023-04-04 MED ORDER — HYDROCODONE-ACETAMINOPHEN 5-325 MG PO TABS
1.0000 | ORAL_TABLET | Freq: Once | ORAL | Status: AC
  Administered 2023-04-04: 1 via ORAL
  Filled 2023-04-04: qty 1

## 2023-04-04 MED ORDER — NAPROXEN 500 MG PO TABS: 500.0000 mg | ORAL_TABLET | Freq: Two times a day (BID) | ORAL | 0 refills | Status: DC

## 2023-04-04 NOTE — ED Triage Notes (Signed)
Pt slipped on wet step and landed on right big toe. Pt complains of pain in right great toe and 2nd toe. Denies any other injury or pain

## 2023-04-04 NOTE — ED Provider Notes (Signed)
[place] EMERGENCY DEPARTMENT AT [facility] Provider Note   CSN: [id number] Arrival date & time: 04/04/23  1645     History  Chief Complaint  Patient presents with   Toe Injury    Travis Delgado is a 52 y.o. male.  PMH of diabetes.  Presents to ER with pain to the right great toe.  States he slipped with his left foot today and subsequently hit his right foot on the ground ending all of his toes.  He has been having pain and swelling since that time.  Pain is worse in the great toe but mild pain in all of his toes.  No numbness or tingling.  Denies head injury other complaints.  HPI     Home Medications Prior to Admission medications   Medication Sig Start Date End Date Taking? Authorizing Provider  acetaminophen (TYLENOL) 325 MG tablet Take 2 tablets (650 mg total) by mouth every 6 (six) hours as needed. 02/13/22   Sloan Leiter, DO  Alirocumab (PRALUENT) 150 MG/ML SOAJ Inject 1 mL (150 mg total) into the skin every 14 (fourteen) days. 02/14/23   Anabel Halon, MD  atorvastatin (LIPITOR) 40 MG tablet Take 1 tablet (40 mg total) by mouth daily. 11/09/22   Dani Gobble, NP  Continuous Glucose Sensor (FREESTYLE LIBRE 3 SENSOR) MISC Place 1 sensor on the skin every 14 days. Use to check glucose continuously 11/09/22   Dani Gobble, NP  ibuprofen (ADVIL) 600 MG tablet Take 1 tablet (600 mg total) by mouth every 8 (eight) hours as needed. 02/14/23   Anabel Halon, MD  Insulin Glargine [facility]) 100 UNIT/ML Inject 30 Units into the skin at bedtime. 11/09/22   Dani Gobble, NP  INSULIN SYRINGE .5CC/29G 29G X 1/2" 0.5 ML MISC Use to inject insulin once daily 04/12/22   Dani Gobble, NP  Multiple Vitamins-Minerals (MEGA MULTIVITAMIN FOR MEN PO) Take 1 tablet by mouth daily.    [provider]  oxyCODONE (ROXICODONE) 5 MG immediate release tablet Take 1 tablet (5 mg total) by mouth every 4 (four) hours as needed for severe pain. Patient not  taking: Reported on 03/21/2023 02/13/22   Tanda Rockers A, DO  rizatriptan (MAXALT-MLT) 10 MG disintegrating tablet DISSOLVE 1 TABLET ON THE TONGUE EVERY DAY AS NEEDED FOR MIGRAINE. MAY REPEAT IN 2 HOURS AS NEEDED 04/12/22   Anabel Halon, MD  sertraline (ZOLOFT) 25 MG tablet Take 1 tablet (25 mg total) by mouth daily. 02/14/23   Anabel Halon, MD  tirzepatide [facility]) 5 MG/0.5ML Pen INJECT 5 MG SUBCUTANEOUSLY WEEKLY 03/15/23   Dani Gobble, NP      Allergies    Glipizide and Metformin    Review of Systems   Review of Systems  Physical Exam Updated Vital Signs BP (!) 115/90 (BP Location: Right Arm)   Pulse 87   Temp 98.2 F (36.8 C) (Oral)   Resp 14   Ht 5\' 8"  (1.727 m)   Wt 99 kg   SpO2 94%   BMI 33.19 kg/m  Physical Exam Vitals and nursing note reviewed.  Constitutional:      General: He is not in acute distress.    Appearance: He is well-developed.  HENT:     Head: Normocephalic and atraumatic.     Mouth/Throat:     Mouth: Mucous membranes are moist.  Eyes:     Conjunctiva/sclera: Conjunctivae normal.  Cardiovascular:     Rate and Rhythm:  Normal rate and regular rhythm.     Heart sounds: No murmur heard. Pulmonary:     Effort: Pulmonary effort is normal. No respiratory distress.     Breath sounds: Normal breath sounds.  Abdominal:     Palpations: Abdomen is soft.     Tenderness: There is no abdominal tenderness.  Musculoskeletal:        General: Swelling present.     Cervical back: Neck supple.     Comments: Swelling and tenderness to right great toe, specifically the proximal phalanx.  No crepitus.  Capillary refill is brisk.  Mild tenderness to all of the right foot without swelling or deformity.  Skin:    General: Skin is warm and dry.     Capillary Refill: Capillary refill takes less than 2 seconds.  Neurological:     General: No focal deficit present.     Mental Status: He is alert and oriented to person, place, and time.  Psychiatric:        Mood and  Affect: Mood normal.     ED Results / Procedures / Treatments   Labs (all labs ordered are listed, but only abnormal results are displayed) Labs Reviewed - No data to display  EKG None  Radiology DG Foot 2 Views Right  Result Date: 04/04/2023 CLINICAL DATA:  Fall with toe pain EXAM: RIGHT FOOT - 2 VIEW COMPARISON:  02/16/2009 FINDINGS: Acute nondisplaced intra-articular fracture base of first proximal phalanx. No malalignment. Mild degenerative change at the first MTP joint IMPRESSION: Acute nondisplaced intra-articular fracture base of first proximal phalanx. Electronically Signed   By: Jasmine Pang M.D.   On: 04/04/2023 19:17    Procedures Procedures    Medications Ordered in ED Medications - No data to display  ED Course/ Medical Decision Making/ A&P                                 Medical Decision Making This patient presents to the ED for concern of the right great toe, this involves an extensive number of treatment options, and is a complaint that carries with it a high risk of complications and morbidity.  The differential diagnosis includes, dislocation, sprain, contusion, other   Co morbidities that complicate the patient evaluation :   Diabetes   Additional history obtained:  Additional history obtained from EMR External records from outside source obtained and reviewed including notes     Imaging Studies ordered:  I ordered imaging studies including right foot x-ray which shows nondisplaced fracture that is intra-articular of the base of the proximal phalanx of right great toe I independently visualized and interpreted imaging within scope of identifying emergent findings  I agree with the radiologist interpretation    Problem List / ED Course / Critical interventions / Medication management  Right great toe proximal phalanx fracture that is intra-articular and nondisplaced of the great toe.  Foot is neurovascularly intact.  Given that this is  intra-articular will put patient in posterior splint, he is advised that he needs to be nonweightbearing and follow-up with orthopedics. I ordered medication including Norco and ibuprofen for pain.  Patient unfortunately left before I was able to evaluate his splint Reevaluation of the patient after these medicines showed that the patient improved I have reviewed the patients home medicines and have made adjustments as needed      Amount and/or Complexity of Data Reviewed Radiology: ordered.  Risk Prescription drug  management.           Final Clinical Impression(s) / ED Diagnoses Final diagnoses:  None    Rx / DC Orders ED Discharge Orders     None         Travis Delgado 04/04/23 2143    Cathren Laine, MD 04/05/23 [phone]

## 2023-04-04 NOTE — Discharge Instructions (Addendum)
It was a pleasure to take care of you today.  You are seen for a toe injury.  X-rays show a fracture.  Since the fracture extends to the joint we need to put you in a splint.  Do not put any weight on this foot and follow-up closely with orthopedics.

## 2023-04-05 MED FILL — Hydrocodone-Acetaminophen Tab 5-325 MG: ORAL | Qty: 6 | Status: AC

## 2023-04-06 ENCOUNTER — Encounter: Payer: Self-pay | Admitting: Orthopedic Surgery

## 2023-04-06 ENCOUNTER — Ambulatory Visit: Payer: 59 | Admitting: Orthopedic Surgery

## 2023-04-06 DIAGNOSIS — S92414A Nondisplaced fracture of proximal phalanx of right great toe, initial encounter for closed fracture: Secondary | ICD-10-CM | POA: Diagnosis not present

## 2023-04-06 DIAGNOSIS — S92424A Nondisplaced fracture of distal phalanx of right great toe, initial encounter for closed fracture: Secondary | ICD-10-CM

## 2023-04-06 MED ORDER — HYDROCODONE-ACETAMINOPHEN 5-325 MG PO TABS: 1.0000 | ORAL_TABLET | Freq: Four times a day (QID) | ORAL | 0 refills | Status: DC | PRN

## 2023-04-06 NOTE — Progress Notes (Signed)
Orthopaedic Clinic Return  Assessment: Travis Delgado is a 52 y.o. male with the following: Right great toe, distal phalanx fracture  Plan: Travis Delgado has a fracture at the base of the distal phalanx of the right great toe.  Minimally displaced.  Swelling and bruising is appropriate.  He was in a splint today, will be transition him to a walking boot.  He can bear weight through the heel.  Nonweightbearing through the forefoot.  Anticipated course was discussed with the patient in clinic today.  Follow-up in 3 weeks.  Follow-up: Return in about 3 weeks (around 04/27/2023).   Subjective:  Chief Complaint  Patient presents with   Fracture    R big toe DOI 04/04/23    History of Present Illness: Travis Delgado is a 52 y.o. male who returns to clinic for evaluation of right foot pain.  He stepped and twisted his toe a few days ago.  He states he was walking in sandals, when he slipped.  He had immediate pain, but did wait to go to the emergency department.  Radiographs the emergency department demonstrates a fracture of the great toe.  He has been in a splint.  He has been taking hydrocodone.  He is using crutches to assist with ambulation.  Review of Systems: No fevers or chills No numbness or tingling No chest pain No shortness of breath No bowel or bladder dysfunction No GI distress No headaches   Objective: There were no vitals taken for this visit.  Physical Exam:  Alert and oriented.  No acute distress.  Ambulating with crutches  After removal of the splint, there is no skin breakdown.  He has diffuse bruising to the great toe.  Tenderness to palpation.  He does have some swelling to the dorsum of the foot.  Sensation intact throughout the right foot.  IMAGING: I personally ordered and reviewed the following images:  X-rays of the great toe from the ED were evaluated.  There is a minimally displaced fracture of the distal phalanx of the right great toe, with  extension to the articular surface.  Minimal displacement.  Evidence of a prior injury on the plantar surface of the proximal phalanx  Oliver Barre, MD 04/06/2023 11:39 AM

## 2023-04-09 ENCOUNTER — Telehealth: Payer: Self-pay | Admitting: Orthopedic Surgery

## 2023-04-09 NOTE — Telephone Encounter (Signed)
Patient lvm on 04/06/23 at 12:15pm, stating that his medication was sent to [facility] on [address] and it needs to go to [facility] in [place].  I lvm this morning stating that [facility] has not been able to get the Hydrocodone, to check and see if they have it and if so I will ask that it be sent there instead.  Asked the pt to call us back if he needs anything else.

## 2023-04-11 ENCOUNTER — Ambulatory Visit (INDEPENDENT_AMBULATORY_CARE_PROVIDER_SITE_OTHER): Payer: 59 | Admitting: Nurse Practitioner

## 2023-04-11 ENCOUNTER — Encounter: Payer: Self-pay | Admitting: Nurse Practitioner

## 2023-04-11 VITALS — BP 134/75 | HR 92 | Ht 68.0 in | Wt 220.0 lb

## 2023-04-11 DIAGNOSIS — E782 Mixed hyperlipidemia: Secondary | ICD-10-CM

## 2023-04-11 DIAGNOSIS — E1165 Type 2 diabetes mellitus with hyperglycemia: Secondary | ICD-10-CM

## 2023-04-11 DIAGNOSIS — Z794 Long term (current) use of insulin: Secondary | ICD-10-CM

## 2023-04-11 DIAGNOSIS — Z7985 Long-term (current) use of injectable non-insulin antidiabetic drugs: Secondary | ICD-10-CM | POA: Diagnosis not present

## 2023-04-11 LAB — POCT GLYCOSYLATED HEMOGLOBIN (HGB A1C): Hemoglobin A1C: 8.2 % — AB (ref 4.0–5.6)

## 2023-04-11 LAB — POCT UA - MICROALBUMIN: Albumin/Creatinine Ratio, Urine, POC: <30

## 2023-04-11 MED ORDER — TIRZEPATIDE 7.5 MG/0.5ML ~~LOC~~ SOAJ: 7.5000 mg | SUBCUTANEOUS | 1 refills | Status: DC
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
## 2023-04-13 ENCOUNTER — Other Ambulatory Visit: Payer: Self-pay | Admitting: Internal Medicine

## 2023-04-13 DIAGNOSIS — F331 Major depressive disorder, recurrent, moderate: Secondary | ICD-10-CM

## 2023-04-19 ENCOUNTER — Encounter: Payer: Self-pay | Admitting: Internal Medicine

## 2023-04-19 ENCOUNTER — Ambulatory Visit: Payer: 59 | Admitting: Internal Medicine

## 2023-04-19 VITALS — BP 136/88 | HR 68 | Ht 68.0 in | Wt 224.0 lb

## 2023-04-19 DIAGNOSIS — E1165 Type 2 diabetes mellitus with hyperglycemia: Secondary | ICD-10-CM | POA: Diagnosis not present

## 2023-04-19 DIAGNOSIS — F331 Major depressive disorder, recurrent, moderate: Secondary | ICD-10-CM

## 2023-04-19 DIAGNOSIS — Z23 Encounter for immunization: Secondary | ICD-10-CM | POA: Diagnosis not present

## 2023-04-19 DIAGNOSIS — S92424D Nondisplaced fracture of distal phalanx of right great toe, subsequent encounter for fracture with routine healing: Secondary | ICD-10-CM

## 2023-04-19 DIAGNOSIS — K219 Gastro-esophageal reflux disease without esophagitis: Secondary | ICD-10-CM | POA: Insufficient documentation

## 2023-04-19 DIAGNOSIS — Z794 Long term (current) use of insulin: Secondary | ICD-10-CM

## 2023-04-19 DIAGNOSIS — E782 Mixed hyperlipidemia: Secondary | ICD-10-CM

## 2023-04-19 DIAGNOSIS — M7711 Lateral epicondylitis, right elbow: Secondary | ICD-10-CM

## 2023-04-19 DIAGNOSIS — I10 Essential (primary) hypertension: Secondary | ICD-10-CM | POA: Diagnosis not present

## 2023-04-19 DIAGNOSIS — S92424A Nondisplaced fracture of distal phalanx of right great toe, initial encounter for closed fracture: Secondary | ICD-10-CM | POA: Insufficient documentation

## 2023-04-19 MED ORDER — OMEPRAZOLE 20 MG PO CPDR
20.0000 mg | DELAYED_RELEASE_CAPSULE | Freq: Every day | ORAL | 3 refills | Status: DC
Start: 2023-04-19 — End: 2024-03-28

## 2023-04-19 MED ORDER — SERTRALINE HCL 50 MG PO TABS
50.0000 mg | ORAL_TABLET | Freq: Every day | ORAL | 3 refills | Status: DC
Start: 2023-04-19 — End: 2023-07-31

## 2023-04-19 MED ORDER — PRALUENT 150 MG/ML ~~LOC~~ SOAJ
150.0000 mg | SUBCUTANEOUS | 2 refills | Status: DC
Start: 2023-04-19 — End: 2023-07-31

## 2023-04-19 NOTE — Assessment & Plan Note (Signed)
Lipid profile reviewed - LDL 188 On statin, but does not take it due to leg pain Has tried Crestor, Lipitor and pravastatin - had statin induced myopathy Has constant leg pain/myalgias with them Started Praluent

## 2023-04-19 NOTE — Assessment & Plan Note (Signed)
BP Readings from Last 1 Encounters:  04/19/23 136/88   Well-controlled now with diet modification Advised DASH diet and moderate exercise/walking, at least 150 mins/week

## 2023-04-19 NOTE — Assessment & Plan Note (Signed)
Pain and swelling of right elbow likely due to lateral epicondylitis Ibuprofen as needed for pain Advised to apply ice Has been evaluated by Orthopedic surgery -had temporary relief with steroid injection, pain recurred now, currently followed by orthopedic surgeon for fracture of right great toe
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
## 2023-04-19 NOTE — Assessment & Plan Note (Addendum)
Flowsheet Row Office Visit from 04/19/2023 in [facility]  PHQ-9 Total Score 12      Uncontrolled with Zoloft 25 mg once daily Recent loss of his niece also contributing to worsening of depression Difficulty concentration is likely due to MDD Increased dose of Zoloft to 50 mg QD [facility] therapy referral, but he prefers to wait for now

## 2023-04-19 NOTE — Patient Instructions (Signed)
Please start taking Zoloft 50 mg once daily instead of 25 mg.  Please start taking Omeprazole once daily for 1 week and then as needed for indigestion/stomach pain.  Please continue to take other medications as prescribed.  Please continue to follow low carb diet and perform moderate exercise/walking at least 150 mins/week.

## 2023-04-19 NOTE — Assessment & Plan Note (Signed)
Due to a slip injury Followed by orthopedic surgery-has walking boot in place

## 2023-04-19 NOTE — Assessment & Plan Note (Addendum)
His epigastric discomfort is likely due to GERD Started omeprazole 20 mg once daily Avoid hot and spicy food Needs to follow small, frequent meals to avoid GI side effects from [facility]

## 2023-04-19 NOTE — Assessment & Plan Note (Addendum)
Lab Results  Component Value Date   HGBA1C 8.2 (A) 04/11/2023   Uncontrolled, but improved from 10.1 On Mounjaro and Lantus 20 U at bedtime now Followed by Endocrinology Advised to follow diabetic diet Had to stop statin due to severe myalgia with at least 2 different statins- started Praluent, but was not covered - will try to submit new Rx and prior authorization Diabetic eye exam: Advised to follow up with Ophthalmology for diabetic eye exam

## 2023-04-27 ENCOUNTER — Encounter: Payer: Self-pay | Admitting: Orthopedic Surgery

## 2023-04-27 ENCOUNTER — Ambulatory Visit (INDEPENDENT_AMBULATORY_CARE_PROVIDER_SITE_OTHER): Payer: 59 | Admitting: Orthopedic Surgery

## 2023-04-27 ENCOUNTER — Other Ambulatory Visit (INDEPENDENT_AMBULATORY_CARE_PROVIDER_SITE_OTHER): Payer: 59

## 2023-04-27 DIAGNOSIS — S92424A Nondisplaced fracture of distal phalanx of right great toe, initial encounter for closed fracture: Secondary | ICD-10-CM | POA: Diagnosis not present

## 2023-04-27 DIAGNOSIS — S92414D Nondisplaced fracture of proximal phalanx of right great toe, subsequent encounter for fracture with routine healing: Secondary | ICD-10-CM

## 2023-04-27 NOTE — Progress Notes (Signed)
Orthopaedic Clinic Return  Assessment: Travis Delgado is a 52 y.o. male with the following: Right great toe, distal phalanx fracture  Plan: Travis Delgado has a fracture at the base of the distal phalanx of the right great toe.  He is improving.  Repeat radiographs today remained stable.  I have recommended he continue to use the boot when he is ambulating for the next 1-2 weeks.  After that, he can slowly transition out of the boot.  Okay to bear weight through the heel, with or without the boot at this time.  I would like to check on him in approximately 3 weeks.   Follow-up: Return in about 3 weeks (around 05/18/2023).   Subjective:  Chief Complaint  Patient presents with   Toe Injury    R Great toe/ it still hurts, was dreaming the other night and hit the wall. Stopped the medicine for pain.    History of Present Illness: Travis Delgado is a 52 y.o. male who returns to clinic for evaluation of right foot pain.  He injured his right great toe, approximate 3 weeks ago.  He has been in a boot.  The pain is getting better.  He does take some steps at home without the boot on.  No recent injuries.  He does note ongoing pain with motion of the right great toe.    Review of Systems: No fevers or chills No numbness or tingling No chest pain No shortness of breath No bowel or bladder dysfunction No GI distress No headaches   Objective: There were no vitals taken for this visit.  Physical Exam:  Alert and oriented.  No acute distress.  Ambulating in a boot.  Right foot without deformity.  Mild swelling to the great toe.  He has tenderness at the first MTP, as well as the IP joint of the great toe.  He has pain with flexion and extension of the IP joint.  Toes warm well-perfused.  Mild tenderness to palpation at the IP joint.   IMAGING: I personally ordered and reviewed the following images:  X-rays the great toe were obtained in clinic today.  These are compared to  prior x-rays.  Intra-articular fracture of the proximal phalanx, remains in stable position.  No interval displacement.  No obvious callus relation.  No additional injuries noted.  No dislocation.  No bony lesions.  Impression: Stable right great toe, proximal phalanx fracture  Oliver Barre, MD 04/27/2023 10:24 AM

## 2023-04-27 NOTE — Patient Instructions (Signed)
Continue to use the boot for an additional 1-2 weeks.  After that, he can transition out of the boot.  Continue with weightbearing through the heel as tolerated.  Follow-up in 3 weeks

## 2023-05-16 ENCOUNTER — Other Ambulatory Visit (INDEPENDENT_AMBULATORY_CARE_PROVIDER_SITE_OTHER): Payer: Self-pay

## 2023-05-16 ENCOUNTER — Encounter: Payer: Self-pay | Admitting: Orthopedic Surgery

## 2023-05-16 ENCOUNTER — Ambulatory Visit (INDEPENDENT_AMBULATORY_CARE_PROVIDER_SITE_OTHER): Payer: 59 | Admitting: Orthopedic Surgery

## 2023-05-16 DIAGNOSIS — M7711 Lateral epicondylitis, right elbow: Secondary | ICD-10-CM | POA: Diagnosis not present

## 2023-05-16 DIAGNOSIS — S92414D Nondisplaced fracture of proximal phalanx of right great toe, subsequent encounter for fracture with routine healing: Secondary | ICD-10-CM

## 2023-05-16 MED ORDER — PREDNISONE 10 MG (21) PO TBPK: ORAL_TABLET | ORAL | 0 refills | Status: DC

## 2023-05-16 NOTE — Progress Notes (Signed)
Orthopaedic Clinic Return  Assessment: ODEL SCHMID is a 52 y.o. male with the following: Right great toe, distal phalanx fracture Right lateral epicondylitis  Plan: Mr. Hellums is getting better.  He continues to have some pain in the right great toe.  Radiographs remain stable.  Okay for him to start bearing weight, without restrictions.  Anticipate that this may continue to linger, but low concern for any worsening of the current injury.  He states that his right elbow has flared up again, especially this morning after he hit his elbow on a door frame.  I offered him a prednisone Dosepak to try and get this flare to calm down.  If he continues to have issues, we can discuss another injection.   Follow-up: Return if symptoms worsen or fail to improve.   Subjective:  Chief Complaint  Patient presents with   Fracture    R big toe DOI 04/04/23    History of Present Illness: LYALL FACIANE is a 52 y.o. male who returns to clinic for evaluation of right foot pain.  He injured his right great toe, approximately 6 weeks ago.  He has started to transition out of the walking boot.  He states that he still has some sharp pains occasionally.  He is doing better overall.  On his way to clinic at this morning, he states he hit his right elbow, and his elbow has been bothering him since.  He did note improvement following the injection, with acute worsening.   Review of Systems: No fevers or chills No numbness or tingling No chest pain No shortness of breath No bowel or bladder dysfunction No GI distress No headaches   Objective: There were no vitals taken for this visit.  Physical Exam:  Alert and oriented.  No acute distress.  Ambulating in a boot.  Right foot without deformity.  Mild swelling to the great toe.  Minimal tenderness.  He tolerates gentle flexion and extension at the IP joint.  Toes warm well-perfused.  Right elbow tenderness over the lateral  epicondyle.  IMAGING: I personally ordered and reviewed the following images:  X-rays of the great toe were obtained in clinic today.  These are compared to available x-rays.  Fracture of the great toe proximal phalanx remains in stable position.  No interval displacement.  No callus formation.  No bony lesions.  No additional injuries.  Impression: Stable right great toe fracture   Oliver Barre, MD 05/16/2023 9:29 AM

## 2023-05-23 ENCOUNTER — Encounter: Payer: Self-pay | Admitting: Internal Medicine

## 2023-05-23 ENCOUNTER — Ambulatory Visit (INDEPENDENT_AMBULATORY_CARE_PROVIDER_SITE_OTHER): Payer: 59 | Admitting: Internal Medicine

## 2023-05-23 VITALS — BP 133/83 | HR 86 | Ht 68.0 in | Wt 222.8 lb

## 2023-05-23 DIAGNOSIS — L209 Atopic dermatitis, unspecified: Secondary | ICD-10-CM | POA: Insufficient documentation

## 2023-05-23 DIAGNOSIS — K130 Diseases of lips: Secondary | ICD-10-CM

## 2023-05-23 DIAGNOSIS — K146 Glossodynia: Secondary | ICD-10-CM | POA: Diagnosis not present

## 2023-05-23 MED ORDER — FLUOCINOLONE ACETONIDE 0.025 % EX CREA: TOPICAL_CREAM | Freq: Two times a day (BID) | CUTANEOUS | 0 refills | Status: DC

## 2023-05-23 NOTE — Assessment & Plan Note (Signed)
Denies using any OTC lip balm except Vaseline Advised to avoid any new products for now Fluocinolone cream prescribed for allergic reaction Maintain adequate hydration Advised to use Orajel for tongue pain Start Niacin 500 mg once daily Needs dental evaluation
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
## 2023-05-23 NOTE — Patient Instructions (Signed)
Please apply Fluocinolone cream over lip area. Okay to apply Orajel for tongue pain.  Please start taking Niacin 500 mg once daily.  Please get dental evaluation done soon.

## 2023-05-28 LAB — HM DIABETES EYE EXAM

## 2023-07-23 ENCOUNTER — Ambulatory Visit: Payer: 59 | Admitting: Internal Medicine

## 2023-07-31 ENCOUNTER — Ambulatory Visit (INDEPENDENT_AMBULATORY_CARE_PROVIDER_SITE_OTHER): Payer: 59 | Admitting: Internal Medicine

## 2023-07-31 ENCOUNTER — Other Ambulatory Visit: Payer: Self-pay | Admitting: Internal Medicine

## 2023-07-31 ENCOUNTER — Encounter: Payer: Self-pay | Admitting: Internal Medicine

## 2023-07-31 VITALS — BP 134/76 | HR 67 | Ht 68.0 in | Wt 220.6 lb

## 2023-07-31 DIAGNOSIS — E782 Mixed hyperlipidemia: Secondary | ICD-10-CM

## 2023-07-31 DIAGNOSIS — F331 Major depressive disorder, recurrent, moderate: Secondary | ICD-10-CM

## 2023-07-31 DIAGNOSIS — I10 Essential (primary) hypertension: Secondary | ICD-10-CM

## 2023-07-31 DIAGNOSIS — Z114 Encounter for screening for human immunodeficiency virus [HIV]: Secondary | ICD-10-CM

## 2023-07-31 DIAGNOSIS — E1165 Type 2 diabetes mellitus with hyperglycemia: Secondary | ICD-10-CM | POA: Diagnosis not present

## 2023-07-31 DIAGNOSIS — Z0001 Encounter for general adult medical examination with abnormal findings: Secondary | ICD-10-CM

## 2023-07-31 DIAGNOSIS — E559 Vitamin D deficiency, unspecified: Secondary | ICD-10-CM

## 2023-07-31 DIAGNOSIS — M7711 Lateral epicondylitis, right elbow: Secondary | ICD-10-CM

## 2023-07-31 DIAGNOSIS — Z7985 Long-term (current) use of injectable non-insulin antidiabetic drugs: Secondary | ICD-10-CM

## 2023-07-31 DIAGNOSIS — Z125 Encounter for screening for malignant neoplasm of prostate: Secondary | ICD-10-CM | POA: Insufficient documentation

## 2023-07-31 MED ORDER — SERTRALINE HCL 50 MG PO TABS
50.0000 mg | ORAL_TABLET | Freq: Every day | ORAL | 3 refills | Status: DC
Start: 2023-07-31 — End: 2023-11-28

## 2023-07-31 MED ORDER — CELECOXIB 200 MG PO CAPS
200.0000 mg | ORAL_CAPSULE | Freq: Every day | ORAL | 2 refills | Status: DC
Start: 2023-07-31 — End: 2024-03-28

## 2023-07-31 MED ORDER — REPATHA SURECLICK 140 MG/ML ~~LOC~~ SOAJ: 140.0000 mg | SUBCUTANEOUS | 2 refills | Status: DC
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
## 2023-07-31 NOTE — Assessment & Plan Note (Signed)
Ordered PSA after discussing its limitations for prostate cancer screening, including false positive results leading to additional investigations. 

## 2023-07-31 NOTE — Assessment & Plan Note (Signed)
Last vitamin D Lab Results  Component Value Date   VD25OH 51.1 11/08/2022   Advised to take Vitamin D 2000 IU QD

## 2023-07-31 NOTE — Assessment & Plan Note (Signed)
 Physical exam as documented. Counseling done  re healthy lifestyle involving commitment to 150 minutes exercise per week, heart healthy diet, and attaining healthy weight.The importance of adequate sleep also discussed. Immunization and cancer screening needs are specifically addressed at this visit.

## 2023-07-31 NOTE — Assessment & Plan Note (Signed)
Lab Results  Component Value Date   HGBA1C 8.2 (A) 04/11/2023   Uncontrolled, but had improved from 10.1 On Mounjaro - has stopped taking it due to fatigue, GI discomfort Lantus 20 U at bedtime now, advised to take 30 U at bedtime for now Followed by Endocrinology Advised to follow diabetic diet Had to stop statin due to severe myalgia with at least 2 different statins- started Praluent, but was not covered - will try to send Repatha Diabetic eye exam: Advised to follow up with Ophthalmology for diabetic eye exam

## 2023-07-31 NOTE — Assessment & Plan Note (Signed)
Pain and swelling of right elbow likely due to lateral epicondylitis Ibuprofen as needed for pain Advised to apply ice Has been evaluated by Orthopedic surgery -had temporary relief with steroid injection, and oral steroids, pain recurred now, currently followed by orthopedic surgeon for fracture of right great toe Started Celebrex 200 mg QD

## 2023-07-31 NOTE — Assessment & Plan Note (Signed)
Lipid profile reviewed - LDL 188 On statin, but does not take it due to leg pain Has tried Crestor, Lipitor and pravastatin - had statin induced myopathy Has constant leg pain/myalgias with them Started Repatha

## 2023-07-31 NOTE — Assessment & Plan Note (Addendum)
Flowsheet Row Office Visit from 05/23/2023 in [facility]  PHQ-9 Total Score 12      Better controlled with Zoloft 50 mg once daily Recent loss of his niece also contributing to worsening of depression Difficulty concentration is likely due to MDD [facility] therapy referral, but he prefers to wait for now

## 2023-07-31 NOTE — Patient Instructions (Addendum)
Please start taking Lantus 30 Units at bedtime.  Please take Celebrex for elbow pain.  Please take Omeprazole as needed for stomach pain.  Please continue to take other medications as prescribed.  Please continue to follow low carb diet and perform moderate exercise/walking at least 150 mins/week.

## 2023-07-31 NOTE — Assessment & Plan Note (Signed)
BP Readings from Last 1 Encounters:  07/31/23 134/76   Well-controlled now with diet modification Advised DASH diet and moderate exercise/walking, at least 150 mins/week

## 2023-08-01 LAB — PSA: Prostate Specific Ag, Serum: 0.4 ng/mL (ref 0.0–4.0)

## 2023-08-01 NOTE — Addendum Note (Signed)
Addended byTrena Platt on: 08/01/2023 07:59 AM   Modules accepted: Orders

## 2023-08-02 LAB — LIPID PANEL
Chol/HDL Ratio: 6.5 {ratio} — ABNORMAL HIGH (ref 0.0–5.0)
Cholesterol, Total: 268 mg/dL — ABNORMAL HIGH (ref 100–199)
HDL: 41 mg/dL (ref >39)
LDL Chol Calc (NIH): 206 mg/dL — ABNORMAL HIGH (ref 0–99)
Triglycerides: 114 mg/dL (ref 0–149)
VLDL Cholesterol Cal: 21 mg/dL (ref 5–40)

## 2023-08-02 LAB — CMP14+EGFR
ALT: 25 [IU]/L (ref 0–44)
AST: 22 [IU]/L (ref 0–40)
Albumin: 4.5 g/dL (ref 3.8–4.9)
Alkaline Phosphatase: 56 [IU]/L (ref 44–121)
BUN/Creatinine Ratio: 12 (ref 9–20)
BUN: 12 mg/dL (ref 6–24)
Bilirubin Total: 0.4 mg/dL (ref 0.0–1.2)
CO2: 26 mmol/L (ref 20–29)
Calcium: 9.8 mg/dL (ref 8.7–10.2)
Chloride: 101 mmol/L (ref 96–106)
Creatinine, Ser: 1.03 mg/dL (ref 0.76–1.27)
Globulin, Total: 2.7 g/dL (ref 1.5–4.5)
Glucose: 262 mg/dL — ABNORMAL HIGH (ref 70–99)
Potassium: 4.2 mmol/L (ref 3.5–5.2)
Sodium: 139 mmol/L (ref 134–144)
Total Protein: 7.2 g/dL (ref 6.0–8.5)
eGFR: 87 mL/min/{1.73_m2} (ref >59)

## 2023-08-02 LAB — CBC WITH DIFFERENTIAL/PLATELET
Basophils Absolute: 0 10*3/uL (ref 0.0–0.2)
Basos: 1 %
EOS (ABSOLUTE): 0.1 10*3/uL (ref 0.0–0.4)
Eos: 2 %
Hematocrit: 40.5 % (ref 37.5–51.0)
Hemoglobin: 13 g/dL (ref 13.0–17.7)
Immature Grans (Abs): 0 10*3/uL (ref 0.0–0.1)
Immature Granulocytes: 0 %
Lymphocytes Absolute: 2.4 10*3/uL (ref 0.7–3.1)
Lymphs: 47 %
MCH: 26.8 pg (ref 26.6–33.0)
MCHC: 32.1 g/dL (ref 31.5–35.7)
MCV: 84 fL (ref 79–97)
Monocytes Absolute: 0.5 10*3/uL (ref 0.1–0.9)
Monocytes: 9 %
Neutrophils Absolute: 2 10*3/uL (ref 1.4–7.0)
Neutrophils: 41 %
Platelets: 262 10*3/uL (ref 150–450)
RBC: 4.85 x10E6/uL (ref 4.14–5.80)
RDW: 13.1 % (ref 11.6–15.4)
WBC: 5 10*3/uL (ref 3.4–10.8)

## 2023-08-02 LAB — TSH: TSH: 3.38 u[IU]/mL (ref 0.450–4.500)

## 2023-08-02 LAB — MICROALBUMIN / CREATININE URINE RATIO
Creatinine, Urine: 173.2 mg/dL
Microalb/Creat Ratio: 7 mg/g{creat} (ref 0–29)
Microalbumin, Urine: 11.8 ug/mL

## 2023-08-02 LAB — HEMOGLOBIN A1C
Est. average glucose Bld gHb Est-mCnc: 229 mg/dL
Hgb A1c MFr Bld: 9.6 % — ABNORMAL HIGH (ref 4.8–5.6)

## 2023-08-02 LAB — VITAMIN D 25 HYDROXY (VIT D DEFICIENCY, FRACTURES): Vit D, 25-Hydroxy: 38.6 ng/mL (ref 30.0–100.0)

## 2023-08-02 LAB — HIV ANTIBODY (ROUTINE TESTING W REFLEX)

## 2023-08-13 ENCOUNTER — Ambulatory Visit: Payer: 59 | Admitting: Nurse Practitioner

## 2023-08-13 DIAGNOSIS — Z7985 Long-term (current) use of injectable non-insulin antidiabetic drugs: Secondary | ICD-10-CM

## 2023-08-13 DIAGNOSIS — I10 Essential (primary) hypertension: Secondary | ICD-10-CM

## 2023-08-13 DIAGNOSIS — Z794 Long term (current) use of insulin: Secondary | ICD-10-CM

## 2023-08-13 DIAGNOSIS — E782 Mixed hyperlipidemia: Secondary | ICD-10-CM

## 2023-08-13 DIAGNOSIS — E1165 Type 2 diabetes mellitus with hyperglycemia: Secondary | ICD-10-CM

## 2023-08-20 ENCOUNTER — Ambulatory Visit (INDEPENDENT_AMBULATORY_CARE_PROVIDER_SITE_OTHER): Payer: 59 | Admitting: Nurse Practitioner

## 2023-08-20 ENCOUNTER — Encounter: Payer: Self-pay | Admitting: Nurse Practitioner

## 2023-08-20 VITALS — BP 134/78 | HR 71 | Ht 68.0 in | Wt 219.0 lb

## 2023-08-20 DIAGNOSIS — E1165 Type 2 diabetes mellitus with hyperglycemia: Secondary | ICD-10-CM | POA: Diagnosis not present

## 2023-08-20 DIAGNOSIS — Z794 Long term (current) use of insulin: Secondary | ICD-10-CM | POA: Diagnosis not present

## 2023-08-20 DIAGNOSIS — E782 Mixed hyperlipidemia: Secondary | ICD-10-CM

## 2023-08-20 DIAGNOSIS — I10 Essential (primary) hypertension: Secondary | ICD-10-CM

## 2023-08-20 DIAGNOSIS — Z7985 Long-term (current) use of injectable non-insulin antidiabetic drugs: Secondary | ICD-10-CM | POA: Diagnosis not present

## 2023-08-20 MED ORDER — BASAGLAR KWIKPEN 100 UNIT/ML ~~LOC~~ SOPN: 30.0000 [IU] | PEN_INJECTOR | Freq: Every day | SUBCUTANEOUS | 3 refills | Status: DC

## 2023-08-20 MED ORDER — FREESTYLE LIBRE 3 PLUS SENSOR MISC: 1 refills | Status: DC

## 2023-08-20 MED ORDER — TIRZEPATIDE 2.5 MG/0.5ML ~~LOC~~ SOAJ: 2.5000 mg | SUBCUTANEOUS | 0 refills | Status: DC
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
## 2023-09-18 ENCOUNTER — Other Ambulatory Visit (HOSPITAL_COMMUNITY): Payer: Self-pay

## 2023-09-18 ENCOUNTER — Telehealth: Payer: Self-pay | Admitting: Pharmacy Technician

## 2023-09-18 NOTE — Telephone Encounter (Signed)
 Pharmacy Patient Advocate Encounter   Received notification from Patient Pharmacy that prior authorization for Praluent 75MG /ML auto-injectors is required/requested.   Insurance verification completed.   The patient is insured through [facility] .   Per test claim: PA required; PA submitted to above mentioned insurance via CoverMyMeds Key/confirmation #/EOC [id number] Status is pending

## 2023-09-19 ENCOUNTER — Other Ambulatory Visit (HOSPITAL_COMMUNITY): Payer: Self-pay

## 2023-09-21 ENCOUNTER — Other Ambulatory Visit (HOSPITAL_COMMUNITY): Payer: Self-pay

## 2023-09-21 NOTE — Telephone Encounter (Signed)
 Pharmacy Patient Advocate Encounter  Received notification from [facility] that Prior Authorization for PRALUENT 75MG /ML AUTO-INJECTORS has been APPROVED from 09/20/2023 to 09/19/2024. Unable to obtain price due to refill too soon rejection, last fill date 09/20/2023 next available fill date04/10/2023.   PA #/Case ID/Reference #: [id number]

## 2023-09-26 ENCOUNTER — Telehealth: Payer: Self-pay

## 2023-09-26 ENCOUNTER — Other Ambulatory Visit (HOSPITAL_COMMUNITY): Payer: Self-pay

## 2023-09-26 NOTE — Telephone Encounter (Signed)
 Pharmacy Patient Advocate Encounter   Received notification from CoverMyMeds that prior authorization for [facility] is required/requested.   Insurance verification completed.   The patient is insured through [facility] .   Per test claim: PA required; PA submitted to above mentioned insurance via CoverMyMeds Key/confirmation #/EOC [id number] Status is pending

## 2023-10-02 NOTE — Telephone Encounter (Signed)
 Pharmacy Patient Advocate Encounter  Received notification from [facility] that Prior Authorization for Travis Delgado has been APPROVED from 10-01-2023 to 09-30-2024   PA #/Case ID/Reference #: [id number]

## 2023-10-03 NOTE — Telephone Encounter (Signed)
 Patient was called and a message was left letting him know that the medication has been approved.

## 2023-11-26 ENCOUNTER — Ambulatory Visit: Payer: 59 | Admitting: Nurse Practitioner

## 2023-11-26 ENCOUNTER — Encounter: Payer: Self-pay | Admitting: Nurse Practitioner

## 2023-11-26 VITALS — BP 102/78 | HR 63 | Ht 68.0 in | Wt 213.8 lb

## 2023-11-26 DIAGNOSIS — E782 Mixed hyperlipidemia: Secondary | ICD-10-CM

## 2023-11-26 DIAGNOSIS — I10 Essential (primary) hypertension: Secondary | ICD-10-CM

## 2023-11-26 DIAGNOSIS — Z794 Long term (current) use of insulin: Secondary | ICD-10-CM | POA: Diagnosis not present

## 2023-11-26 DIAGNOSIS — Z7985 Long-term (current) use of injectable non-insulin antidiabetic drugs: Secondary | ICD-10-CM

## 2023-11-26 DIAGNOSIS — E1165 Type 2 diabetes mellitus with hyperglycemia: Secondary | ICD-10-CM

## 2023-11-26 LAB — POCT GLYCOSYLATED HEMOGLOBIN (HGB A1C): Hemoglobin A1C: 10.6 % — AB (ref 4.0–5.6)

## 2023-11-26 MED ORDER — TIRZEPATIDE 5 MG/0.5ML ~~LOC~~ SOAJ: 5.0000 mg | SUBCUTANEOUS | 1 refills | Status: DC
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
## 2023-11-28 ENCOUNTER — Encounter: Payer: Self-pay | Admitting: Internal Medicine

## 2023-11-28 ENCOUNTER — Ambulatory Visit: Payer: 59 | Admitting: Internal Medicine

## 2023-11-28 VITALS — BP 124/87 | HR 75 | Ht 68.0 in | Wt 211.4 lb

## 2023-11-28 DIAGNOSIS — F331 Major depressive disorder, recurrent, moderate: Secondary | ICD-10-CM | POA: Diagnosis not present

## 2023-11-28 DIAGNOSIS — E1165 Type 2 diabetes mellitus with hyperglycemia: Secondary | ICD-10-CM

## 2023-11-28 DIAGNOSIS — E782 Mixed hyperlipidemia: Secondary | ICD-10-CM

## 2023-11-28 DIAGNOSIS — I10 Essential (primary) hypertension: Secondary | ICD-10-CM

## 2023-11-28 DIAGNOSIS — G720 Drug-induced myopathy: Secondary | ICD-10-CM | POA: Diagnosis not present

## 2023-11-28 MED ORDER — SERTRALINE HCL 25 MG PO TABS: 25.0000 mg | ORAL_TABLET | Freq: Every day | ORAL | 1 refills | Status: DC

## 2023-11-28 MED ORDER — PRALUENT 75 MG/ML ~~LOC~~ SOAJ: 75.0000 mg | SUBCUTANEOUS | 3 refills | Status: AC

## 2023-11-28 NOTE — Patient Instructions (Signed)
 Please start taking Praluent  as prescribed.  Please take Zoloft  25 mg regularly.  Please continue to take medications as prescribed.  Please continue to follow low carb diet and perform moderate exercise/walking at least 150 mins/week.

## 2023-11-28 NOTE — Assessment & Plan Note (Addendum)
 Flowsheet Row Office Visit from 11/28/2023 in [facility]  PHQ-9 Total Score 0      Better controlled with Zoloft , but takes it intermittently Decreased dose of Zoloft  to 25 mg once daily, advised to take it regularly Recent loss of his niece also contributing to worsening of depression Difficulty concentration is likely due to MDD [facility] therapy referral, but he prefers to wait for now
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
## 2023-11-28 NOTE — Assessment & Plan Note (Addendum)
 Lipid profile reviewed - LDL 206 Has tried Crestor, Lipitor and pravastatin - had statin induced myopathy Has constant leg pain/myalgias with them Started Praluent , has been prior authorized, needs to check at pharmacy

## 2023-11-28 NOTE — Assessment & Plan Note (Addendum)
 Lab Results  Component Value Date   HGBA1C 10.6 (A) 11/26/2023   Uncontrolled due to diet and medication noncompliance On Mounjaro  - had stopped taking it  in between due to fatigue, GI discomfort, on Lantus  15 U at bedtime now Followed by Endocrinology Advised to follow diabetic diet Had to stop statin due to severe myalgia with at least 2 different statins- sent Praluent , has been prior authorized, needs to check at pharmacy Diabetic eye exam: Advised to follow up with Ophthalmology for diabetic eye exam

## 2023-11-28 NOTE — Assessment & Plan Note (Signed)
 BP Readings from Last 1 Encounters:  11/28/23 124/87   Well-controlled now with diet modification Advised DASH diet and moderate exercise/walking, at least 150 mins/week

## 2023-11-28 NOTE — Assessment & Plan Note (Signed)
 Has tried Crestor, Lipitor and pravastatin Has constant leg pain/myalgias with them

## 2024-01-14 ENCOUNTER — Encounter: Payer: Self-pay | Admitting: Internal Medicine

## 2024-01-14 ENCOUNTER — Telehealth: Admitting: Internal Medicine

## 2024-01-14 DIAGNOSIS — S30860A Insect bite (nonvenomous) of lower back and pelvis, initial encounter: Secondary | ICD-10-CM | POA: Diagnosis not present

## 2024-01-14 DIAGNOSIS — W57XXXA Bitten or stung by nonvenomous insect and other nonvenomous arthropods, initial encounter: Secondary | ICD-10-CM | POA: Diagnosis not present

## 2024-01-14 MED ORDER — DOXYCYCLINE HYCLATE 100 MG PO TABS: 100.0000 mg | ORAL_TABLET | Freq: Two times a day (BID) | ORAL | 0 refills | Status: DC
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
## 2024-01-14 NOTE — Assessment & Plan Note (Signed)
 Had tick bite on back and abdominal wall on 01/13/24 Has mild swelling and has noticed puslike discharge, concerning for local skin infection Started empiric doxycycline  100 mg BID x 7 days Keep area clean and dry

## 2024-01-15 ENCOUNTER — Encounter: Payer: Self-pay | Admitting: Internal Medicine

## 2024-02-25 ENCOUNTER — Encounter (HOSPITAL_BASED_OUTPATIENT_CLINIC_OR_DEPARTMENT_OTHER): Payer: Self-pay | Admitting: Internal Medicine

## 2024-02-25 ENCOUNTER — Other Ambulatory Visit (HOSPITAL_COMMUNITY): Payer: Self-pay

## 2024-02-25 ENCOUNTER — Ambulatory Visit (INDEPENDENT_AMBULATORY_CARE_PROVIDER_SITE_OTHER): Admitting: Internal Medicine

## 2024-02-25 ENCOUNTER — Telehealth: Payer: Self-pay | Admitting: Pharmacy Technician

## 2024-02-25 ENCOUNTER — Telehealth (HOSPITAL_BASED_OUTPATIENT_CLINIC_OR_DEPARTMENT_OTHER): Payer: Self-pay | Admitting: *Deleted

## 2024-02-25 ENCOUNTER — Encounter (HOSPITAL_BASED_OUTPATIENT_CLINIC_OR_DEPARTMENT_OTHER): Payer: Self-pay | Admitting: *Deleted

## 2024-02-25 VITALS — BP 94/68 | HR 70 | Ht 68.0 in | Wt 211.6 lb

## 2024-02-25 DIAGNOSIS — M791 Myalgia, unspecified site: Secondary | ICD-10-CM | POA: Diagnosis not present

## 2024-02-25 DIAGNOSIS — T466X5D Adverse effect of antihyperlipidemic and antiarteriosclerotic drugs, subsequent encounter: Secondary | ICD-10-CM

## 2024-02-25 DIAGNOSIS — E7800 Pure hypercholesterolemia, unspecified: Secondary | ICD-10-CM | POA: Diagnosis not present

## 2024-02-25 NOTE — Patient Instructions (Signed)
 Medication Instructions:   We will check in with our Pharmacist to see if there is some kind of patient assistance program for Praluent   *If you need a refill on your cardiac medications before your next appointment, please call your pharmacy*   Testing/Procedures:  Genetic testing for Hypercholesteremia, familial (E78.01) has been recommended by Dr. Mona ordered ([facility]--please look into their website [facility] A CLIA Certified and CAP- accredited Laboratory to see if your insurance will approve for you to get genetic testing done by our office    Follow-Up:  TBD-AS NEEDED

## 2024-02-25 NOTE — Telephone Encounter (Signed)
-----   Message from Camelia JONETTA Slade sent at 02/25/2024  9:48 AM EDT ----- Regarding: RE: PT ASSISTANCE FOR PRALUENT  I was able to get copay card which brings the cost down to $50.00.  Patient and pharmacy made aware! Thanks! ----- Message ----- From: Gladis Porter HERO, LPN Sent: [id number]/7974   9:19 AM EDT To: Andriette HERO Eke, RN; Cv Div Pharmd; Rx Med As# Subject: PT ASSISTANCE FOR PRALUENT                      Pt was seen today by Dr. Mona as a new consult for lipid clinic  Dr. Mona wanted to check in with you to see if Praluent  has some sort of pt assistance to offer this pt?   Can you help with this and let the pt know?  Thanks, [employer]

## 2024-02-25 NOTE — Telephone Encounter (Signed)
 Was able to get a praluent  copay card which brings cost to $50.00 monthly- uploaded in [facility] and put here.
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
## 2024-02-29 ENCOUNTER — Encounter: Payer: Self-pay | Admitting: Radiology

## 2024-02-29 NOTE — Addendum Note (Signed)
 Addended by: LORING ANDRIETTE HERO on: 02/29/2024 08:15 AM   Modules accepted: Orders

## 2024-03-06 ENCOUNTER — Encounter (HOSPITAL_BASED_OUTPATIENT_CLINIC_OR_DEPARTMENT_OTHER): Payer: Self-pay | Admitting: Internal Medicine

## 2024-03-06 ENCOUNTER — Telehealth: Payer: Self-pay | Admitting: Internal Medicine

## 2024-03-06 NOTE — Telephone Encounter (Signed)
 MyChart message sent to patient ?genetic test

## 2024-03-06 NOTE — Telephone Encounter (Signed)
 Spoke with patient and advised not sure of the billing process for the testing  Stated Dr Mona had told him to check with his insurance and ask about billing  Explained to patient will forward to his nurse however it may be next week before gets call  back   Will forward to Jenna E RN for review

## 2024-03-06 NOTE — Telephone Encounter (Signed)
 Pt is requesting a callback regarding his MyChart message I talked with the insurance company and they said for you guys to try billing them for the test. Sorry I took so long get back. Also thanks I do have the shots. And the Genetic testing for Hypercholesteremia, familial (E78.01) had been recommended by Dr. Mona. Please advise.

## 2024-03-06 NOTE — Telephone Encounter (Signed)
 See phone note 8/28

## 2024-03-28 ENCOUNTER — Other Ambulatory Visit: Payer: Self-pay | Admitting: Nurse Practitioner

## 2024-03-28 ENCOUNTER — Ambulatory Visit: Admitting: Nurse Practitioner

## 2024-03-28 ENCOUNTER — Other Ambulatory Visit: Payer: Self-pay | Admitting: *Deleted

## 2024-03-28 ENCOUNTER — Encounter: Payer: Self-pay | Admitting: Nurse Practitioner

## 2024-03-28 VITALS — BP 118/70 | HR 63 | Ht 68.0 in | Wt 214.6 lb

## 2024-03-28 DIAGNOSIS — E782 Mixed hyperlipidemia: Secondary | ICD-10-CM | POA: Diagnosis not present

## 2024-03-28 DIAGNOSIS — Z7985 Long-term (current) use of injectable non-insulin antidiabetic drugs: Secondary | ICD-10-CM | POA: Diagnosis not present

## 2024-03-28 DIAGNOSIS — E1165 Type 2 diabetes mellitus with hyperglycemia: Secondary | ICD-10-CM

## 2024-03-28 DIAGNOSIS — I10 Essential (primary) hypertension: Secondary | ICD-10-CM

## 2024-03-28 DIAGNOSIS — Z794 Long term (current) use of insulin: Secondary | ICD-10-CM

## 2024-03-28 LAB — POCT GLYCOSYLATED HEMOGLOBIN (HGB A1C): Hemoglobin A1C: 11.7 % — AB (ref 4.0–5.6)

## 2024-03-28 MED ORDER — BASAGLAR KWIKPEN 100 UNIT/ML ~~LOC~~ SOPN: 20.0000 [IU] | PEN_INJECTOR | Freq: Every day | SUBCUTANEOUS | 3 refills | Status: DC

## 2024-03-28 MED ORDER — FIASP FLEXTOUCH 100 UNIT/ML ~~LOC~~ SOPN: 4.0000 [IU] | PEN_INJECTOR | Freq: Three times a day (TID) | SUBCUTANEOUS | 3 refills | Status: DC

## 2024-03-28 MED ORDER — DEXCOM G7 SENSOR MISC: 1.0000 | 3 refills | Status: DC

## 2024-03-28 MED ORDER — PEN NEEDLES 31G X 6 MM MISC: 6 refills | Status: AC

## 2024-03-28 NOTE — Addendum Note (Signed)
 Addended by: Mickal Meno M on: 03/28/2024 11:46 AM   Modules accepted: Orders
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
## 2024-04-03 ENCOUNTER — Encounter: Payer: Self-pay | Admitting: Internal Medicine

## 2024-04-03 ENCOUNTER — Ambulatory Visit: Admitting: Internal Medicine

## 2024-04-03 VITALS — BP 133/88 | HR 71 | Ht 68.0 in | Wt 214.8 lb

## 2024-04-03 DIAGNOSIS — F331 Major depressive disorder, recurrent, moderate: Secondary | ICD-10-CM

## 2024-04-03 DIAGNOSIS — I10 Essential (primary) hypertension: Secondary | ICD-10-CM | POA: Diagnosis not present

## 2024-04-03 DIAGNOSIS — E1165 Type 2 diabetes mellitus with hyperglycemia: Secondary | ICD-10-CM | POA: Diagnosis not present

## 2024-04-03 DIAGNOSIS — E782 Mixed hyperlipidemia: Secondary | ICD-10-CM

## 2024-04-03 NOTE — Assessment & Plan Note (Signed)
 Lipid profile reviewed - LDL 206 Has tried Crestor, Lipitor and pravastatin - had statin induced myopathy Recently started Praluent , has been prior authorized, needs to continue taking it regularly Check lipid profile after 2 months

## 2024-04-03 NOTE — Assessment & Plan Note (Addendum)
 Lab Results  Component Value Date   HGBA1C 11.7 (A) 03/28/2024   Uncontrolled due to diet and medication noncompliance On Basaglar  20 U at bedtime and needs to start ISS Followed by Endocrinology - last visit note reviewed Advised to follow diabetic diet On Praluent  now Diabetic eye exam: Advised to follow up with Ophthalmology for diabetic eye exam

## 2024-04-03 NOTE — Patient Instructions (Signed)
 Please continue to take medications as prescribed.  Please continue to follow low carb diet and perform moderate exercise/walking at least 150 mins/week.

## 2024-04-03 NOTE — Assessment & Plan Note (Signed)
 Flowsheet Row Office Visit from 04/03/2024 in [facility]  PHQ-9 Total Score 0   Better controlled now DC Zoloft  His difficulty with concentration was likely due to MDD, although DM with hyperglycemia can also affect daily functioning

## 2024-04-03 NOTE — Assessment & Plan Note (Signed)
 BP Readings from Last 1 Encounters:  04/03/24 133/88   Well-controlled now with diet modification Advised DASH diet and moderate exercise/walking, at least 150 mins/week
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
## 2024-05-12 ENCOUNTER — Encounter: Payer: Self-pay | Admitting: Radiology

## 2024-05-21 ENCOUNTER — Emergency Department (HOSPITAL_COMMUNITY)

## 2024-05-21 ENCOUNTER — Other Ambulatory Visit: Payer: Self-pay

## 2024-05-21 ENCOUNTER — Emergency Department (HOSPITAL_COMMUNITY)
Admission: EM | Admit: 2024-05-21 | Discharge: 2024-05-22 | Disposition: A | Discharge status: Home / Self Care | Attending: Emergency Medicine | Admitting: Emergency Medicine

## 2024-05-21 ENCOUNTER — Encounter (HOSPITAL_COMMUNITY): Payer: Self-pay

## 2024-05-21 ENCOUNTER — Encounter: Payer: Self-pay | Admitting: Internal Medicine

## 2024-05-21 DIAGNOSIS — E119 Type 2 diabetes mellitus without complications: Secondary | ICD-10-CM | POA: Diagnosis not present

## 2024-05-21 DIAGNOSIS — Z87891 Personal history of nicotine dependence: Secondary | ICD-10-CM | POA: Diagnosis not present

## 2024-05-21 DIAGNOSIS — R079 Chest pain, unspecified: Secondary | ICD-10-CM | POA: Diagnosis not present

## 2024-05-21 DIAGNOSIS — Y990 Civilian activity done for income or pay: Secondary | ICD-10-CM | POA: Insufficient documentation

## 2024-05-21 DIAGNOSIS — M542 Cervicalgia: Secondary | ICD-10-CM | POA: Diagnosis not present

## 2024-05-21 DIAGNOSIS — I10 Essential (primary) hypertension: Secondary | ICD-10-CM | POA: Diagnosis not present

## 2024-05-21 DIAGNOSIS — S0990XA Unspecified injury of head, initial encounter: Secondary | ICD-10-CM | POA: Diagnosis present

## 2024-05-21 DIAGNOSIS — S060XAA Concussion with loss of consciousness status unknown, initial encounter: Secondary | ICD-10-CM | POA: Insufficient documentation

## 2024-05-21 DIAGNOSIS — W11XXXA Fall on and from ladder, initial encounter: Secondary | ICD-10-CM | POA: Insufficient documentation

## 2024-05-21 MED ORDER — DIPHENHYDRAMINE HCL 50 MG/ML IJ SOLN
25.0000 mg | Freq: Once | INTRAMUSCULAR | Status: AC
  Administered 2024-05-21: 25 mg via INTRAVENOUS
  Filled 2024-05-21: qty 1

## 2024-05-21 MED ORDER — SODIUM CHLORIDE 0.9 % IV BOLUS
1000.0000 mL | Freq: Once | INTRAVENOUS | Status: AC
  Administered 2024-05-22: 1000 mL via INTRAVENOUS

## 2024-05-21 MED ORDER — PROCHLORPERAZINE EDISYLATE 10 MG/2ML IJ SOLN
10.0000 mg | Freq: Once | INTRAMUSCULAR | Status: AC
  Administered 2024-05-21: 10 mg via INTRAVENOUS
  Filled 2024-05-21: qty 2
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
## 2024-05-21 NOTE — ED Triage Notes (Signed)
 Pt to ED from home with c/o falling while trying to pull tarp over a building Friday, pt now c/o headache and right arm/shoulder/back pain. Pt ambulatory to triage.

## 2024-05-21 NOTE — ED Notes (Signed)
 Pt to CT

## 2024-05-22 ENCOUNTER — Telehealth: Payer: Self-pay | Admitting: Internal Medicine

## 2024-05-22 ENCOUNTER — Other Ambulatory Visit: Payer: Self-pay | Admitting: Internal Medicine

## 2024-05-22 DIAGNOSIS — M542 Cervicalgia: Secondary | ICD-10-CM

## 2024-05-22 LAB — CBC
HCT: 38.2 % — ABNORMAL LOW (ref 39.0–52.0)
Hemoglobin: 12.2 g/dL — ABNORMAL LOW (ref 13.0–17.0)
MCH: 26.5 pg (ref 26.0–34.0)
MCHC: 31.9 g/dL (ref 30.0–36.0)
MCV: 82.9 fL (ref 80.0–100.0)
Platelets: 233 K/uL (ref 150–400)
RBC: 4.61 MIL/uL (ref 4.22–5.81)
RDW: 14 % (ref 11.5–15.5)
WBC: 6.8 K/uL (ref 4.0–10.5)
nRBC: 0 % (ref 0.0–0.2)

## 2024-05-22 LAB — BASIC METABOLIC PANEL WITH GFR
Anion gap: 10 (ref 5–15)
BUN: 11 mg/dL (ref 6–20)
CO2: 27 mmol/L (ref 22–32)
Calcium: 9.4 mg/dL (ref 8.9–10.3)
Chloride: 101 mmol/L (ref 98–111)
Creatinine, Ser: 0.95 mg/dL (ref 0.61–1.24)
GFR, Estimated: >60 mL/min (ref >60)
Glucose, Bld: 347 mg/dL — ABNORMAL HIGH (ref 70–99)
Potassium: 4 mmol/L (ref 3.5–5.1)
Sodium: 137 mmol/L (ref 135–145)

## 2024-05-22 LAB — TROPONIN T, HIGH SENSITIVITY: Troponin T High Sensitivity: <15 ng/L (ref 0–19)

## 2024-05-22 MED ORDER — IBUPROFEN 600 MG PO TABS
600.0000 mg | ORAL_TABLET | Freq: Three times a day (TID) | ORAL | 0 refills | Status: AC | PRN
Start: 2024-05-22 — End: ?

## 2024-05-22 NOTE — Telephone Encounter (Signed)
 [facility]

## 2024-05-22 NOTE — Discharge Instructions (Signed)
 You were evaluated in the Emergency Department and after careful evaluation, we did not find any emergent condition requiring admission or further testing in the hospital.  Your exam/testing today is overall reassuring.  Symptoms likely due to a concussion.  Recommend mental and physical rest for the next few days as we discussed.  Please return to the Emergency Department if you experience any worsening of your condition.   Thank you for allowing us  to be a part of your care.

## 2024-05-22 NOTE — Telephone Encounter (Signed)
 Patient wife came by asking if Dr Tobie can send in something in for his headaches from the concussion said the ER gave him nothing but regular tylenol .

## 2024-05-26 ENCOUNTER — Ambulatory Visit: Admitting: Internal Medicine

## 2024-05-26 ENCOUNTER — Encounter: Payer: Self-pay | Admitting: Internal Medicine

## 2024-05-26 VITALS — BP 124/84 | HR 67 | Ht 68.0 in | Wt 216.2 lb

## 2024-05-26 DIAGNOSIS — S060X9D Concussion with loss of consciousness of unspecified duration, subsequent encounter: Secondary | ICD-10-CM | POA: Diagnosis not present

## 2024-05-26 DIAGNOSIS — S060X9A Concussion with loss of consciousness of unspecified duration, initial encounter: Secondary | ICD-10-CM | POA: Insufficient documentation

## 2024-05-26 DIAGNOSIS — Z09 Encounter for follow-up examination after completed treatment for conditions other than malignant neoplasm: Secondary | ICD-10-CM

## 2024-05-26 DIAGNOSIS — J019 Acute sinusitis, unspecified: Secondary | ICD-10-CM

## 2024-05-26 DIAGNOSIS — W109XXD Fall (on) (from) unspecified stairs and steps, subsequent encounter: Secondary | ICD-10-CM

## 2024-05-26 DIAGNOSIS — M542 Cervicalgia: Secondary | ICD-10-CM

## 2024-05-26 MED ORDER — AZITHROMYCIN 250 MG PO TABS: ORAL_TABLET | ORAL | 0 refills | Status: AC

## 2024-05-26 MED ORDER — OXYCODONE-ACETAMINOPHEN 5-325 MG PO TABS: 1.0000 | ORAL_TABLET | Freq: Four times a day (QID) | ORAL | 0 refills | Status: AC | PRN

## 2024-05-26 MED ORDER — CYCLOBENZAPRINE HCL 5 MG PO TABS: 5.0000 mg | ORAL_TABLET | Freq: Two times a day (BID) | ORAL | 1 refills | Status: AC | PRN

## 2024-05-26 NOTE — Assessment & Plan Note (Addendum)
 Likely has ethmoidal sinusitis Started empiric azithromycin Advised to use Flonase for nasal congestion/allergies Advised to use vaporizer and/or sinus inhaler as needed for nasal congestion

## 2024-05-26 NOTE — Assessment & Plan Note (Signed)
 Due to mechanical fall at home CT head and CT cervical spine reviewed Work note extended for 1 week as he still has severe pain and fatigue

## 2024-05-26 NOTE — Assessment & Plan Note (Signed)
 ER chart reviewed, including imaging He had mechanical fall at home, had confusion for few seconds likely due to direct impact injury/concussion

## 2024-05-26 NOTE — Assessment & Plan Note (Signed)
 Likely muscular in etiology Flexeril  as needed for muscle spasms Ibuprofen  as needed for mild to moderate pain Percocet as needed only for severe pain (reports intolerance to tramadol) Heating pad as needed for local relief
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
## 2024-05-26 NOTE — Patient Instructions (Signed)
 Please take Tylenol  or Ibuprofen  as needed for mild-moderate pain. Please take Percocet as needed for severe pain.  Please take Flexeril  as needed for neck muscle stiffness/spasms.  Please start taking Azithromycin as prescribed for sinusitis. Please use nasal saline spray as needed for dry nostrils.

## 2024-05-26 NOTE — Assessment & Plan Note (Addendum)
 On 05/18/24 Was evaluated in ER on 05/21/24, CT head and CT cervical spine did not show acute bleeding or fracture His neck pain likely due to muscular strain -ibuprofen  and Percocet as needed for pain, Flexeril  as needed for muscle stiffness/spasms Advised to maintain adequate hydration Avoid sudden positional changes

## 2024-06-27 ENCOUNTER — Ambulatory Visit: Admitting: Nurse Practitioner

## 2024-07-30 ENCOUNTER — Other Ambulatory Visit: Payer: Self-pay | Admitting: Nurse Practitioner

## 2024-07-30 DIAGNOSIS — Z7985 Long-term (current) use of injectable non-insulin antidiabetic drugs: Secondary | ICD-10-CM

## 2024-07-30 DIAGNOSIS — Z794 Long term (current) use of insulin: Secondary | ICD-10-CM

## 2024-07-30 DIAGNOSIS — E1165 Type 2 diabetes mellitus with hyperglycemia: Secondary | ICD-10-CM

## 2024-07-31 ENCOUNTER — Other Ambulatory Visit: Payer: Self-pay | Admitting: *Deleted

## 2024-07-31 DIAGNOSIS — Z794 Long term (current) use of insulin: Secondary | ICD-10-CM

## 2024-07-31 DIAGNOSIS — E1165 Type 2 diabetes mellitus with hyperglycemia: Secondary | ICD-10-CM

## 2024-07-31 DIAGNOSIS — Z7985 Long-term (current) use of injectable non-insulin antidiabetic drugs: Secondary | ICD-10-CM

## 2024-07-31 MED ORDER — BASAGLAR KWIKPEN 100 UNIT/ML ~~LOC~~ SOPN: 20.0000 [IU] | PEN_INJECTOR | Freq: Every day | SUBCUTANEOUS | 3 refills | Status: DC

## 2024-07-31 MED ORDER — BASAGLAR KWIKPEN 100 UNIT/ML ~~LOC~~ SOPN: 20.0000 [IU] | PEN_INJECTOR | Freq: Every day | SUBCUTANEOUS | 1 refills | Status: AC

## 2024-07-31 MED ORDER — FIASP FLEXTOUCH 100 UNIT/ML ~~LOC~~ SOPN: 4.0000 [IU] | PEN_INJECTOR | Freq: Three times a day (TID) | SUBCUTANEOUS | 3 refills | Status: AC

## 2024-07-31 NOTE — Telephone Encounter (Signed)
 Patient's wife called. Left a message. She states that her husband has been without his insulin  for 2  days. He needs his Basaglar . His last follow up appointment was canceled due to provider was not going to be in the office.  The patient is to be injecting Basaglar  20 units at night, and Fiasp  4-7 units three times a day per sliding scale before meals.  Prescriptions will be sent in for the Basaglar  and Fiasp .

## 2024-08-07 ENCOUNTER — Encounter: Payer: Self-pay | Admitting: Nurse Practitioner

## 2024-08-07 ENCOUNTER — Ambulatory Visit: Admitting: Nurse Practitioner

## 2024-08-07 VITALS — BP 116/76 | HR 73 | Ht 68.0 in | Wt 217.0 lb

## 2024-08-07 DIAGNOSIS — E782 Mixed hyperlipidemia: Secondary | ICD-10-CM

## 2024-08-07 DIAGNOSIS — I10 Essential (primary) hypertension: Secondary | ICD-10-CM

## 2024-08-07 DIAGNOSIS — E1165 Type 2 diabetes mellitus with hyperglycemia: Secondary | ICD-10-CM | POA: Diagnosis not present

## 2024-08-07 DIAGNOSIS — Z794 Long term (current) use of insulin: Secondary | ICD-10-CM | POA: Diagnosis not present

## 2024-08-07 DIAGNOSIS — Z7985 Long-term (current) use of injectable non-insulin antidiabetic drugs: Secondary | ICD-10-CM | POA: Diagnosis not present

## 2024-08-07 LAB — POCT UA - MICROALBUMIN
Creatinine, POC: 300 mg/dL
Microalbumin Ur, POC: 80 mg/L

## 2024-08-07 LAB — POCT GLYCOSYLATED HEMOGLOBIN (HGB A1C): Hemoglobin A1C: 12.9 % — AB (ref 4.0–5.6)

## 2024-08-07 MED ORDER — DEXCOM G7 SENSOR MISC: 1.0000 | 3 refills | Status: AC
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
## 2024-09-02 ENCOUNTER — Ambulatory Visit: Admitting: Internal Medicine

## 2024-11-07 ENCOUNTER — Ambulatory Visit: Admitting: Nurse Practitioner
